# Patient Record
Sex: Female | Born: 1950 | Race: White | Hispanic: No | Marital: Single | Smoking: Former smoker
Health system: Southern US, Community
[De-identification: ages and names within clinical notes are randomized; demographics above are authoritative.]

## PROBLEM LIST (undated history)

## (undated) DIAGNOSIS — N200 Calculus of kidney: Secondary | ICD-10-CM

## (undated) DIAGNOSIS — E785 Hyperlipidemia, unspecified: Secondary | ICD-10-CM

## (undated) DIAGNOSIS — D649 Anemia, unspecified: Secondary | ICD-10-CM

## (undated) DIAGNOSIS — I341 Nonrheumatic mitral (valve) prolapse: Secondary | ICD-10-CM

## (undated) DIAGNOSIS — Z87442 Personal history of urinary calculi: Secondary | ICD-10-CM

## (undated) DIAGNOSIS — I10 Essential (primary) hypertension: Secondary | ICD-10-CM

## (undated) DIAGNOSIS — N189 Chronic kidney disease, unspecified: Secondary | ICD-10-CM

## (undated) DIAGNOSIS — R011 Cardiac murmur, unspecified: Secondary | ICD-10-CM

## (undated) DIAGNOSIS — Z923 Personal history of irradiation: Secondary | ICD-10-CM

## (undated) DIAGNOSIS — T7840XA Allergy, unspecified, initial encounter: Secondary | ICD-10-CM

## (undated) DIAGNOSIS — H269 Unspecified cataract: Secondary | ICD-10-CM

## (undated) DIAGNOSIS — C801 Malignant (primary) neoplasm, unspecified: Secondary | ICD-10-CM

## (undated) DIAGNOSIS — M810 Age-related osteoporosis without current pathological fracture: Secondary | ICD-10-CM

## (undated) HISTORY — DX: Calculus of kidney: N20.0

## (undated) HISTORY — DX: Chronic kidney disease, unspecified: N18.9

## (undated) HISTORY — PX: SHOULDER SURGERY: SHX246

## (undated) HISTORY — DX: Unspecified cataract: H26.9

## (undated) HISTORY — DX: Allergy, unspecified, initial encounter: T78.40XA

## (undated) HISTORY — DX: Anemia, unspecified: D64.9

## (undated) HISTORY — PX: COLONOSCOPY W/ POLYPECTOMY: SHX1380

## (undated) HISTORY — DX: Hyperlipidemia, unspecified: E78.5

## (undated) HISTORY — PX: CYSTOSCOPY: SUR368

## (undated) HISTORY — DX: Age-related osteoporosis without current pathological fracture: M81.0

## (undated) HISTORY — DX: Essential (primary) hypertension: I10

---

## 1998-12-06 ENCOUNTER — Other Ambulatory Visit: Admission: RE | Admit: 1998-12-06 | Discharge: 1998-12-06 | Payer: Self-pay | Admitting: Internal Medicine

## 1999-12-18 ENCOUNTER — Encounter: Payer: Self-pay | Admitting: Internal Medicine

## 1999-12-18 ENCOUNTER — Encounter: Admission: RE | Admit: 1999-12-18 | Discharge: 1999-12-18 | Payer: Self-pay | Admitting: Internal Medicine

## 2000-01-18 ENCOUNTER — Other Ambulatory Visit: Admission: RE | Admit: 2000-01-18 | Discharge: 2000-01-18 | Payer: Self-pay | Admitting: Internal Medicine

## 2000-01-22 ENCOUNTER — Encounter: Payer: Self-pay | Admitting: Internal Medicine

## 2000-01-22 ENCOUNTER — Ambulatory Visit (HOSPITAL_COMMUNITY): Admission: RE | Admit: 2000-01-22 | Discharge: 2000-01-22 | Payer: Self-pay | Admitting: Internal Medicine

## 2000-11-19 ENCOUNTER — Encounter: Payer: Self-pay | Admitting: Orthopedic Surgery

## 2000-11-25 ENCOUNTER — Ambulatory Visit (HOSPITAL_COMMUNITY): Admission: AD | Admit: 2000-11-25 | Discharge: 2000-11-26 | Payer: Self-pay | Admitting: Orthopedic Surgery

## 2000-12-18 ENCOUNTER — Encounter: Admission: RE | Admit: 2000-12-18 | Discharge: 2000-12-18 | Payer: Self-pay | Admitting: Internal Medicine

## 2000-12-18 ENCOUNTER — Encounter: Payer: Self-pay | Admitting: Internal Medicine

## 2000-12-24 ENCOUNTER — Other Ambulatory Visit: Admission: RE | Admit: 2000-12-24 | Discharge: 2000-12-24 | Payer: Self-pay | Admitting: Internal Medicine

## 2001-12-21 ENCOUNTER — Encounter: Admission: RE | Admit: 2001-12-21 | Discharge: 2001-12-21 | Payer: Self-pay | Admitting: Internal Medicine

## 2001-12-21 ENCOUNTER — Encounter: Payer: Self-pay | Admitting: Internal Medicine

## 2001-12-25 ENCOUNTER — Other Ambulatory Visit: Admission: RE | Admit: 2001-12-25 | Discharge: 2001-12-25 | Payer: Self-pay | Admitting: Internal Medicine

## 2002-12-29 ENCOUNTER — Encounter: Admission: RE | Admit: 2002-12-29 | Discharge: 2002-12-29 | Payer: Self-pay | Admitting: Internal Medicine

## 2002-12-29 ENCOUNTER — Encounter: Payer: Self-pay | Admitting: Internal Medicine

## 2003-11-18 ENCOUNTER — Encounter (INDEPENDENT_AMBULATORY_CARE_PROVIDER_SITE_OTHER): Payer: Self-pay | Admitting: *Deleted

## 2003-11-18 ENCOUNTER — Ambulatory Visit (HOSPITAL_COMMUNITY): Admission: RE | Admit: 2003-11-18 | Discharge: 2003-11-18 | Payer: Self-pay | Admitting: *Deleted

## 2003-11-18 ENCOUNTER — Encounter (INDEPENDENT_AMBULATORY_CARE_PROVIDER_SITE_OTHER): Payer: Self-pay | Admitting: Specialist

## 2003-12-15 ENCOUNTER — Other Ambulatory Visit: Admission: RE | Admit: 2003-12-15 | Discharge: 2003-12-29 | Payer: Self-pay | Admitting: Internal Medicine

## 2004-01-10 ENCOUNTER — Ambulatory Visit (HOSPITAL_COMMUNITY): Admission: RE | Admit: 2004-01-10 | Discharge: 2004-01-10 | Payer: Self-pay | Admitting: Internal Medicine

## 2005-01-15 ENCOUNTER — Ambulatory Visit (HOSPITAL_COMMUNITY): Admission: RE | Admit: 2005-01-15 | Discharge: 2005-01-15 | Payer: Self-pay | Admitting: Internal Medicine

## 2005-06-14 ENCOUNTER — Ambulatory Visit (HOSPITAL_COMMUNITY): Admission: RE | Admit: 2005-06-14 | Discharge: 2005-06-14 | Payer: Self-pay | Admitting: *Deleted

## 2005-06-14 ENCOUNTER — Encounter (INDEPENDENT_AMBULATORY_CARE_PROVIDER_SITE_OTHER): Payer: Self-pay | Admitting: *Deleted

## 2005-06-14 ENCOUNTER — Encounter (INDEPENDENT_AMBULATORY_CARE_PROVIDER_SITE_OTHER): Payer: Self-pay | Admitting: Specialist

## 2006-01-16 ENCOUNTER — Ambulatory Visit (HOSPITAL_COMMUNITY): Admission: RE | Admit: 2006-01-16 | Discharge: 2006-01-16 | Payer: Self-pay | Admitting: Internal Medicine

## 2006-01-27 ENCOUNTER — Encounter: Admission: RE | Admit: 2006-01-27 | Discharge: 2006-01-27 | Payer: Self-pay | Admitting: Internal Medicine

## 2007-01-16 ENCOUNTER — Other Ambulatory Visit: Admission: RE | Admit: 2007-01-16 | Discharge: 2007-01-16 | Payer: Self-pay | Admitting: Internal Medicine

## 2007-01-23 ENCOUNTER — Encounter: Admission: RE | Admit: 2007-01-23 | Discharge: 2007-01-23 | Payer: Self-pay | Admitting: Internal Medicine

## 2007-01-30 ENCOUNTER — Ambulatory Visit (HOSPITAL_COMMUNITY): Admission: RE | Admit: 2007-01-30 | Discharge: 2007-01-30 | Payer: Self-pay | Admitting: Internal Medicine

## 2008-02-16 ENCOUNTER — Ambulatory Visit (HOSPITAL_COMMUNITY): Admission: RE | Admit: 2008-02-16 | Discharge: 2008-02-16 | Payer: Self-pay | Admitting: Internal Medicine

## 2009-02-16 ENCOUNTER — Ambulatory Visit (HOSPITAL_COMMUNITY): Admission: RE | Admit: 2009-02-16 | Discharge: 2009-02-16 | Payer: Self-pay | Admitting: Internal Medicine

## 2009-10-31 ENCOUNTER — Ambulatory Visit: Payer: Self-pay | Admitting: Internal Medicine

## 2009-10-31 DIAGNOSIS — K219 Gastro-esophageal reflux disease without esophagitis: Secondary | ICD-10-CM | POA: Insufficient documentation

## 2009-11-03 ENCOUNTER — Ambulatory Visit: Payer: Self-pay | Admitting: Internal Medicine

## 2009-11-07 ENCOUNTER — Encounter: Payer: Self-pay | Admitting: Internal Medicine

## 2009-11-08 ENCOUNTER — Encounter: Payer: Self-pay | Admitting: Internal Medicine

## 2010-02-20 ENCOUNTER — Ambulatory Visit (HOSPITAL_COMMUNITY): Admission: RE | Admit: 2010-02-20 | Discharge: 2010-02-20 | Payer: Self-pay | Admitting: Internal Medicine

## 2010-07-22 ENCOUNTER — Encounter: Payer: Self-pay | Admitting: Internal Medicine

## 2010-08-02 NOTE — Letter (Signed)
Summary: EGD Instructions  [facility]  [address] [place], [place] [address]   Phone: [phone]  Fax: [phone]       Christina Sexton    [birth year]    MRN: [id number]       Procedure Day /Date: 11/03/09 Friday     Arrival Time: 2:30 pm     Procedure Time: 3:30 pm     Location of Procedure:                    _ x _  [facility] Center (4th Floor)  PREPARATION FOR ENDOSCOPY   On 11/03/09 THE DAY OF THE PROCEDURE:  1.   No solid foods, milk or milk products are allowed after midnight the night before your procedure.  2.   Do not drink anything colored red or purple.  Avoid juices with pulp.  No orange juice.  3.  You may drink clear liquids until 1:30 pm, which is 2 hours before your procedure.                                                                                                CLEAR LIQUIDS INCLUDE: Water Jello Ice Popsicles Tea (sugar ok, no milk/cream) Powdered fruit flavored drinks Coffee (sugar ok, no milk/cream) Gatorade Juice: apple, white grape, white cranberry  Lemonade Clear bullion, consomm, broth Carbonated beverages (any kind) Strained chicken noodle soup Hard Candy   MEDICATION INSTRUCTIONS  Unless otherwise instructed, you should take regular prescription medications with a small sip of water as early as possible the morning of your procedure.                  OTHER INSTRUCTIONS  You will need a responsible adult at least 59 years of age to accompany you and drive you home.   This person must remain in the waiting room during your procedure.  Wear loose fitting clothing that is easily removed.  Leave jewelry and other valuables at home.  However, you may wish to bring a book to read or an iPod/MP3 player to listen to music as you wait for your procedure to start.  Remove all body piercing jewelry and leave at home.  Total time from sign-in until discharge is approximately 2-3 hours.  You should go  home directly after your procedure and rest.  You can resume normal activities the day after your procedure.  The day of your procedure you should not:   Drive   Make legal decisions   Operate machinery   Drink alcohol   Return to work  You will receive specific instructions about eating, activities and medications before you leave.    The above instructions have been reviewed and explained to me by  Lamona Curl CMA Duncan Dull)  Oct 31, 2009 1:52 PM     I fully understand and can verbalize these instructions _____________________________ Date 10/31/09

## 2010-08-02 NOTE — Procedures (Signed)
Summary: Upper Endoscopy  Patient: Christina Sexton Note: All result statuses are Final unless otherwise noted.  Tests: (1) Upper Endoscopy (EGD)   EGD Upper Endoscopy       DONE     [facility]     [address]     [place], [place]  [address]           ENDOSCOPY PROCEDURE REPORT           PATIENT:  Christina Sexton, Christina Sexton  MR#:  [id number]     BIRTHDATE:  [birth year], 58 yrs. old  GENDER:  female           ENDOSCOPIST:  Hedwig Morton. Juanda Chance, MD     Referred by:  Soyla Murphy. Renne Crigler, M.D.           PROCEDURE DATE:  11/03/2009     PROCEDURE:  EGD with biopsy     ASA CLASS:  Class I     INDICATIONS:  h/o Barrett's Esophagus 10/2003 Barrett's, EGD 2006     no Barrett's,     now burning in her throat, off PPI's           MEDICATIONS:   Versed 6 mg, Fentanyl 75 mcg     TOPICAL ANESTHETIC:  Exactacain Spray           DESCRIPTION OF PROCEDURE:   After the risks benefits and     alternatives of the procedure were thoroughly explained, informed     consent was obtained.  The [employer] GIF-H180 [id number] endoscope was     introduced through the mouth and advanced to the second portion of     the duodenum, without limitations.  The instrument was slowly     withdrawn as the mucosa was fully examined.     <<PROCEDUREIMAGES>>           irregular Z-line. With standard forceps, a biopsy was obtained and     sent to pathology (see image1, image8, image9, and image7).  Mild     gastritis was found. With standard forceps, a biopsy was obtained     and sent to pathology (see image6, image5, image4, and image3).     soft nodular folds  Otherwise the examination was normal. small     bowl biopsies With standard forceps, a biopsy was obtained and     sent to pathology (see image2).    Retroflexed views revealed no     abnormalities.    The scope was then withdrawn from the patient     and the procedure completed.           COMPLICATIONS:  None           ENDOSCOPIC IMPRESSION:     1) Irregular Z-line     2)  Mild gastritis     3) Otherwise normal examination     RECOMMENDATIONS:     1) Await biopsy results     Nexiem 40 mg 1 po qd, #30, 3 refills           REPEAT EXAM:  In 2 year(s) for.  2 years for recall if Barrett's     found           ______________________________     Hedwig Morton. Juanda Chance, MD           CC:           n.     eSIGNED:   Hedwig Morton. Tito Ausmus at 11/03/2009 03:55 PM  Christina Sexton, Christina Sexton, [id number]  Note: An exclamation mark (!) indicates a result that was not dispersed into the flowsheet. Document Creation Date: 11/03/2009 3:56 PM _______________________________________________________________________  (1) Order result status: Final Collection or observation date-time: 11/03/2009 15:43 Requested date-time:  Receipt date-time:  Reported date-time:  Referring Physician:   Ordering Physician: Lina Sar [phone]) Specimen Source:  Source: Launa Grill Order Number: [id number] Lab site:

## 2010-08-02 NOTE — Op Note (Signed)
Summary: EGD (Dr Virginia Rochester)  NAME:  Christina Sexton, Christina Sexton                        ACCOUNT NO.:  [id number]   MEDICAL RECORD NO.:  [id number]                   PATIENT TYPE:  AMB   LOCATION:  ENDO                                 FACILITY:  [facility]   PHYSICIAN:  Georgiana Spinner, M.D.                 DATE OF BIRTH:  [birth year]   DATE OF PROCEDURE:  11/18/2003  DATE OF DISCHARGE:                                 OPERATIVE REPORT   PROCEDURE:  Upper endoscopy with biopsy.   INDICATIONS:  GERD.   ANESTHESIA:  Demerol 60 mg, Versed \\78  mg.   DESCRIPTION OF PROCEDURE:  With the patient mildly sedated in the left  lateral decubitus position, the Olympus videoscopic endoscope was inserted  in the mouth, passed under direct vision through the esophagus, which  appeared normal.  As we approached the distal esophagus, Z-line became  apparent, and we photographed and biopsied this to rule out Barrett's  esophagus.  The endoscope was advanced into the stomach.  Fundus, body,  antrum, duodenal bulb, second portion of duodenum were visualized.  From  this point, the endoscope was slowly withdrawn, taking circumferential views  of the duodenal mucosa until the endoscope then pulled back into the  stomach, placed in retroflexion to view the stomach from below, and a laxity  of the GE junction was noted and photographed.  The endoscope was  straightened and withdrawn, taking circumferential views of the remaining  gastric and esophageal mucosa, stopping in the fundus of the stomach which  appeared somewhat velvety in appearance and was photographed and biopsied.  The endoscope was withdrawn, taking circumferential views of the remaining  gastric and esophageal mucosa.  The patient's vital signs and pulse oximeter  remained stable.  The patient tolerated the procedure well without apparent  complications.   FINDINGS:  1. Questionable changes of Barrett's esophagus in the distal esophagus.  2. Changes of fundus.  3. Await biopsy report.  The patient will call me for results and follow up     with me as an outpatient.  4. Proceed to colonoscopy as planned.                                               Georgiana Spinner, M.D.    GMO/MEDQ  D:  11/18/2003  T:  11/18/2003  Job:  [id number]

## 2010-08-02 NOTE — Op Note (Signed)
Summary: COLON (Dr Virginia Rochester)  NAME:  Christina Sexton, Christina Sexton                        ACCOUNT NO.:  [id number]   MEDICAL RECORD NO.:  [id number]                   PATIENT TYPE:  AMB   LOCATION:  ENDO                                 FACILITY:  [facility]   PHYSICIAN:  Georgiana Spinner, M.D.                 DATE OF BIRTH:  01-26-1951   DATE OF PROCEDURE:  11/18/2003  DATE OF DISCHARGE:                                 OPERATIVE REPORT   PROCEDURE:  Colonoscopy.   INDICATIONS FOR PROCEDURE:  Colon cancer screening.   ANESTHESIA:  Demerol 40, Versed 1 mg.   DESCRIPTION OF PROCEDURE:  With the patient mildly sedated in the left  lateral decubitus position, the Olympus videoscopic pediatric colonoscope  PCF-100 was inserted in the rectum and passed under direct vision through a  tortuous distal colon. With pressure applied, we reached the cecum  identified by the ileocecal valve and appendiceal orifice both of which were  photographed. From this point, the colonoscope was slowly withdrawn taking  circumferential views of the colonic mucosa stopping only in the rectum  which appeared normal on direct showed hemorrhoids on retroflexed view. The  endoscope was straightened and withdrawn. The patient's vital signs and  pulse oximeter remained stable. The patient tolerated the procedure well  without apparent complications.   FINDINGS:  Internal hemorrhoids otherwise unremarkable but tortuous colon.   PLAN:  Consider repeat examination in 5-10 years.                                               Georgiana Spinner, M.D.    GMO/MEDQ  D:  11/18/2003  T:  11/18/2003  Job:  [id number]

## 2010-08-02 NOTE — Op Note (Signed)
Summary: EGD   NAME:  Christina Sexton, Christina Sexton              ACCOUNT NO.:  [id number]   MEDICAL RECORD NO.:  [id number]          PATIENT TYPE:  AMB   LOCATION:  ENDO                         FACILITY:  [facility]   PHYSICIAN:  Georgiana Spinner, M.D.    DATE OF BIRTH:  December 26, 1950   DATE OF PROCEDURE:  06/14/2005  DATE OF DISCHARGE:                                 OPERATIVE REPORT   PROCEDURE:  Upper endoscopy.   INDICATIONS:  GERD with Barrett's esophagus.   ANESTHESIA:  Demerol 50, Versed 5 mg.   PROCEDURE:  With the patient mildly sedated in the left lateral decubitus  position, the Olympus videoscopic endoscope was inserted in the mouth and  passed under direct vision through the esophagus which appeared normal until  we reached distal esophagus, and there were changes of Barrett's,  photographed and biopsied.  We entered into the stomach.  Fundus, body,  antrum, duodenal bulb, second portion of duodenum were visualized.  From  this point, the endoscope was slowly withdrawn taking circumferential views  of duodenal mucosa until the endoscope had been pulled back in the stomach,  placed in retroflexion to view the stomach from below.  The endoscope was  straightened and withdrawn, taking circumferential views of the remaining  gastric and esophageal mucosa.  The patient's vital signs and pulse oximeter  remained stable.  The patient tolerated the procedure well without apparent  complications.   FINDINGS:  Barrett's esophagus, biopsied, await biopsy report.  The patient  will call me for results and follow up with me as an outpatient.           ______________________________  Georgiana Spinner, M.D.     GMO/MEDQ  D:  06/14/2005  T:  06/15/2005  Job:  [id number]     FINAL DIAGNOSIS    ***MICROSCOPIC EXAMINATION AND DIAGNOSIS***    ESOPHAGUS, BIOPSIES: GASTROESOPHAGEAL JUNCTION MUCOSA WITH MILD   INFLAMMATION CONSISTENT WITH GASTROESOPHAGEAL REFLUX. NO   INTESTINAL METAPLASIA, DYSPLASIA OR  MALIGNANCY IDENTIFIED.    COMMENT   An Alcian Blue stain is performed to determine the presence of   intestinal metaplasia (goblet cell metaplasia). No intestinal   metaplasia (goblet cell metaplasia) is identified with the Alcian   Blue stain. The control stained appropriately. (JDP:glk   06-17-05)    gk   Date Reported: 06/17/2005 Beulah Gandy. Luisa Hart, MD   *** Electronically Signed Out By JDP ***    Clinical information   History of Barrett' s. (hd)    specimen(s) obtained   Esophagus, biopsy    Gross Description   Received in formalin are tan, soft tissue fragments that are   submitted in toto. Number: 5   Size: 0.1 to 0.2 cm (JBM:kcv 06-14-05)    kv/

## 2010-08-02 NOTE — Assessment & Plan Note (Signed)
Summary: burning in her throat..em   History of Present Illness Visit Type: Initial Visit Primary GI MD: Lina Sar MD Primary Provider: Merri Brunette, MD Chief Complaint: Burning in throat x 2 months off/on History of Present Illness:   This is a 60 year old white female with chronic gastroesophageal reflux and a history of Barrett's esophagus found on an upper endoscopy in May 2005 by Dr Virginia Rochester. A repeat upper endoscopy on 08/15/04 did not show any evidence of Barrett's esophagus but rather showed reflux esophagitis. Patient has discontinued all acid reducers and has been experiencing burning in the back of her throat. Her symptoms are somewhat different than the chest pain she was having in the past. She denies dysphagia but has occasional hoarseness. She denies coughing. Most of the symptoms occurr during the day. Her weight has been stable. She does not smoke and does not drink alcohol. She drinks only one cup of coffee per day.   GI Review of Systems    Reports acid reflux and  heartburn.      Denies abdominal pain, belching, bloating, chest pain, dysphagia with liquids, dysphagia with solids, loss of appetite, nausea, vomiting, vomiting blood, weight loss, and  weight gain.        Denies anal fissure, black tarry stools, change in bowel habit, constipation, diarrhea, diverticulosis, fecal incontinence, heme positive stool, hemorrhoids, irritable bowel syndrome, jaundice, light color stool, liver problems, rectal bleeding, and  rectal pain. Preventive Screening-Counseling & Management  Alcohol-Tobacco     Smoking Status: never  Caffeine-Diet-Exercise     Does Patient Exercise: yes      Drug Use:  no.      Current Medications (verified): 1)  None  Allergies (verified): No Known Drug Allergies  Past History:  Past Medical History: Barretts Esophagus GERD Arrhythmia Kidney Stones  Past Surgical History: Shoulder Surgery  Family History: No FH of Colon Cancer: Family  History of Diabetes: Father Family History of Heart Disease: Father Family History of Kidney Disease:Brother  Social History: Occupation: [employer] Patient has never smoked.  Alcohol Use - no Daily Caffeine Use Illicit Drug Use - no Patient gets regular exercise. Smoking Status:  never Drug Use:  no Does Patient Exercise:  yes  Review of Systems       The patient complains of allergy/sinus, anxiety-new, heart rhythm changes, night sweats, and sore throat.  The patient denies anemia, arthritis/joint pain, back pain, blood in urine, breast changes/lumps, change in vision, confusion, cough, coughing up blood, depression-new, fainting, fatigue, fever, headaches-new, hearing problems, heart murmur, itching, menstrual pain, muscle pains/cramps, nosebleeds, pregnancy symptoms, shortness of breath, skin rash, sleeping problems, swelling of feet/legs, swollen lymph glands, thirst - excessive, urination - excessive, urination changes/pain, urine leakage, vision changes, and voice change.         Pertinent positive and negative review of systems were noted in the above HPI. All other ROS was otherwise negative.   Vital Signs:  Patient profile:   60 year old female Height:      63 inches Weight:      103 pounds BMI:     18.31 Pulse rate:   80 / minute Pulse rhythm:   regular BP sitting:   146 / 82  (left arm) Cuff size:   regular  Vitals Entered By: June McMurray CMA Duncan Dull) (Oct 31, 2009 1:19 PM)  Physical Exam  General:  Well developed, well nourished, no acute distress. Eyes:  PERRLA, no icterus. Mouth:  No deformity or lesions, dentition normal.  Neck:  Supple; no masses or thyromegaly. Chest Wall:  no tenderness of costochondral junctions. Lungs:  Clear throughout to auscultation. Heart:  Regular rate and rhythm; no murmurs, rubs,  or bruits. Abdomen:  Soft, nontender and nondistended. No masses, hepatosplenomegaly or hernias noted. Normal bowel sounds. Extremities:  No clubbing,  cyanosis, edema or deformities noted. Skin:  Intact without significant lesions or rashes. Psych:  Alert and cooperative. Normal mood and affect.   Impression & Recommendations:  Problem # 1:  GERD (ICD-530.81)  Patient has intermittent burning in the back of her throat. This could be a result of gastroesophageal reflux, allergies or possibly globus sensation. We will proceed with an upper endoscopy to clarify the question of Barrett's esophagus. We will start Nexium 40 mg daily for 6 weeks. We will wait until the results of the upper endoscopy before starting her PPI.  Orders: EGD (EGD)  Problem # 2:  SPECIAL SCREENING FOR MALIGNANT NEOPLASMS COLON (ICD-V76.51) Patient's last colonoscopy by Dr.Orr inMay 2005 was normal except for internal hemorrhoids. A recall colonoscopy will be due in May 2015.  Patient Instructions: 1)  upper endoscopy. 2)  Hold PPIs until after upper endoscopy. 3)  Recall colonoscopy May 2015. 4)  The medication list was reviewed and reconciled.  All changed / newly prescribed medications were explained.  A complete medication list was provided to the patient / caregiver.

## 2010-08-02 NOTE — Letter (Signed)
Summary: Patient [name] Biopsy Results  [facility]  [address] [place], [place] [address]   Phone: [phone]  Fax: [phone]        Nov 07, 2009 MRN: [id number]    Christina Sexton [address] [place], [place]  [address]    Dear Ms. Massoud,  I am pleased to inform you that the biopsies taken during your recent endoscopic examination did not show any evidence of cancer upon pathologic examination.There is again , no evidence for Barrett's esophagus.  Additional information/recommendations:  __No further action is needed at this time.  Please follow-up with      your primary care physician for your other healthcare needs.  __ Please call [phone] to schedule a return visit to review      your condition.  __x Continue with the treatment plan as outlined on the day of your      exam.  _   Please call us if you are having persistent problems or have questions about your condition that have not been fully answered at this time.  Sincerely,  Hart Carwin MD  This letter has been electronically signed by your physician.  Appended Document: Patient Notice-Endo Biopsy Results letter mailed 5.11.11

## 2010-08-02 NOTE — Miscellaneous (Signed)
Summary: Change PPI due to [employer] company will not approve Nexium since patient has never tried any other PPI's. We will try her on omeprazole 40 mg daily and she will call us back if she does not have any relief of her symptoms. Christina Sexton CMA Duncan Dull)  Nov 08, 2009 3:54 PM    Clinical Lists Changes  Medications: Changed medication from NEXIUM 40 MG  CPDR (ESOMEPRAZOLE MAGNESIUM) 1 capsule each day 30 minutes before meal to OMEPRAZOLE 40 MG CPDR (OMEPRAZOLE) Take 1 tablet by mouth 30 minutes before breakfast (pharmacy-please d/c prescription for nexium...insurance will not cover) - Signed Rx of OMEPRAZOLE 40 MG CPDR (OMEPRAZOLE) Take 1 tablet by mouth 30 minutes before breakfast (pharmacy-please d/c prescription for nexium...insurance will not cover);  #30 x 4;  Signed;  Entered by: Lamona Curl CMA (AAMA);  Authorized by: Hart Carwin MD;  Method used: Electronically to [facility]  [address] [phone]*, [address], [place], [place]  [address], Ph: [phone] or [phone], Fax: [phone]    Prescriptions: OMEPRAZOLE 40 MG CPDR (OMEPRAZOLE) Take 1 tablet by mouth 30 minutes before breakfast (pharmacy-please d/c prescription for nexium...insurance will not cover)  #30 x 4   Entered by:   Lamona Curl CMA (AAMA)   Authorized by:   Hart Carwin MD   Signed by:   Lamona Curl CMA (AAMA) on 11/08/2009   Method used:   Electronically to        [facility]  [address] [phone]* (retail)       [address]       [place], [place]  [address]       Ph: [phone] or [phone]       Fax: [phone]   RxID:   [id number]

## 2010-08-02 NOTE — Miscellaneous (Signed)
Summary: RX nexium  Clinical Lists Changes  Medications: Added new medication of NEXIUM 40 MG  CPDR (ESOMEPRAZOLE MAGNESIUM) 1 capsule each day 30 minutes before meal - Signed Rx of NEXIUM 40 MG  CPDR (ESOMEPRAZOLE MAGNESIUM) 1 capsule each day 30 minutes before meal;  #30 x 3;  Signed;  Entered by: Sherren Kerns RN;  Authorized by: Hart Carwin MD;  Method used: Electronically to [facility]  [address] [phone]*, [address], [place], [place]  [address], Ph: [phone] or [phone], Fax: [phone] Observations: Added new observation of ALLERGY REV: Done (11/03/2009 16:20)    Prescriptions: NEXIUM 40 MG  CPDR (ESOMEPRAZOLE MAGNESIUM) 1 capsule each day 30 minutes before meal  #30 x 3   Entered by:   Sherren Kerns RN   Authorized by:   Hart Carwin MD   Signed by:   Sherren Kerns RN on 11/03/2009   Method used:   [facility]ronically to        [facility]  [address] [phone]* (retail)       [address]       [place], [place]  [address]       Ph: [phone] or [phone]       Fax: [phone]   RxID:   [id number]

## 2010-11-16 NOTE — Op Note (Signed)
Christina Sexton, Christina Sexton              ACCOUNT NO.:  [id number]   MEDICAL RECORD NO.:  [id number]          PATIENT TYPE:  AMB   LOCATION:  ENDO                         [facility]ACILITY[facility]  [facility]   PHYSICIAN:  Georgiana Spinner, M.D.    DATE OF BIRTH:  Sep 26, 1950   DATE OF PROCEDURE:  06/14/2005  DATE OF DISCHARGE:                                 OPERATIVE REPORT   PROCEDURE:  Upper endoscopy.   INDICATIONS:  GERD with Barrett's esophagus.   ANESTHESIA:  Demerol 50, Versed 5 mg.   PROCEDURE:  With the patient mildly sedated in the left lateral decubitus  position, the Olympus videoscopic endoscope was inserted in the mouth and  passed under direct vision through the esophagus which appeared normal until  we reached distal esophagus, and there were changes of Barrett's,  photographed and biopsied.  We entered into the stomach.  Fundus, body,  antrum, duodenal bulb, second portion of duodenum were visualized.  From  this point, the endoscope was slowly withdrawn taking circumferential views  of duodenal mucosa until the endoscope had been pulled back in the stomach,  placed in retroflexion to view the stomach from below.  The endoscope was  straightened and withdrawn, taking circumferential views of the remaining  gastric and esophageal mucosa.  The patient's vital signs and pulse oximeter  remained stable.  The patient tolerated the procedure well without apparent  complications.   FINDINGS:  Barrett's esophagus, biopsied, await biopsy report.  The patient  will call me for results and follow up with me as an outpatient.           ______________________________  Georgiana Spinner, M.D.     GMO/MEDQ  D:  06/14/2005  T:  06/15/2005  Job:  [id number]

## 2010-11-16 NOTE — Op Note (Signed)
NAME:  Christina Sexton, Christina Sexton NO.:  [id number]   MEDICAL RECORD NO.:  [id number]                   PATIENT TYPE:  AMB   LOCATION:  ENDO                                 FACILITY:  [facility]   PHYSICIAN:  Georgiana Spinner, M.D.                 DATE OF BIRTH:  Nov 18, 1950   DATE OF PROCEDURE:  11/18/2003  DATE OF DISCHARGE:                                 OPERATIVE REPORT   PROCEDURE:  Upper endoscopy with biopsy.   INDICATIONS:  GERD.   ANESTHESIA:  Demerol 60 mg, Versed \\78  mg.   DESCRIPTION OF PROCEDURE:  With the patient mildly sedated in the left  lateral decubitus position, the Olympus videoscopic endoscope was inserted  in the mouth, passed under direct vision through the esophagus, which  appeared normal.  As we approached the distal esophagus, Z-line became  apparent, and we photographed and biopsied this to rule out Barrett's  esophagus.  The endoscope was advanced into the stomach.  Fundus, body,  antrum, duodenal bulb, second portion of duodenum were visualized.  From  this point, the endoscope was slowly withdrawn, taking circumferential views  of the duodenal mucosa until the endoscope then pulled back into the  stomach, placed in retroflexion to view the stomach from below, and a laxity  of the GE junction was noted and photographed.  The endoscope was  straightened and withdrawn, taking circumferential views of the remaining  gastric and esophageal mucosa, stopping in the fundus of the stomach which  appeared somewhat velvety in appearance and was photographed and biopsied.  The endoscope was withdrawn, taking circumferential views of the remaining  gastric and esophageal mucosa.  The patient's vital signs and pulse oximeter  remained stable.  The patient tolerated the procedure well without apparent  complications.   FINDINGS:  1. Questionable changes of Barrett's esophagus in the distal esophagus.  2. Changes of fundus.  3. Await biopsy report.   The patient will call me for results and follow up     with me as an outpatient.  4. Proceed to colonoscopy as planned.                                               Georgiana Spinner, M.D.    GMO/MEDQ  D:  11/18/2003  T:  11/18/2003  Job:  [id number]

## 2010-11-16 NOTE — Op Note (Signed)
. [facility]  Patient:    Christina Sexton, Christina Sexton                     MRN: [id number] Proc. Date: 11/25/00 Adm. Date:  [id number] Attending:  Marlowe Kays Page                           Operative Report  PREOPERATIVE DIAGNOSES: 1. Adhesive capsulitis. 2. Degenerative labral tear. 3. 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  POSTOPERATIVE DIAGNOSES: 1. Adhesive capsulitis. 2. Degenerative labral tear. 3. 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  PROCEDURES: 1. Closed manipulation, right shoulder. 2. Right shoulder arthroscopy with debridement of labrum, right shoulder. 3. Arthroscopic subacromial decompression, right shoulder.  SURGEON:  Illene Labrador. [place], M.D.  ASSISTANT:  Dorie Rank, P.A., for procedures #2 and 3.  ANESTHESIA:  General.  PATHOLOGY AND INDICATION FOR PROCEDURE:  Chronic pain and loss of motion of the right shoulder with plain x-ray showing a large subacromial spur and minimal subacromial outlet, and an MRI demonstrating a small labral degenerative-type tear at the superior portion with no rotator cuff tear. These findings were confirmed during surgery.  DESCRIPTION OF PROCEDURE:  Satisfactory general anesthesia.  I first checked her motion.  I could abduct her to about 75 degrees, flex her to about the same, internally and externally rotate her to about 90 degrees.  We then began gentle manipulation of the right shoulder with audible and palpable lysis of adhesions, particularly going from flexion to extension.  When I finished the manipulation, she had full range of shoulder motion passively.  Then she was on the Schlein frame at this time and then brought her up into beach chair position and prepped the shoulder girdle with Duraprep, draped in a sterile field, marked out the shoulder anatomy, including the posterior soft spot and lateral  portals as well as the coracoid and the [facility] joint.  Through a posterior soft spot portal, I atraumatically entered the glenohumeral joint.  There was a hemorrhage there from the manipulation.  The long head of the biceps tendon was intact, as was the underneath surface of the rotator cuff, and pictures were taken.  The humeral head looked normal.  There was some mild degenerative-type damage to the superior labrum as depicted in the MRI, but really nothing that looked significantly altered.  Because of the hemorrhage and the small degenerative-type process in the labrum, I elected to go ahead and debride the joint.  I advanced the scope between the biceps and subscapularis and used the switching stick and over the switching stick placed cannula for the 4.2 shaver, which I introduced, and then shaved down the labrum and removed the clotted material, with final pictures being taken.  I then redirected the scope into the subacromial area and made a lateral portal and used the 4.2 shaver to remove a fairly exuberant subdeltoid bursa.  My initial picture was taken showing very little clearance between the surface of the acromion and the rotator cuff.  I then used the Arthrocare appliance to vaporize and also cauterize soft tissue from around the acromion.  I then introduced a 4.0 bur and began burring down the underneath surface of the acromion and went back and forth between the bur and the Arthrocare, vaporizing until we had nice subacromial clearance as documented by pictures, both with the arm to the  side and arm abducted.  All bleeders were dry at the time of conclusion of the case.  Then removed fluid from subacromial bursa and space and closed the three portals with 4-0 nylon.  Then we infiltrated them with 0.5% Marcaine with adrenalin and the same with the subacromial space, which I had also done prior to the procedure.  Betadine and Adaptic dry sterile dressing and a sling were applied.   She tolerated the procedure well and was taken to the recovery room in satisfactory condition with no known complications. DD:  11/25/00 TD:  11/25/00 Job: [id number]

## 2010-11-16 NOTE — Op Note (Signed)
NAME:  Christina Sexton, Christina Sexton NO.:  [id number]   MEDICAL RECORD NO.:  [id number]                   PATIENT TYPE:  AMB   LOCATION:  ENDO                                 [facility]ACILITY:  [facility]   PHYSICIAN:  Georgiana Spinner, M.D.                 DATE OF BIRTH:  1951-02-06   DATE OF PROCEDURE:  11/18/2003  DATE OF DISCHARGE:                                 OPERATIVE REPORT   PROCEDURE:  Colonoscopy.   INDICATIONS FOR PROCEDURE:  Colon cancer screening.   ANESTHESIA:  Demerol 40, Versed 1 mg.   DESCRIPTION OF PROCEDURE:  With the patient mildly sedated in the left  lateral decubitus position, the Olympus videoscopic pediatric colonoscope  PCF-100 was inserted in the rectum and passed under direct vision through a  tortuous distal colon. With pressure applied, we reached the cecum  identified by the ileocecal valve and appendiceal orifice both of which were  photographed. From this point, the colonoscope was slowly withdrawn taking  circumferential views of the colonic mucosa stopping only in the rectum  which appeared normal on direct showed hemorrhoids on retroflexed view. The  endoscope was straightened and withdrawn. The patient's vital signs and  pulse oximeter remained stable. The patient tolerated the procedure well  without apparent complications.   FINDINGS:  Internal hemorrhoids otherwise unremarkable but tortuous colon.   PLAN:  Consider repeat examination in 5-10 years.                                               Georgiana Spinner, M.D.    GMO/MEDQ  D:  11/18/2003  T:  11/18/2003  Job:  [id number]

## 2011-01-17 ENCOUNTER — Other Ambulatory Visit (HOSPITAL_COMMUNITY): Payer: Self-pay | Admitting: Internal Medicine

## 2011-01-17 DIAGNOSIS — Z1231 Encounter for screening mammogram for malignant neoplasm of breast: Secondary | ICD-10-CM

## 2011-02-25 ENCOUNTER — Ambulatory Visit (HOSPITAL_COMMUNITY)
Admission: RE | Admit: 2011-02-25 | Discharge: 2011-02-25 | Disposition: A | Discharge status: Home / Self Care | Payer: BC Managed Care – PPO | Source: Ambulatory Visit | Attending: Internal Medicine | Admitting: Internal Medicine

## 2011-02-25 DIAGNOSIS — Z1231 Encounter for screening mammogram for malignant neoplasm of breast: Secondary | ICD-10-CM | POA: Insufficient documentation

## 2011-06-24 ENCOUNTER — Encounter: Payer: Self-pay | Admitting: Family Medicine

## 2011-06-24 ENCOUNTER — Emergency Department (HOSPITAL_COMMUNITY)
Admission: EM | Admit: 2011-06-24 | Discharge: 2011-06-24 | Disposition: A | Discharge status: Home / Self Care | Payer: BC Managed Care – PPO | Attending: Emergency Medicine | Admitting: Emergency Medicine

## 2011-06-24 ENCOUNTER — Emergency Department (HOSPITAL_COMMUNITY): Payer: BC Managed Care – PPO

## 2011-06-24 DIAGNOSIS — R6889 Other general symptoms and signs: Secondary | ICD-10-CM

## 2011-06-24 DIAGNOSIS — R05 Cough: Secondary | ICD-10-CM | POA: Insufficient documentation

## 2011-06-24 DIAGNOSIS — IMO0001 Reserved for inherently not codable concepts without codable children: Secondary | ICD-10-CM | POA: Insufficient documentation

## 2011-06-24 DIAGNOSIS — J449 Chronic obstructive pulmonary disease, unspecified: Secondary | ICD-10-CM | POA: Insufficient documentation

## 2011-06-24 DIAGNOSIS — R51 Headache: Secondary | ICD-10-CM | POA: Insufficient documentation

## 2011-06-24 DIAGNOSIS — R509 Fever, unspecified: Secondary | ICD-10-CM | POA: Insufficient documentation

## 2011-06-24 DIAGNOSIS — R059 Cough, unspecified: Secondary | ICD-10-CM | POA: Insufficient documentation

## 2011-06-24 DIAGNOSIS — R42 Dizziness and giddiness: Secondary | ICD-10-CM | POA: Insufficient documentation

## 2011-06-24 DIAGNOSIS — I059 Rheumatic mitral valve disease, unspecified: Secondary | ICD-10-CM | POA: Insufficient documentation

## 2011-06-24 DIAGNOSIS — R110 Nausea: Secondary | ICD-10-CM | POA: Insufficient documentation

## 2011-06-24 DIAGNOSIS — J4489 Other specified chronic obstructive pulmonary disease: Secondary | ICD-10-CM | POA: Insufficient documentation

## 2011-06-24 HISTORY — DX: Nonrheumatic mitral (valve) prolapse: I34.1

## 2011-06-24 LAB — CBC
Hemoglobin: 12.7 g/dL (ref 12.0–15.0)
MCHC: 34.2 g/dL (ref 30.0–36.0)
Platelets: 90 10*3/uL — ABNORMAL LOW (ref 150–400)
RDW: 12.5 % (ref 11.5–15.5)
WBC: 2.3 10*3/uL — ABNORMAL LOW (ref 4.0–10.5)

## 2011-06-24 LAB — POCT I-STAT, CHEM 8
Calcium, Ion: 1 mmol/L — ABNORMAL LOW (ref 1.12–1.32)
Creatinine, Ser: 0.7 mg/dL (ref 0.50–1.10)
Glucose, Bld: 98 mg/dL (ref 70–99)
Potassium: 4.9 mEq/L (ref 3.5–5.1)
Sodium: 141 mEq/L (ref 135–145)

## 2011-06-24 MED ORDER — ONDANSETRON 8 MG PO TBDP
8.0000 mg | ORAL_TABLET | Freq: Once | ORAL | Status: AC
  Administered 2011-06-24: 8 mg via ORAL
  Filled 2011-06-24: qty 1

## 2011-06-24 MED ORDER — ONDANSETRON HCL 4 MG/2ML IJ SOLN
4.0000 mg | Freq: Once | INTRAMUSCULAR | Status: AC
  Administered 2011-06-24: 4 mg via INTRAVENOUS
  Filled 2011-06-24: qty 2

## 2011-06-24 MED ORDER — ONDANSETRON HCL 4 MG PO TABS: 4.0000 mg | ORAL_TABLET | Freq: Four times a day (QID) | ORAL | Status: AC

## 2011-06-24 MED ORDER — SODIUM CHLORIDE 0.9 % IV BOLUS (SEPSIS)
1000.0000 mL | Freq: Once | INTRAVENOUS | Status: AC
  Administered 2011-06-24: 1000 mL via INTRAVENOUS

## 2011-06-24 NOTE — ED Notes (Signed)
Still with ivf infusing .  Patient wants to go home. Friend at bs

## 2011-06-24 NOTE — ED Notes (Signed)
Work excuse given for 2 days

## 2011-06-24 NOTE — ED Notes (Signed)
Pt's friend at bedside states that pt has been having sore throat, coughing, weakness, fever, nausea x1 week. NAD

## 2011-06-24 NOTE — Discharge Instructions (Signed)
Your lab work today shows a low platelet count and a low white blood cell count. Call Dr. Melina Modena the day after Christmas and make an appointment for followup as soon as possible. We'll giving Zofran for nausea today use it as directed. Take lots of fluids and stay hydrated. Your chest x-ray showed COPD today.

## 2011-06-24 NOTE — ED Provider Notes (Signed)
History     CSN: [id number]  Arrival date & time 06/24/11  1713   First MD Initiated Contact with Patient 06/24/11 1741      Chief Complaint  Patient presents with  . URI    cough, fever, sinus problems    (Consider location/radiation/quality/duration/timing/severity/associated sxs/prior treatment) Patient is a 60 y.o. female presenting with URI. The history is provided by the patient. No language interpreter was used.  URI The primary symptoms include fever, fatigue, headaches, sore throat, cough, nausea and myalgias. Primary symptoms do not include ear pain, wheezing, abdominal pain or vomiting. The current episode started 3 to 5 days ago. This is a new problem. The problem has been gradually worsening.  Symptoms associated with the illness include chills, plugged ear sensation, congestion and rhinorrhea. The illness is not associated with facial pain or sinus pressure.   Patient here today complaining of upper respiratory symptoms since Thursday or 4 days ago. Started out with sore throat and nausea. Sore throat is better but she also had an episode of dizziness and almost passed out on Thursday. She still has the head and chest congestion today with nausea and some dizziness. Sore throat is better. PCP is Dr. Judithann Graves is. States that she did have a fever earlier today and took ibuprofen at 12 noon. She does not smoke. Past Medical History  Diagnosis Date  . Mitral valve prolapse   . Asthma     Past Surgical History  Procedure Date  . Shoulder surgery     History reviewed. No pertinent family history.  History  Substance Use Topics  . Smoking status: Former Games developer  . Smokeless tobacco: Not on file  . Alcohol Use: Yes     rarely    OB History    Grav Para Term Preterm Abortions TAB SAB Ect Mult Living                  Review of Systems  Constitutional: Positive for fever, chills and fatigue.  HENT: Positive for congestion, sore throat and rhinorrhea. Negative for  ear pain and sinus pressure.   Respiratory: Positive for cough. Negative for wheezing.   Gastrointestinal: Positive for nausea. Negative for vomiting and abdominal pain.  Musculoskeletal: Positive for myalgias.  Neurological: Positive for headaches.  All other systems reviewed and are negative.    Allergies  Review of patient's allergies indicates no known allergies.  Home Medications   Current Outpatient Rx  Name Route Sig Dispense Refill  . IBUPROFEN 100 MG PO TABS Oral Take 200 mg by mouth every 6 (six) hours as needed. PAIN       BP 152/81  Pulse 102  Temp(Src) 100.8 F (38.2 C) (Oral)  Resp 18  SpO2 97%  Physical Exam  Nursing note and vitals reviewed. Constitutional: She is oriented to person, place, and time. She appears well-developed and well-nourished. No distress.  HENT:  Head: Normocephalic.  Eyes: Pupils are equal, round, and reactive to light.  Neck: Normal range of motion.  Cardiovascular: Normal rate and normal heart sounds.   Pulmonary/Chest: Effort normal and breath sounds normal. No respiratory distress. She has no wheezes. She has no rales.  Abdominal: Soft. She exhibits no distension. There is no tenderness. There is no rebound.  Musculoskeletal: Normal range of motion. She exhibits no edema and no tenderness.  Neurological: She is alert and oriented to person, place, and time. No cranial nerve deficit.  Skin: Skin is warm and dry. No erythema.  Psychiatric: She  has a normal mood and affect.    ED Course  Procedures (including critical care time)   Labs Reviewed  I-STAT, CHEM 8  CBC   No results found.   No diagnosis found.    MDM  Upper respiratory symptoms with nausea x 4 days.  Dizziness resolved in ER after 1 liter NS.  WBC low and platelets low.  Weight loss over 1 month.  Will follow up with Dr. Renne Crigler this week. Zofran for nausea.  Chest x-ray shows COPD.   Labs Reviewed  CBC - Abnormal; Notable for the following:    WBC 2.3 (*)     Platelets 90 (*)    All other components within normal limits  POCT I-STAT, CHEM 8 - Abnormal; Notable for the following:    BUN 4 (*)    Calcium, Ion 1.00 (*)    All other components within normal limits  I-STAT, CHEM 8          Jethro Bastos, NP 06/24/11 2207

## 2011-06-24 NOTE — ED Provider Notes (Signed)
Medical screening examination/treatment/procedure(s) were performed by non-physician practitioner and as supervising physician I was immediately available for consultation/collaboration.   Gerhard Munch, MD 06/24/11 2312

## 2011-06-24 NOTE — ED Notes (Signed)
Pt reports getting dizzy and hitting floor today, denies loc. Reports hitting right hip when falling.

## 2012-02-05 ENCOUNTER — Other Ambulatory Visit (HOSPITAL_COMMUNITY): Payer: Self-pay | Admitting: Internal Medicine

## 2012-02-05 DIAGNOSIS — Z1231 Encounter for screening mammogram for malignant neoplasm of breast: Secondary | ICD-10-CM

## 2012-02-26 ENCOUNTER — Ambulatory Visit (HOSPITAL_COMMUNITY)
Admission: RE | Admit: 2012-02-26 | Discharge: 2012-02-26 | Disposition: A | Discharge status: Home / Self Care | Payer: BC Managed Care – PPO | Source: Ambulatory Visit | Attending: Internal Medicine | Admitting: Internal Medicine

## 2012-02-26 DIAGNOSIS — Z1231 Encounter for screening mammogram for malignant neoplasm of breast: Secondary | ICD-10-CM | POA: Insufficient documentation

## 2012-03-03 ENCOUNTER — Other Ambulatory Visit: Payer: Self-pay | Admitting: Internal Medicine

## 2012-03-03 DIAGNOSIS — R928 Other abnormal and inconclusive findings on diagnostic imaging of breast: Secondary | ICD-10-CM

## 2012-03-05 ENCOUNTER — Ambulatory Visit
Admission: RE | Admit: 2012-03-05 | Discharge: 2012-03-05 | Disposition: A | Discharge status: Home / Self Care | Payer: BC Managed Care – PPO | Source: Ambulatory Visit | Attending: Internal Medicine | Admitting: Internal Medicine

## 2012-03-05 DIAGNOSIS — R928 Other abnormal and inconclusive findings on diagnostic imaging of breast: Secondary | ICD-10-CM

## 2012-06-23 ENCOUNTER — Other Ambulatory Visit: Payer: Self-pay | Admitting: Orthopedic Surgery

## 2012-06-23 DIAGNOSIS — M25512 Pain in left shoulder: Secondary | ICD-10-CM

## 2012-06-28 ENCOUNTER — Ambulatory Visit
Admission: RE | Admit: 2012-06-28 | Discharge: 2012-06-28 | Disposition: A | Discharge status: Home / Self Care | Payer: BC Managed Care – PPO | Source: Ambulatory Visit | Attending: Orthopedic Surgery | Admitting: Orthopedic Surgery

## 2012-06-28 DIAGNOSIS — M25512 Pain in left shoulder: Secondary | ICD-10-CM

## 2012-09-16 ENCOUNTER — Other Ambulatory Visit: Payer: Self-pay | Admitting: Registered Nurse

## 2012-09-16 ENCOUNTER — Other Ambulatory Visit (HOSPITAL_COMMUNITY)
Admission: RE | Admit: 2012-09-16 | Discharge: 2012-09-16 | Disposition: A | Discharge status: Home / Self Care | Payer: BC Managed Care – PPO | Source: Ambulatory Visit | Attending: Internal Medicine | Admitting: Internal Medicine

## 2012-09-16 DIAGNOSIS — Z01419 Encounter for gynecological examination (general) (routine) without abnormal findings: Secondary | ICD-10-CM | POA: Insufficient documentation

## 2013-07-26 HISTORY — PX: BREAST BIOPSY: SHX20

## 2013-09-08 HISTORY — PX: BREAST EXCISIONAL BIOPSY: SUR124

## 2014-11-24 ENCOUNTER — Encounter: Payer: Self-pay | Admitting: Internal Medicine

## 2015-12-27 HISTORY — PX: BREAST BIOPSY: SHX20

## 2017-10-30 ENCOUNTER — Encounter: Payer: Self-pay | Admitting: Gastroenterology

## 2017-10-30 DIAGNOSIS — Z8249 Family history of ischemic heart disease and other diseases of the circulatory system: Secondary | ICD-10-CM | POA: Diagnosis not present

## 2017-10-30 DIAGNOSIS — I10 Essential (primary) hypertension: Secondary | ICD-10-CM | POA: Diagnosis not present

## 2017-10-30 DIAGNOSIS — Z0000 Encounter for general adult medical examination without abnormal findings: Secondary | ICD-10-CM | POA: Diagnosis not present

## 2017-10-30 DIAGNOSIS — R69 Illness, unspecified: Secondary | ICD-10-CM | POA: Diagnosis not present

## 2017-10-30 DIAGNOSIS — M810 Age-related osteoporosis without current pathological fracture: Secondary | ICD-10-CM | POA: Diagnosis not present

## 2017-10-30 DIAGNOSIS — R636 Underweight: Secondary | ICD-10-CM | POA: Diagnosis not present

## 2017-10-30 DIAGNOSIS — E7800 Pure hypercholesterolemia, unspecified: Secondary | ICD-10-CM | POA: Diagnosis not present

## 2017-10-30 DIAGNOSIS — Z01419 Encounter for gynecological examination (general) (routine) without abnormal findings: Secondary | ICD-10-CM | POA: Diagnosis not present

## 2017-12-29 ENCOUNTER — Other Ambulatory Visit: Payer: Self-pay

## 2017-12-29 ENCOUNTER — Ambulatory Visit (AMBULATORY_SURGERY_CENTER): Payer: Self-pay

## 2017-12-29 VITALS — Ht 62.5 in | Wt 103.0 lb

## 2017-12-29 DIAGNOSIS — Z1211 Encounter for screening for malignant neoplasm of colon: Secondary | ICD-10-CM

## 2017-12-29 NOTE — Progress Notes (Signed)
Denies allergies to eggs or soy products. Denies complication of anesthesia or sedation. Denies use of weight loss medication. Denies use of O2.   Emmi instructions declined.  

## 2017-12-31 ENCOUNTER — Encounter: Payer: Self-pay | Admitting: Gastroenterology

## 2018-01-14 ENCOUNTER — Encounter: Payer: Self-pay | Admitting: Gastroenterology

## 2018-01-14 ENCOUNTER — Ambulatory Visit (AMBULATORY_SURGERY_CENTER): Payer: Medicare HMO | Admitting: Gastroenterology

## 2018-01-14 VITALS — BP 144/73 | HR 71 | Temp 99.3°F | Resp 18 | Ht 62.0 in | Wt 103.0 lb

## 2018-01-14 DIAGNOSIS — Z800 Family history of malignant neoplasm of digestive organs: Secondary | ICD-10-CM

## 2018-01-14 DIAGNOSIS — Z1211 Encounter for screening for malignant neoplasm of colon: Secondary | ICD-10-CM | POA: Diagnosis not present

## 2018-01-14 MED ORDER — SODIUM CHLORIDE 0.9 % IV SOLN: 500.0000 mL | Freq: Once | INTRAVENOUS | Status: DC

## 2018-01-14 NOTE — Progress Notes (Signed)
Report to PACU, RN, vss, BBS= Clear.  

## 2018-01-14 NOTE — Progress Notes (Signed)
I have reviewed the patient's medical history in detail and updated the computerized patient record.

## 2018-01-14 NOTE — Patient Instructions (Signed)
YOU HAD AN ENDOSCOPIC PROCEDURE TODAY AT [facility]:   Refer to the procedure report that was given to you for any specific questions about what was found during the examination.  If the procedure report does not answer your questions, please call your gastroenterologist to clarify.  If you requested that your care partner not be given the details of your procedure findings, then the procedure report has been included in a sealed envelope for you to review at your convenience later.  YOU SHOULD EXPECT: Some feelings of bloating in the abdomen. Passage of more gas than usual.  Walking can help get rid of the air that was put into your GI tract during the procedure and reduce the bloating. If you had a lower endoscopy (such as a colonoscopy or flexible sigmoidoscopy) you may notice spotting of blood in your stool or on the toilet paper. If you underwent a bowel prep for your procedure, you may not have a normal bowel movement for a few days.  Please Note:  You might notice some irritation and congestion in your nose or some drainage.  This is from the oxygen used during your procedure.  There is no need for concern and it should clear up in a day or so.  SYMPTOMS TO REPORT IMMEDIATELY:   Following lower endoscopy (colonoscopy or flexible sigmoidoscopy):  Excessive amounts of blood in the stool  Significant tenderness or worsening of abdominal pains  Swelling of the abdomen that is new, acute  Fever of 100F or higher   For urgent or emergent issues, a gastroenterologist can be reached at any hour by calling [phone].   DIET:  We do recommend a small meal at first, but then you may proceed to your regular diet.  Drink plenty of fluids but you should avoid alcoholic beverages for 24 hours.  ACTIVITY:  You should plan to take it easy for the rest of today and you should NOT DRIVE or use heavy machinery until tomorrow (because of the sedation medicines used during the test).     FOLLOW UP: Our staff will call the number listed on your records the next business day following your procedure to check on you and address any questions or concerns that you may have regarding the information given to you following your procedure. If we do not reach you, we will leave a message.  However, if you are feeling well and you are not experiencing any problems, there is no need to return our call.  We will assume that you have returned to your regular daily activities without incident.  If any biopsies were taken you will be contacted by phone or by letter within the next 1-3 weeks.  Please call us at [phone] if you have not heard about the biopsies in 3 weeks.    SIGNATURES/CONFIDENTIALITY: You and/or your care partner have signed paperwork which will be entered into your electronic medical record.  These signatures attest to the fact that that the information above on your After Visit Summary has been reviewed and is understood.  Full responsibility of the confidentiality of this discharge information lies with you and/or your care-partner.   Hemorrhoid  Information given.

## 2018-01-14 NOTE — Op Note (Signed)
[facility] Patient Name: Christina Sexton Procedure Date: 01/14/2018 10:57 AM MRN: [id number] Endoscopist: Mauri Pole , MD Age: 67 Referring MD:  Date of Birth: 03-Jul-1950 Gender: Female Account #: [id number] Procedure:                Colonoscopy Indications:              Screening patient at increased risk: Family history                            of 1st-degree relative with colorectal cancer at                            age 42 years (or older) Medicines:                Monitored Anesthesia Care Procedure:                Pre-Anesthesia Assessment:                           - Prior to the procedure, a History and Physical                            was performed, and patient medications and                            allergies were reviewed. The patient's tolerance of                            previous anesthesia was also reviewed. The risks                            and benefits of the procedure and the sedation                            options and risks were discussed with the patient.                            All questions were answered, and informed consent                            was obtained. Prior Anticoagulants: The patient has                            taken no previous anticoagulant or antiplatelet                            agents. ASA Grade Assessment: II - A patient with                            mild systemic disease. After reviewing the risks                            and benefits, the patient was deemed in  satisfactory condition to undergo the procedure.                           After obtaining informed consent, the colonoscope                            was passed under direct vision. Throughout the                            procedure, the patient's blood pressure, pulse, and                            oxygen saturations were monitored continuously. The                            Colonoscope was introduced  through the anus and                            advanced to the the cecum, identified by                            appendiceal orifice and ileocecal valve. The                            colonoscopy was performed without difficulty. The                            patient tolerated the procedure well. The quality                            of the bowel preparation was excellent. The                            ileocecal valve, appendiceal orifice, and rectum                            were photographed. Scope In: 11:05:27 AM Scope Out: 11:29:35 AM Scope Withdrawal Time: 0 hours 9 minutes 48 seconds  Total Procedure Duration: 0 hours 24 minutes 8 seconds  Findings:                 The perianal and digital rectal examinations were                            normal.                           Non-bleeding internal hemorrhoids were found during                            retroflexion. The hemorrhoids were medium-sized.                           The exam was otherwise without abnormality. Complications:            No immediate complications. Estimated Blood Loss:  Estimated blood loss: none. Impression:               - Non-bleeding internal hemorrhoids.                           - The examination was otherwise normal.                           - No specimens collected. Recommendation:           - Patient has a contact number available for                            emergencies. The signs and symptoms of potential                            delayed complications were discussed with the                            patient. Return to normal activities tomorrow.                            Written discharge instructions were provided to the                            patient.                           - Resume previous diet.                           - Continue present medications.                           - Repeat colonoscopy in 5 years for screening                             purposes. Mauri Pole, MD 01/14/2018 11:33:24 AM This report has been signed electronically.

## 2018-01-15 ENCOUNTER — Telehealth: Payer: Self-pay

## 2018-01-15 NOTE — Telephone Encounter (Signed)
Left message

## 2018-01-15 NOTE — Telephone Encounter (Signed)
  Follow up Call-  Call Christina Sexton number 01/14/2018  Post procedure Call Christina Sexton phone  # [phone]  Permission to leave phone message Yes  Some recent data might be hidden     Patient questions:  Do you have a fever, pain , or abdominal swelling? No. Pain Score  0 *  Have you tolerated food without any problems? Yes.    Have you been able to return to your normal activities? Yes.    Do you have any questions about your discharge instructions: Diet   No. Medications  No. Follow up visit  No.  Do you have questions or concerns about your Care? No.  Actions: * If pain score is 4 or above: No action needed, pain <4.

## 2018-01-28 DIAGNOSIS — E7800 Pure hypercholesterolemia, unspecified: Secondary | ICD-10-CM | POA: Diagnosis not present

## 2018-03-31 DIAGNOSIS — E7800 Pure hypercholesterolemia, unspecified: Secondary | ICD-10-CM | POA: Diagnosis not present

## 2018-04-21 DIAGNOSIS — H2513 Age-related nuclear cataract, bilateral: Secondary | ICD-10-CM | POA: Diagnosis not present

## 2018-04-21 DIAGNOSIS — H25013 Cortical age-related cataract, bilateral: Secondary | ICD-10-CM | POA: Diagnosis not present

## 2018-04-21 DIAGNOSIS — H1013 Acute atopic conjunctivitis, bilateral: Secondary | ICD-10-CM | POA: Diagnosis not present

## 2018-04-21 DIAGNOSIS — H47399 Other disorders of optic disc, unspecified eye: Secondary | ICD-10-CM | POA: Diagnosis not present

## 2018-11-10 DIAGNOSIS — M810 Age-related osteoporosis without current pathological fracture: Secondary | ICD-10-CM | POA: Diagnosis not present

## 2018-11-10 DIAGNOSIS — Z0000 Encounter for general adult medical examination without abnormal findings: Secondary | ICD-10-CM | POA: Diagnosis not present

## 2018-11-10 DIAGNOSIS — E7800 Pure hypercholesterolemia, unspecified: Secondary | ICD-10-CM | POA: Diagnosis not present

## 2018-11-17 ENCOUNTER — Other Ambulatory Visit: Payer: Self-pay | Admitting: Internal Medicine

## 2018-11-17 DIAGNOSIS — N6310 Unspecified lump in the right breast, unspecified quadrant: Secondary | ICD-10-CM

## 2018-11-17 DIAGNOSIS — E7800 Pure hypercholesterolemia, unspecified: Secondary | ICD-10-CM | POA: Diagnosis not present

## 2018-11-17 DIAGNOSIS — Z01419 Encounter for gynecological examination (general) (routine) without abnormal findings: Secondary | ICD-10-CM | POA: Diagnosis not present

## 2018-11-17 DIAGNOSIS — Z0000 Encounter for general adult medical examination without abnormal findings: Secondary | ICD-10-CM | POA: Diagnosis not present

## 2018-11-17 DIAGNOSIS — R0989 Other specified symptoms and signs involving the circulatory and respiratory systems: Secondary | ICD-10-CM | POA: Diagnosis not present

## 2018-12-07 ENCOUNTER — Ambulatory Visit
Admission: RE | Admit: 2018-12-07 | Discharge: 2018-12-07 | Disposition: A | Discharge status: Home / Self Care | Payer: Medicare HMO | Source: Ambulatory Visit | Attending: Internal Medicine | Admitting: Internal Medicine

## 2018-12-07 ENCOUNTER — Other Ambulatory Visit: Payer: Self-pay

## 2018-12-07 ENCOUNTER — Other Ambulatory Visit: Payer: Self-pay | Admitting: Internal Medicine

## 2018-12-07 DIAGNOSIS — R922 Inconclusive mammogram: Secondary | ICD-10-CM | POA: Diagnosis not present

## 2018-12-07 DIAGNOSIS — N6311 Unspecified lump in the right breast, upper outer quadrant: Secondary | ICD-10-CM | POA: Diagnosis not present

## 2018-12-07 DIAGNOSIS — N6310 Unspecified lump in the right breast, unspecified quadrant: Secondary | ICD-10-CM

## 2018-12-09 ENCOUNTER — Ambulatory Visit
Admission: RE | Admit: 2018-12-09 | Discharge: 2018-12-09 | Disposition: A | Discharge status: Home / Self Care | Payer: Medicare HMO | Source: Ambulatory Visit | Attending: Internal Medicine | Admitting: Internal Medicine

## 2018-12-09 ENCOUNTER — Other Ambulatory Visit: Payer: Self-pay

## 2018-12-09 DIAGNOSIS — N6311 Unspecified lump in the right breast, upper outer quadrant: Secondary | ICD-10-CM | POA: Diagnosis not present

## 2018-12-09 DIAGNOSIS — C50411 Malignant neoplasm of upper-outer quadrant of right female breast: Secondary | ICD-10-CM | POA: Diagnosis not present

## 2018-12-09 DIAGNOSIS — N6310 Unspecified lump in the right breast, unspecified quadrant: Secondary | ICD-10-CM

## 2018-12-10 ENCOUNTER — Encounter: Payer: Self-pay | Admitting: *Deleted

## 2018-12-10 ENCOUNTER — Other Ambulatory Visit: Payer: Medicare HMO

## 2018-12-11 ENCOUNTER — Telehealth: Payer: Self-pay | Admitting: Hematology

## 2018-12-11 NOTE — Telephone Encounter (Signed)
Spoke to patient to confirm afternoon [facility] appointment for 6/17, packet mailed and emailed to patient

## 2018-12-14 ENCOUNTER — Other Ambulatory Visit: Payer: Self-pay | Admitting: *Deleted

## 2018-12-14 DIAGNOSIS — D819 Combined immunodeficiency, unspecified: Secondary | ICD-10-CM | POA: Insufficient documentation

## 2018-12-14 DIAGNOSIS — Z171 Estrogen receptor negative status [ER-]: Secondary | ICD-10-CM

## 2018-12-14 DIAGNOSIS — C50411 Malignant neoplasm of upper-outer quadrant of right female breast: Secondary | ICD-10-CM

## 2018-12-14 NOTE — Progress Notes (Signed)
[place]   [phone] Fax:([phone]) [place] Note   Patient Care Team: Deland Pretty, MD as PCP - General (Internal Medicine) [name], RN as Oncology Nurse Navigator [name], RN as Oncology Nurse Navigator [name], MD as Consulting Physician (General Surgery) [name], MD as Consulting Physician (Hematology) [name], MD as Attending Physician (Radiation Oncology)  Date of Service:  12/16/2018   CHIEF COMPLAINTS/PURPOSE OF CONSULTATION:  Newly diagnosed Malignant neoplasm of upper-outer quadrant of right breast    Oncology History  Malignant neoplasm of upper-outer quadrant of right female breast ([place])  12/07/2018 Mammogram   Mammogram 12/07/18  IMPRESSION: 1. 3 cm mass in the 11 o'clock position of the right breast, 4 cm the nipple, measuring 3 x 2 x 2 cm, highly suspicious for breast carcinoma. No enlarged or abnormal right axillary lymph nodes.   12/09/2018 Cancer Staging   Staging form: Breast, AJCC 8th Edition - Clinical stage from 12/09/2018: Stage IIA (cT2, cN0, cM0, G3, ER-, PR-, HER2+) - Signed by [name], MD on 12/15/2018   12/09/2018 Initial Biopsy   Diagnosis 12/09/18 Breast, right, needle core biopsy, 11 o'clock - INVASIVE DUCTAL CARCINOMA. - DUCTAL CARCINOMA IN SITU. - SEE COMMENT.   12/09/2018 Receptors her2   The tumor cells are POSITIVE for Her2 (3+). Estrogen Receptor: 0%, NEGATIVE Progesterone Receptor: 0%, NEGATIVE Proliferation Marker Ki67: 30%   12/15/2018 Initial Diagnosis   Malignant neoplasm of upper-outer quadrant of right female breast ([place])      HISTORY OF PRESENTING ILLNESS:  Christina Sexton 68 y.o. female is a here because of newly diagnosed right breast cancer. The patient presents to the breast clinic today alone.  She found the lump herself when she felt it end of January. Her last mammogram was 1.5 years ago. She denies any pain with lump, or skin, breast,  weight or appetite change. She overall feels well and at baseline. When she chose to be seen for this facilities were closed down for COVID-19. With recent reopening she had mammogram, biopsy and was found to have cancer.  She had a benign mass in 2015 and had lumpectomy. She notes having breast biopsies in the past.   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 She is single with no children. She note she smoked socially when she was younger for 2-3 years. She drinks socially. She notes she has [employer] for insurance. She notes she has friends but not family support. She notes she is active and before the gym closed she was there 4-5 times a week.  They have a PMHx of allergy and sinus issues. She grew out of her asthma. She has kidney stones and osteoporosis. I reviewed her medication list. She is on Lipitor, no HTN meds. She notes her BP fluctuates. She notes her brother had colon cancer last year, s/p surgery.     GYN HISTORY  Menarchal: 12-13 LMP: In 2010 about 68 yo Contraceptive: NA HRT: No G0    REVIEW OF SYSTEMS:    Constitutional: Denies fevers, chills or abnormal night sweats Eyes: Denies blurriness of vision, double vision or watery eyes Ears, nose, mouth, throat, and face: Denies mucositis or sore throat Respiratory: Denies cough, dyspnea or wheezes Cardiovascular: Denies palpitation, chest discomfort or lower extremity swelling Gastrointestinal:  Denies nausea, heartburn or change in bowel habits Skin: Denies abnormal skin rashes Lymphatics: Denies new lymphadenopathy or easy bruising Neurological:Denies numbness, tingling or new weaknesses Behavioral/Psych: Mood is  stable, no new changes  Breast: (+) Palpable right breast mass  All other systems were reviewed with the patient and are negative.  MEDICAL HISTORY:  Past Medical History:  Diagnosis Date   Allergy    Anemia    Asthma    Chronic kidney disease    Hyperlipidemia    Hypertension    Kidney stone     Mitral valve prolapse    Osteoporosis     SURGICAL HISTORY: Past Surgical History:  Procedure Laterality Date   BREAST BIOPSY Left 12/27/2015   benign   BREAST BIOPSY Right 07/26/2013   benign   BREAST EXCISIONAL BIOPSY Right 09/08/2013   high risk    SHOULDER SURGERY      SOCIAL HISTORY: Social History   Socioeconomic History   Marital status: Single    Spouse name: Not on file   Number of children: Not on file   Years of education: Not on file   Highest education level: Not on file  Occupational History   Not on file  Social Needs   Financial resource strain: Not on file   Food insecurity    Worry: Not on file    Inability: Not on file   Transportation needs    Medical: Not on file    Non-medical: Not on file  Tobacco Use   Smoking status: Never Smoker   Smokeless tobacco: Never Used  Substance and Sexual Activity   Alcohol use: Yes    Comment: rarely   Drug use: No   Sexual activity: Not on file  Lifestyle   Physical activity    Days per week: Not on file    Minutes per session: Not on file   Stress: Not on file  Relationships   Social connections    Talks on phone: Not on file    Gets together: Not on file    Attends religious service: Not on file    Active member of club or organization: Not on file    Attends meetings of clubs or organizations: Not on file    Relationship status: Not on file   Intimate partner violence    Fear of current or ex partner: Not on file    Emotionally abused: Not on file    Physically abused: Not on file    Forced sexual activity: Not on file  Other Topics Concern   Not on file  Social History Narrative   Not on file    FAMILY HISTORY: Family History  Problem Relation Age of Onset   Colon cancer Brother    Esophageal cancer Neg Hx    Liver cancer Neg Hx    Pancreatic cancer Neg Hx    Rectal cancer Neg Hx    Stomach cancer Neg Hx     ALLERGIES:  has No Known  Allergies.  MEDICATIONS:  Current Outpatient Medications  Medication Sig Dispense Refill   atorvastatin (LIPITOR) 40 MG tablet Take 40 mg by mouth daily.     Current Facility-Administered Medications  Medication Dose Route Frequency Provider Last Rate Last Dose   0.9 %  sodium chloride infusion  500 mL Intravenous Once Nandigam, Kavitha V, MD        PHYSICAL EXAMINATION: ECOG PERFORMANCE STATUS: 0 - Asymptomatic  Vitals:   12/16/18 1245  BP: 138/73  Pulse: 98  Resp: 18  Temp: 98.3 F (36.8 C)  SpO2: 98%   Filed Weights   12/16/18 1245  Weight: 101 lb (45.8 kg)  GENERAL:alert, no distress and comfortable SKIN: skin color, texture, turgor are normal, no rashes or significant lesions EYES: normal, Conjunctiva are pink and non-injected, sclera clear  NECK: supple, thyroid normal size, non-tender, without nodularity LYMPH:  no palpable lymphadenopathy in the cervical, axillary  LUNGS: clear to auscultation and percussion with normal breathing effort HEART: regular rate & rhythm and no murmurs and no lower extremity edema ABDOMEN:abdomen soft, non-tender and normal bowel sounds Musculoskeletal:no cyanosis of digits and no clubbing  NEURO: alert & oriented x 3 with fluent speech, no focal motor/sensory deficits BREAST: (+) 3x4cm mass at 11:00 position of right breast (+) No palpable mass of left breast or b/l adenopathy (+) she has skin lesion of left breast   LABORATORY DATA:  I have reviewed the data as listed CBC Latest Ref Rng & Units 12/16/2018 06/24/2011 06/24/2011  WBC 4.0 - 10.5 K/uL 5.8 - 2.3(L)  Hemoglobin 12.0 - 15.0 g/dL 14.2 12.6 12.7  Hematocrit 36.0 - 46.0 % 44.2 37.0 37.1  Platelets 150 - 400 K/uL 167 - 90(L)    CMP Latest Ref Rng & Units 12/16/2018 06/24/2011  Glucose 70 - 99 mg/dL 127(H) 98  BUN 8 - 23 mg/dL 17 4(L)  Creatinine 0.44 - 1.00 mg/dL 0.94 0.70  Sodium 135 - 145 mmol/L 140 141  Potassium 3.5 - 5.1 mmol/L 3.8 4.9  Chloride 98 - 111 mmol/L  103 107  CO2 22 - 32 mmol/L 28 -  Calcium 8.9 - 10.3 mg/dL 9.4 -  Total Protein 6.5 - 8.1 g/dL 7.4 -  Total Bilirubin 0.3 - 1.2 mg/dL 0.8 -  Alkaline Phos 38 - 126 U/L 57 -  AST 15 - 41 U/L 20 -  ALT 0 - 44 U/L 20 -    RADIOGRAPHIC STUDIES: I have personally reviewed the radiological images as listed and agreed with the findings in the report. US Breast Ltd Uni Right Inc Axilla  Result Date: 12/07/2018 CLINICAL DATA:  Patient presents with a palpable lump in the upper right breast. She has a history a surgical excision of the right breast which may have been for a high risk lesion. This was performed in 2015. She has a history of a biopsy of left breast in 2017, reportedly benign. EXAM: DIGITAL DIAGNOSTIC BILATERAL MAMMOGRAM WITH CAD AND TOMO ULTRASOUND RIGHT BREAST COMPARISON:  Previous exam(s). ACR Breast Density Category c: The breast tissue is heterogeneously dense, which may obscure small masses. FINDINGS: There is an irregular mass with associated pleomorphic calcifications and architectural distortion in the upper, slightly outer aspect right breast, with adjacent postsurgical scarring. In the left breast, there is a small stable mass in the lower inner aspect of the breast with an adjacent ribbon shaped biopsy clip. There are no other discrete masses and no other areas of architectural distortion. Mammographic images were processed with CAD. On physical exam, there is a firm mobile mass in the upper right breast. Targeted ultrasound is performed, showing a hypoechoic irregular mass with partly ill-defined margins in the right breast at 11 o'clock, 4 cm the nipple, measuring 3 x 2 x 2 cm, corresponding to the palpable abnormality. Mass shows internal blood flow on color Doppler analysis. Sonographic evaluation of the right axilla shows no enlarged or abnormal lymph nodes. IMPRESSION: 1. 3 cm mass in the 11 o'clock position of the right breast, highly suspicious for breast carcinoma. No enlarged  or abnormal right axillary lymph nodes. RECOMMENDATION: Ultrasound-guided core needle biopsy of the right breast mass. This procedure has  been scheduled for later this week. I have discussed the findings and recommendations with the patient. Results were also provided in writing at the conclusion of the visit. If applicable, a reminder letter will be sent to the patient regarding the next appointment. BI-RADS CATEGORY  5: Highly suggestive of malignancy. Electronically Signed   By: Lajean Manes M.D.   On: 12/07/2018 09:59   Mm Diag Breast Tomo Bilateral  Result Date: 12/07/2018 CLINICAL DATA:  Patient presents with a palpable lump in the upper right breast. She has a history a surgical excision of the right breast which may have been for a high risk lesion. This was performed in 2015. She has a history of a biopsy of left breast in 2017, reportedly benign. EXAM: DIGITAL DIAGNOSTIC BILATERAL MAMMOGRAM WITH CAD AND TOMO ULTRASOUND RIGHT BREAST COMPARISON:  Previous exam(s). ACR Breast Density Category c: The breast tissue is heterogeneously dense, which may obscure small masses. FINDINGS: There is an irregular mass with associated pleomorphic calcifications and architectural distortion in the upper, slightly outer aspect right breast, with adjacent postsurgical scarring. In the left breast, there is a small stable mass in the lower inner aspect of the breast with an adjacent ribbon shaped biopsy clip. There are no other discrete masses and no other areas of architectural distortion. Mammographic images were processed with CAD. On physical exam, there is a firm mobile mass in the upper right breast. Targeted ultrasound is performed, showing a hypoechoic irregular mass with partly ill-defined margins in the right breast at 11 o'clock, 4 cm the nipple, measuring 3 x 2 x 2 cm, corresponding to the palpable abnormality. Mass shows internal blood flow on color Doppler analysis. Sonographic evaluation of the right axilla  shows no enlarged or abnormal lymph nodes. IMPRESSION: 1. 3 cm mass in the 11 o'clock position of the right breast, highly suspicious for breast carcinoma. No enlarged or abnormal right axillary lymph nodes. RECOMMENDATION: Ultrasound-guided core needle biopsy of the right breast mass. This procedure has been scheduled for later this week. I have discussed the findings and recommendations with the patient. Results were also provided in writing at the conclusion of the visit. If applicable, a reminder letter will be sent to the patient regarding the next appointment. BI-RADS CATEGORY  5: Highly suggestive of malignancy. Electronically Signed   By: Lajean Manes M.D.   On: 12/07/2018 09:59   Mm Clip Placement Right  Result Date: 12/09/2018 CLINICAL DATA:  Status post ultrasound-guided core needle biopsy of a 3 cm mass in the 11 o'clock position of the right breast. EXAM: DIAGNOSTIC RIGHT MAMMOGRAM POST STEREOTACTIC BIOPSY COMPARISON:  Previous exam(s). FINDINGS: Mammographic images were obtained following ultrasound guided biopsy of the recently demonstrated 3 cm mass in the 11 o'clock position of the right breast. These demonstrate a ribbon shaped biopsy marker clip within the biopsied mass. IMPRESSION: Appropriate clip deployment following right breast ultrasound-guided core needle biopsy. Final Assessment: Post Procedure Mammograms for Marker Placement Electronically Signed   By: Claudie Revering M.D.   On: 12/09/2018 09:55   [place] Rt Breast Bx W Loc Dev 1st Lesion Img Bx Spec US Guide  Addendum Date: 12/10/2018   ADDENDUM REPORT: 12/10/2018 11:25 ADDENDUM: Pathology revealed GRADE III INVASIVE DUCTAL CARCINOMA, DUCTAL CARCINOMA IN SITU of the RIGHT breast, 11 o'clock. This was found to be concordant by Dr. Claudie Revering. Pathology results were discussed with the patient by telephone. The patient reported doing well after the biopsy with tenderness at the site. Post biopsy  instructions and care were reviewed and  questions were answered. The patient was encouraged to call [facility] for any additional concerns. The patient was referred to [facility] on December 16, 2018. Pathology results reported by Stacie Acres, RN on 12/10/2018. Electronically Signed   By: Claudie Revering M.D.   On: 12/10/2018 11:25   Result Date: 12/10/2018 CLINICAL DATA:  3 cm mass in the 11 o'clock position of the right breast at recent mammography and ultrasound. EXAM: ULTRASOUND GUIDED RIGHT BREAST CORE NEEDLE BIOPSY COMPARISON:  Previous exam(s). FINDINGS: I met with the patient and we discussed the procedure of ultrasound-guided biopsy, including benefits and alternatives. We discussed the high likelihood of a successful procedure. We discussed the risks of the procedure, including infection, bleeding, tissue injury, clip migration, and inadequate sampling. Informed written consent was given. The usual time-out protocol was performed immediately prior to the procedure. Lesion quadrant: Upper outer quadrant Using sterile technique and 1% Lidocaine as local anesthetic, under direct ultrasound visualization, a 12 gauge spring-loaded device was used to perform biopsy of the recently demonstrated 3 cm mass in the 11 o'clock position of the right breast using a caudal approach. At the conclusion of the procedure a ribbon shaped tissue marker clip was deployed into the biopsy cavity. Follow up 2 view mammogram was performed and dictated separately. IMPRESSION: Ultrasound guided biopsy of the recently demonstrated 3 cm mass in the 11 o'clock position of the right breast. No apparent complications. Electronically Signed: By: Claudie Revering M.D. On: 12/09/2018 09:23    ASSESSMENT & PLAN:  RIHANA KIDDY is a 68 y.o. caucasian female with a history of asthma as a child, fluctuating BP, osteoporosis   1. Malignant neoplasm of upper-outer quadrant of right,  Stage IIA, c(T2,N0,M0), ER/PR-, HER2+, Grade III -We discussed her image findings and the biopsy results in great details. She has invasive ductal carcinoma and DCIS.  -we discussed that surgery is the only curative option. She is agreeable with that. She was seen by Dr. Barry Dienes today to further discuss her options. Given the size of her tumor, she may need mastectomy if she choose to have surgery first.  -I recommend we obtain breast MRI, CT CAP and bone scan to complete staging. She is agreeable.  -We discussed her risk of cancer recurrence after surgery, which depends on the stage and biology of the tumor. She is stage II, with ER/PR negative and HER2 positive disease which is agreesive. I discussed that she has high risk for recurrence with surgeyr alone -Given this is high risk cancer, systemic chemotherapy is recommended to reduce her risk of distant cancer recurrence. I discussed combination of chemo and anti-her2 treatment with TCHP q3weeks for 6 cycles followed by maintenance H/P to complete 1 year of treatment. Although this is an aggressive regimen she is fit and overall healthy at her age, I believe she will be able to tolerate. If not tolerable we can reduce to Taxol and Herceptin or Kadcyla which is not as effective on her cancer. We discussed that chemo can be given before or after surgery. I discussed the benefits of neoadjuvant chemo, including downstaging her disease and making her a candidate for lumpectomy, being able to tell us response from chemo, and additional therapy if she does not achieve complete response.  -Chemotherapy consent: Side effects including but does not limited to, fatigue, nausea, vomiting, diarrhea, hair loss, neuropathy, fluid retention,  renal and kidney dysfunction, neutropenic fever, needed for blood transfusion, bleeding, congestive heart failure, were discussed with patient in great detail. She will think about it. I provided her with reading material on these  medications.   -I discussed the option of Dignicap to help reduce hair loss.  -she did not anticipate she will need chemo, and is very concerned about the potential side effect from chemo.  She lives at home.  She will think about let us know in the next about her chemo decision. -I recommend port placement by Dr Barry Dienes. She will have chemo education class before starting treatment.  -Will monitor her heart function on Herceptin. Will obtain baseline ECHO before start of treatment.  -She was also seen by radiation oncologist Dr. Isidore Moos today. If her surgical sentinel lymph nodes were negative, she would not need post mastectomy radiation. Otherwise radiation is recommended to reduce the risk for local recurrence.  -Given the negative ER and PR expression I do not recommend adjuvant endocrine therapy.  -We also discussed the breast cancer surveillance after her surgery. She will continue annual screening mammogram, self exam, and a routine office visit with lab and exam with [place]. -Labs reviewed today, CBC and CMP WNL except BG 127. Physical exam showed 3x4cm right breast mass of known cancer and left breast skin lesion.    2. Social support  -She is single, lives al one and has no children. She lives 1-2 miles from cancer center.  -She notes she has friends but overall not much family support.     PLAN:  -pt agrees to have Breast MRI, CT CAP and bone scan in 1-2 weeks  -Port placed by Dr Barry Dienes in 1-2 weeks  -she will call us back in a few days about her decision on chemo, if she agrees, will schedule baseline ECHO and Chemo education class in 1-2 weeks, and start her first cycle chemo in about 2 weeks. I will see her before chemo     Orders Placed This Encounter  Procedures   MR BREAST BILATERAL W [place] CAD    Epic order Ins-[employer]/mcr PF:12/07/18 bcg Menses:no Malignant neoplasm of upper-outer quadrant of right breast Wt 101/ ht 5'2/ yes claus/ no need/ no stents or implants/  no metal removed from eyes by physician and no metal in body/ no surgeries/ no hx of kid or liv dz/ no hx of lupus or ra/ no htn or diab/ nkda to iv dye/ fwp and pt    Standing Status:   Future    Standing Expiration Date:   02/15/2020    Order Specific Question:   ** REASON FOR EXAM (FREE TEXT)    Answer:   prior to neoadjuvant chemo    Order Specific Question:   If indicated for the ordered procedure, I authorize the administration of contrast media per Radiology protocol    Answer:   Yes    Order Specific Question:   What is the patient's sedation requirement?    Answer:   No Sedation    Order Specific Question:   Does the patient have a pacemaker or implanted devices?    Answer:   No    Order Specific Question:   Radiology Contrast Protocol - do NOT remove file path    Answer:   \[email or web address]    Order Specific Question:   Preferred imaging location?    Answer:   [facility] (table limit-550lbs)   CT Abdomen Pelvis W Contrast  Standing Status:   Future    Standing Expiration Date:   12/16/2019    Order Specific Question:   ** REASON FOR EXAM (FREE TEXT)    Answer:   new breast cancer staging prior to starting neoadjuvant chemo    Order Specific Question:   If indicated for the ordered procedure, I authorize the administration of contrast media per Radiology protocol    Answer:   Yes    Order Specific Question:   Preferred imaging location?    Answer:   [facility]    Order Specific Question:   Is Oral Contrast requested for this exam?    Answer:   Yes, Per Radiology protocol    Order Specific Question:   Radiology Contrast Protocol - do NOT remove file path    Answer:   \[email or web address]   CT Chest W Contrast    Standing Status:   Future    Standing Expiration Date:   12/16/2019    Order Specific Question:   ** REASON FOR EXAM (FREE TEXT)    Answer:   new breast cancer staging prior to i    Order Specific  Question:   If indicated for the ordered procedure, I authorize the administration of contrast media per Radiology protocol    Answer:   Yes    Order Specific Question:   Preferred imaging location?    Answer:   [facility]    Order Specific Question:   Radiology Contrast Protocol - do NOT remove file path    Answer:   \[email or web address]   NM Bone Scan Whole Body    Standing Status:   Future    Standing Expiration Date:   12/16/2019    Order Specific Question:   ** REASON FOR EXAM (FREE TEXT)    Answer:   new breast cancer staging/prior to initiation of neoadjuvant chemo    Order Specific Question:   If indicated for the ordered procedure, I authorize the administration of a radiopharmaceutical per Radiology protocol    Answer:   Yes    Order Specific Question:   Preferred imaging location?    Answer:   [facility]    Order Specific Question:   Radiology Contrast Protocol - do NOT remove file path    Answer:   [email or web address]   ECHOCARDIOGRAM COMPLETE    Standing Status:   Future    Standing Expiration Date:   03/17/2020    Order Specific Question:   Where should this test be performed    Answer:   [facility]    Order Specific Question:   Perflutren DEFINITY (image enhancing agent) should be administered unless hypersensitivity or allergy exist    Answer:   Administer Perflutren    Order Specific Question:   Reason for exam-Echo    Answer:   Chemo  V67.2 / Z09    All questions were answered. The patient knows to call the clinic with any problems, questions or concerns. I spent 55 minutes counseling the patient face to face. The total time spent in the appointment was 60 minutes and more than 50% was on counseling.     [name], MD 12/16/2018 5:18 PM  I, Joslyn Devon, am acting as scribe for [name], MD.   I have reviewed the above documentation for accuracy and completeness, and I agree with the above.

## 2018-12-15 DIAGNOSIS — C50411 Malignant neoplasm of upper-outer quadrant of right female breast: Secondary | ICD-10-CM | POA: Insufficient documentation

## 2018-12-16 ENCOUNTER — Ambulatory Visit
Admission: RE | Admit: 2018-12-16 | Discharge: 2018-12-16 | Disposition: A | Discharge status: Home / Self Care | Payer: Medicare HMO | Source: Ambulatory Visit | Attending: Radiation Oncology | Admitting: Radiation Oncology

## 2018-12-16 ENCOUNTER — Encounter: Payer: Self-pay | Admitting: Hematology

## 2018-12-16 ENCOUNTER — Inpatient Hospital Stay: Payer: Medicare HMO

## 2018-12-16 ENCOUNTER — Inpatient Hospital Stay: Payer: Medicare HMO | Attending: Hematology | Admitting: Hematology

## 2018-12-16 ENCOUNTER — Encounter: Payer: Self-pay | Admitting: Radiation Oncology

## 2018-12-16 ENCOUNTER — Other Ambulatory Visit: Payer: Self-pay

## 2018-12-16 ENCOUNTER — Other Ambulatory Visit: Payer: Self-pay | Admitting: General Surgery

## 2018-12-16 VITALS — BP 138/73 | HR 98 | Temp 98.3°F | Resp 18 | Ht 62.0 in | Wt 101.0 lb

## 2018-12-16 DIAGNOSIS — C50411 Malignant neoplasm of upper-outer quadrant of right female breast: Secondary | ICD-10-CM | POA: Diagnosis not present

## 2018-12-16 DIAGNOSIS — Z171 Estrogen receptor negative status [ER-]: Secondary | ICD-10-CM

## 2018-12-16 DIAGNOSIS — Z800 Family history of malignant neoplasm of digestive organs: Secondary | ICD-10-CM | POA: Insufficient documentation

## 2018-12-16 LAB — CBC WITH DIFFERENTIAL (CANCER CENTER ONLY)
Abs Immature Granulocytes: 0.02 10*3/uL (ref 0.00–0.07)
Basophils Absolute: 0 10*3/uL (ref 0.0–0.1)
Basophils Relative: 1 %
Eosinophils Absolute: 0 10*3/uL (ref 0.0–0.5)
Eosinophils Relative: 1 %
HCT: 44.2 % (ref 36.0–46.0)
Hemoglobin: 14.2 g/dL (ref 12.0–15.0)
Immature Granulocytes: 0 %
Lymphocytes Relative: 18 %
Lymphs Abs: 1 10*3/uL (ref 0.7–4.0)
MCH: 29.7 pg (ref 26.0–34.0)
MCHC: 32.1 g/dL (ref 30.0–36.0)
MCV: 92.5 fL (ref 80.0–100.0)
Monocytes Absolute: 0.4 10*3/uL (ref 0.1–1.0)
Monocytes Relative: 7 %
Neutro Abs: 4.3 10*3/uL (ref 1.7–7.7)
Neutrophils Relative %: 73 %
Platelet Count: 167 10*3/uL (ref 150–400)
RBC: 4.78 MIL/uL (ref 3.87–5.11)
RDW: 12.7 % (ref 11.5–15.5)
WBC Count: 5.8 10*3/uL (ref 4.0–10.5)
nRBC: 0 % (ref 0.0–0.2)

## 2018-12-16 LAB — CMP (CANCER CENTER ONLY)
ALT: 20 U/L (ref 0–44)
AST: 20 U/L (ref 15–41)
Albumin: 4.5 g/dL (ref 3.5–5.0)
Alkaline Phosphatase: 57 U/L (ref 38–126)
Anion gap: 9 (ref 5–15)
BUN: 17 mg/dL (ref 8–23)
CO2: 28 mmol/L (ref 22–32)
Calcium: 9.4 mg/dL (ref 8.9–10.3)
Chloride: 103 mmol/L (ref 98–111)
Creatinine: 0.94 mg/dL (ref 0.44–1.00)
GFR, Est AFR Am: >60 mL/min (ref >60)
GFR, Estimated: >60 mL/min (ref >60)
Glucose, Bld: 127 mg/dL — ABNORMAL HIGH (ref 70–99)
Potassium: 3.8 mmol/L (ref 3.5–5.1)
Sodium: 140 mmol/L (ref 135–145)
Total Bilirubin: 0.8 mg/dL (ref 0.3–1.2)
Total Protein: 7.4 g/dL (ref 6.5–8.1)

## 2018-12-16 NOTE — Progress Notes (Signed)
Radiation Oncology         [phone] ________________________________  Initial outpatient Consultation  Name: Christina Sexton MRN: [id number]  Date: 12/16/2018  DOB: 1950/11/25  [id number], Thayer Jew, MD  [name], MD   REFERRING PHYSICIAN: [name], MD  DIAGNOSIS:    ICD-10-CM   1. Malignant neoplasm of upper-outer quadrant of right breast in female, estrogen receptor negative ([place])  C50.411    Z17.1    Cancer Staging Malignant neoplasm of upper-outer quadrant of right female breast [facility]) Staging form: Breast, AJCC 8th Edition - Clinical stage from 12/09/2018: Stage IIA (cT2, cN0, cM0, G3, ER-, PR-, HER2+) - Signed by [name], MD on 12/15/2018   CHIEF COMPLAINT: Here to discuss management of right breast cancer  HISTORY OF PRESENT ILLNESS::Christina Sexton is a 68 y.o. female who presented with right breast mass for 56mo  breast abnormality on the following imaging: upper right breast mass.  Ultrasound of breast  revealed a 3.0cm mass at 11:00 right breast; axilla was neg.   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  ER status: neg; PR status neg, Her2 status positive.  Neoadjuvant chemotherapy recommended at tumor board this AM.  The patient expresses that she is in somewhat a state of shock about learning that she needs chemotherapy for a good prognosis.  PREVIOUS RADIATION THERAPY: No  PAST MEDICAL HISTORY:  has a past medical history of Allergy, Anemia, Asthma, Chronic kidney disease, Hyperlipidemia, Hypertension, Kidney stone, Mitral valve prolapse, and Osteoporosis.    PAST SURGICAL HISTORY: Past Surgical History:  Procedure Laterality Date   BREAST BIOPSY Left 12/27/2015   benign   BREAST BIOPSY Right 07/26/2013   benign   BREAST EXCISIONAL BIOPSY Right 09/08/2013   high risk    SHOULDER SURGERY      FAMILY HISTORY: family history includes Colon cancer in her brother.  SOCIAL HISTORY:  reports that she has never smoked. She has never used smokeless  tobacco. She reports current alcohol use. She reports that she does not use drugs.  ALLERGIES: Patient has no known allergies.  MEDICATIONS:  Current Outpatient Medications  Medication Sig Dispense Refill   atorvastatin (LIPITOR) 40 MG tablet Take 40 mg by mouth daily.     Current Facility-Administered Medications  Medication Dose Route Frequency Provider Last Rate Last Dose   0.9 %  sodium chloride infusion  500 mL Intravenous Once Nandigam, KVenia Minks MD        REVIEW OF SYSTEMS: As above   PHYSICAL EXAM:  General: Alert and oriented, in no acute distress Skin: No concerning lesions.  Postbiopsy bruising over right breast Musculoskeletal: Good range of motion in shoulders Psychiatric: Judgment and insight are intact. Affect is appropriate. Breasts: At the 11:00 region of the right breast there is a mass that to palpation measures approximately 4 to 5 cm, taking up a large portion of the upper outer quadrant. No other palpable masses appreciated in the breasts or axillae bilaterally.   ECOG = 0  0 - Asymptomatic (Fully active, able to carry on all predisease activities without restriction)  1 - Symptomatic but completely ambulatory (Restricted in physically strenuous activity but ambulatory and able to carry out work of a light or sedentary nature. For example, light housework, office work)  2 - Symptomatic, <50% in bed during the day (Ambulatory and capable of all self care but unable to carry out any work activities. Up and about more than 50% of waking hours)  3 - Symptomatic, >  50% in bed, but not bedbound (Capable of only limited self-care, confined to bed or chair 50% or more of waking hours)  4 - Bedbound (Completely disabled. Cannot carry on any self-care. Totally confined to bed or chair)  5 - Death   Eustace Pen MM, Creech RH, Tormey DC, et al. [phone]). "Toxicity and response criteria of the [facility]". [place] Oncol. 5 (6):  649-55   LABORATORY DATA:  Lab Results  Component Value Date   WBC 5.8 12/16/2018   HGB 14.2 12/16/2018   HCT 44.2 12/16/2018   MCV 92.5 12/16/2018   PLT 167 12/16/2018   CMP     Component Value Date/Time   NA 140 12/16/2018 1227   K 3.8 12/16/2018 1227   CL 103 12/16/2018 1227   CO2 28 12/16/2018 1227   GLUCOSE 127 (H) 12/16/2018 1227   BUN 17 12/16/2018 1227   CREATININE 0.94 12/16/2018 1227   CALCIUM 9.4 12/16/2018 1227   PROT 7.4 12/16/2018 1227   ALBUMIN 4.5 12/16/2018 1227   AST 20 12/16/2018 1227   ALT 20 12/16/2018 1227   ALKPHOS 57 12/16/2018 1227   BILITOT 0.8 12/16/2018 1227   GFRNONAA >60 12/16/2018 1227   GFRAA >60 12/16/2018 1227         RADIOGRAPHY: US Breast Ltd Uni Right Inc Axilla  Result Date: 12/07/2018 CLINICAL DATA:  Patient presents with a palpable lump in the upper right breast. She has a history a surgical excision of the right breast which may have been for a high risk lesion. This was performed in 2015. She has a history of a biopsy of left breast in 2017, reportedly benign. EXAM: DIGITAL DIAGNOSTIC BILATERAL MAMMOGRAM WITH CAD AND TOMO ULTRASOUND RIGHT BREAST COMPARISON:  Previous exam(s). ACR Breast Density Category c: The breast tissue is heterogeneously dense, which may obscure small masses. FINDINGS: There is an irregular mass with associated pleomorphic calcifications and architectural distortion in the upper, slightly outer aspect right breast, with adjacent postsurgical scarring. In the left breast, there is a small stable mass in the lower inner aspect of the breast with an adjacent ribbon shaped biopsy clip. There are no other discrete masses and no other areas of architectural distortion. Mammographic images were processed with CAD. On physical exam, there is a firm mobile mass in the upper right breast. Targeted ultrasound is performed, showing a hypoechoic irregular mass with partly ill-defined margins in the right breast at 11 o'clock, 4 cm  the nipple, measuring 3 x 2 x 2 cm, corresponding to the palpable abnormality. Mass shows internal blood flow on color Doppler analysis. Sonographic evaluation of the right axilla shows no enlarged or abnormal lymph nodes. IMPRESSION: 1. 3 cm mass in the 11 o'clock position of the right breast, highly suspicious for breast carcinoma. No enlarged or abnormal right axillary lymph nodes. RECOMMENDATION: Ultrasound-guided core needle biopsy of the right breast mass. This procedure has been scheduled for later this week. I have discussed the findings and recommendations with the patient. Results were also provided in writing at the conclusion of the visit. If applicable, a reminder letter will be sent to the patient regarding the next appointment. BI-RADS CATEGORY  5: Highly suggestive of malignancy. Electronically Signed   By: Lajean Manes M.D.   On: 12/07/2018 09:59   Mm Diag Breast Tomo Bilateral  Result Date: 12/07/2018 CLINICAL DATA:  Patient presents with a palpable lump in the upper right breast. She has a history a surgical excision of  the right breast which may have been for a high risk lesion. This was performed in 2015. She has a history of a biopsy of left breast in 2017, reportedly benign. EXAM: DIGITAL DIAGNOSTIC BILATERAL MAMMOGRAM WITH CAD AND TOMO ULTRASOUND RIGHT BREAST COMPARISON:  Previous exam(s). ACR Breast Density Category c: The breast tissue is heterogeneously dense, which may obscure small masses. FINDINGS: There is an irregular mass with associated pleomorphic calcifications and architectural distortion in the upper, slightly outer aspect right breast, with adjacent postsurgical scarring. In the left breast, there is a small stable mass in the lower inner aspect of the breast with an adjacent ribbon shaped biopsy clip. There are no other discrete masses and no other areas of architectural distortion. Mammographic images were processed with CAD. On physical exam, there is a firm mobile mass  in the upper right breast. Targeted ultrasound is performed, showing a hypoechoic irregular mass with partly ill-defined margins in the right breast at 11 o'clock, 4 cm the nipple, measuring 3 x 2 x 2 cm, corresponding to the palpable abnormality. Mass shows internal blood flow on color Doppler analysis. Sonographic evaluation of the right axilla shows no enlarged or abnormal lymph nodes. IMPRESSION: 1. 3 cm mass in the 11 o'clock position of the right breast, highly suspicious for breast carcinoma. No enlarged or abnormal right axillary lymph nodes. RECOMMENDATION: Ultrasound-guided core needle biopsy of the right breast mass. This procedure has been scheduled for later this week. I have discussed the findings and recommendations with the patient. Results were also provided in writing at the conclusion of the visit. If applicable, a reminder letter will be sent to the patient regarding the next appointment. BI-RADS CATEGORY  5: Highly suggestive of malignancy. Electronically Signed   By: Lajean Manes M.D.   On: 12/07/2018 09:59   Mm Clip Placement Right  Result Date: 12/09/2018 CLINICAL DATA:  Status post ultrasound-guided core needle biopsy of a 3 cm mass in the 11 o'clock position of the right breast. EXAM: DIAGNOSTIC RIGHT MAMMOGRAM POST STEREOTACTIC BIOPSY COMPARISON:  Previous exam(s). FINDINGS: Mammographic images were obtained following ultrasound guided biopsy of the recently demonstrated 3 cm mass in the 11 o'clock position of the right breast. These demonstrate a ribbon shaped biopsy marker clip within the biopsied mass. IMPRESSION: Appropriate clip deployment following right breast ultrasound-guided core needle biopsy. Final Assessment: Post Procedure Mammograms for Marker Placement Electronically Signed   By: Claudie Revering M.D.   On: 12/09/2018 09:55   [place] Rt Breast Bx W Loc Dev 1st Lesion Img Bx Spec US Guide  Addendum Date: 12/10/2018   ADDENDUM REPORT: 12/10/2018 11:25 ADDENDUM: Pathology  revealed GRADE III INVASIVE DUCTAL CARCINOMA, DUCTAL CARCINOMA IN SITU of the RIGHT breast, 11 o'clock. This was found to be concordant by Dr. Claudie Revering. Pathology results were discussed with the patient by telephone. The patient reported doing well after the biopsy with tenderness at the site. Post biopsy instructions and care were reviewed and questions were answered. The patient was encouraged to call [facility] for any additional concerns. The patient was referred to [facility] on December 16, 2018. Pathology results reported by Stacie Acres, RN on 12/10/2018. Electronically Signed   By: Claudie Revering M.D.   On: 12/10/2018 11:25   Result Date: 12/10/2018 CLINICAL DATA:  3 cm mass in the 11 o'clock position of the right breast at recent mammography and ultrasound. EXAM: ULTRASOUND GUIDED  RIGHT BREAST CORE NEEDLE BIOPSY COMPARISON:  Previous exam(s). FINDINGS: I met with the patient and we discussed the procedure of ultrasound-guided biopsy, including benefits and alternatives. We discussed the high likelihood of a successful procedure. We discussed the risks of the procedure, including infection, bleeding, tissue injury, clip migration, and inadequate sampling. Informed written consent was given. The usual time-out protocol was performed immediately prior to the procedure. Lesion quadrant: Upper outer quadrant Using sterile technique and 1% Lidocaine as local anesthetic, under direct ultrasound visualization, a 12 gauge spring-loaded device was used to perform biopsy of the recently demonstrated 3 cm mass in the 11 o'clock position of the right breast using a caudal approach. At the conclusion of the procedure a ribbon shaped tissue marker clip was deployed into the biopsy cavity. Follow up 2 view mammogram was performed and dictated separately. IMPRESSION: Ultrasound guided biopsy of the recently demonstrated 3  cm mass in the 11 o'clock position of the right breast. No apparent complications. Electronically Signed: By: Claudie Revering M.D. On: 12/09/2018 09:23      IMPRESSION/PLAN: Right breast cancer  Patient is deciding whether she will be willing to undergo neoadjuvant chemotherapy  She is interested in breast conservation  It was a pleasure meeting the patient today. We discussed the risks, benefits, and side effects of radiotherapy. I recommend radiotherapy to the right breast to reduce her risk of locoregional recurrence by 2/3.  We discussed that radiation would take approximately 4-6 weeks to complete and that I would give the patient a few weeks to heal following surgery before starting treatment planning. We spoke about acute effects including skin irritation and fatigue as well as much less common late effects including internal organ injury or irritation. We spoke about the latest technology that is used to minimize the risk of late effects for patients undergoing radiotherapy to the breast or chest wall. No guarantees of treatment were given. The patient is enthusiastic about proceeding with treatment. I look forward to participating in the patient's care.  I will await her referral back to me for postoperative follow-up and eventual CT simulation/treatment planning.    __________________________________________   [name], MD

## 2018-12-17 ENCOUNTER — Telehealth: Payer: Self-pay | Admitting: Hematology

## 2018-12-17 NOTE — Telephone Encounter (Signed)
No los per 6/17. °

## 2018-12-21 ENCOUNTER — Other Ambulatory Visit: Payer: Self-pay | Admitting: Hematology

## 2018-12-21 ENCOUNTER — Ambulatory Visit
Admission: RE | Admit: 2018-12-21 | Discharge: 2018-12-21 | Disposition: A | Discharge status: Home / Self Care | Payer: Medicare HMO | Source: Ambulatory Visit | Attending: Hematology | Admitting: Hematology

## 2018-12-21 DIAGNOSIS — C50411 Malignant neoplasm of upper-outer quadrant of right female breast: Secondary | ICD-10-CM

## 2018-12-21 DIAGNOSIS — Z171 Estrogen receptor negative status [ER-]: Secondary | ICD-10-CM

## 2018-12-21 DIAGNOSIS — N6311 Unspecified lump in the right breast, upper outer quadrant: Secondary | ICD-10-CM | POA: Diagnosis not present

## 2018-12-21 MED ORDER — GADOBUTROL 1 MMOL/ML IV SOLN
5.0000 mL | Freq: Once | INTRAVENOUS | Status: AC | PRN
  Administered 2018-12-21: 5 mL via INTRAVENOUS

## 2018-12-21 MED ORDER — ONDANSETRON HCL 8 MG PO TABS: 8.0000 mg | ORAL_TABLET | Freq: Two times a day (BID) | ORAL | 1 refills | Status: DC | PRN

## 2018-12-21 MED ORDER — LIDOCAINE-PRILOCAINE 2.5-2.5 % EX CREA: TOPICAL_CREAM | CUTANEOUS | 3 refills | Status: DC

## 2018-12-21 MED ORDER — PROCHLORPERAZINE MALEATE 10 MG PO TABS: 10.0000 mg | ORAL_TABLET | Freq: Four times a day (QID) | ORAL | 1 refills | Status: DC | PRN

## 2018-12-21 MED ORDER — DEXAMETHASONE 4 MG PO TABS: 8.0000 mg | ORAL_TABLET | Freq: Two times a day (BID) | ORAL | 1 refills | Status: DC

## 2018-12-21 NOTE — Addendum Note (Signed)
Addended by: [name] on: 12/21/2018 03:50 PM   Modules accepted: Orders

## 2018-12-21 NOTE — Progress Notes (Signed)
START ON PATHWAY REGIMEN - Breast     A cycle is every 21 days:     Pertuzumab      Pertuzumab      Trastuzumab-xxxx      Trastuzumab-xxxx      Carboplatin      Docetaxel   **Always confirm dose/schedule in your pharmacy ordering system**  Patient Characteristics: Preoperative or Nonsurgical Candidate (Clinical Staging), Neoadjuvant Therapy followed by Surgery, Invasive Disease, Chemotherapy, HER2 Positive, ER Negative/Unknown Therapeutic Status: Preoperative or Nonsurgical Candidate (Clinical Staging) AJCC M Category: cM0 AJCC Grade: G3 Breast Surgical Plan: Neoadjuvant Therapy followed by Surgery ER Status: Negative (-) AJCC 8 Stage Grouping: IIA HER2 Status: Positive (+) AJCC T Category: cT2 AJCC N Category: cN0 PR Status: Negative (-) Intent of Therapy: Curative Intent, Discussed with Patient 

## 2018-12-22 ENCOUNTER — Inpatient Hospital Stay: Payer: Medicare HMO

## 2018-12-22 ENCOUNTER — Other Ambulatory Visit: Payer: Self-pay | Admitting: Hematology

## 2018-12-22 ENCOUNTER — Telehealth: Payer: Self-pay | Admitting: *Deleted

## 2018-12-22 ENCOUNTER — Other Ambulatory Visit: Payer: Self-pay

## 2018-12-22 ENCOUNTER — Ambulatory Visit (HOSPITAL_COMMUNITY)
Admission: RE | Admit: 2018-12-22 | Discharge: 2018-12-22 | Disposition: A | Discharge status: Home / Self Care | Payer: Medicare HMO | Source: Ambulatory Visit | Attending: Hematology | Admitting: Hematology

## 2018-12-22 DIAGNOSIS — I348 Other nonrheumatic mitral valve disorders: Secondary | ICD-10-CM | POA: Diagnosis not present

## 2018-12-22 DIAGNOSIS — E785 Hyperlipidemia, unspecified: Secondary | ICD-10-CM | POA: Insufficient documentation

## 2018-12-22 DIAGNOSIS — Z171 Estrogen receptor negative status [ER-]: Secondary | ICD-10-CM

## 2018-12-22 DIAGNOSIS — I341 Nonrheumatic mitral (valve) prolapse: Secondary | ICD-10-CM | POA: Insufficient documentation

## 2018-12-22 DIAGNOSIS — C50411 Malignant neoplasm of upper-outer quadrant of right female breast: Secondary | ICD-10-CM | POA: Diagnosis not present

## 2018-12-22 DIAGNOSIS — I129 Hypertensive chronic kidney disease with stage 1 through stage 4 chronic kidney disease, or unspecified chronic kidney disease: Secondary | ICD-10-CM | POA: Diagnosis not present

## 2018-12-22 DIAGNOSIS — N189 Chronic kidney disease, unspecified: Secondary | ICD-10-CM | POA: Insufficient documentation

## 2018-12-22 NOTE — Progress Notes (Signed)
  Echocardiogram 2D Echocardiogram has been performed.  Randa Lynn Clydie Dillen 12/22/2018, 11:24 AM

## 2018-12-23 ENCOUNTER — Telehealth: Payer: Self-pay

## 2018-12-23 NOTE — Telephone Encounter (Signed)
Nutrition Assessment  Reason for Assessment:  Pt attended Breast Clinic on 6/17 and received nutrition packet.    ASSESSMENT:  68 year old female with new diagnosis of breast cancer.  Planning chemotherapy.    Spoke with patient via phone this pm to introduce self and service at the cancer center.  Patient with questions and concerns regarding chemotherapy.    Medications:  reviewed  Labs: reviewed  Anthropometrics:   Height: 62 inches Weight: 101 lb BMI: 18   NUTRITION DIAGNOSIS: Food and nutrition related knowledge deficit related to new diagnosis of breast cancer as evidenced by no prior need for nutrition related information.  INTERVENTION:  Reviewed importance of healthy plant based diet.   Answered questions regarding oral nutrition supplements.   Reviewed importance of weight maintenance during treatment.   Patient asking for RD follow-up once starts chemotherapy treatment (either come by during treatment or phone contact)    MONITORING, EVALUATION, and GOAL: Pt will consume a healthy plant based diet to maintain lean body mass throughout treatment.   Next visit:  Phone f/u Wednesday, July 15th  Merdith Boyd B. Zenia Resides, [place], [place] Registered Dietitian [phone] (pager)

## 2018-12-23 NOTE — Progress Notes (Signed)
12/22/2018- noted in Epic-ECHO  12/16/2018- noted in Epic-labs-CBC, CMP

## 2018-12-23 NOTE — Patient Instructions (Addendum)
Christina Sexton  12/23/2018   Your procedure is scheduled on: Monday 12/28/2018    Report to [facility] Main  Entrance             Report to  Short Stay at  [place] 19 TEST ON 12-24-18 @ 1:00 PM, THIS TEST MUST BE DONE BEFORE SURGERY, COME TO [place].            ONCE YOUR COVID TEST IS COMPLETED, PLEASE BEGIN THE QUARANTINE INSTRUCTIONS AS OUTLINED IN YOUR HANDOUT.   Call this number if you have problems the morning of surgery [phone]    Remember: Do not eat food or drink liquids :After Midnight.               BRUSH YOUR TEETH MORNING OF SURGERY AND RINSE YOUR MOUTH OUT, NO CHEWING GUM CANDY OR MINTS.     Take these medicines the morning of surgery with A SIP OF WATER: Atorvastatin (Lipitor)                                You may not have any metal on your body including hair pins and              piercings    Do not wear jewelry, make-up, lotions, powders or perfumes, deodorant             Do not wear nail polish.  Do not shave  48 hours prior to surgery.             Do not bring valuables to the hospital. [place].  Contacts, dentures or bridgework may not be worn into surgery.       Patients discharged the day of surgery will not be allowed to drive home. IF YOU ARE HAVING SURGERY AND GOING HOME THE SAME DAY, YOU MUST HAVE AN ADULT TO DRIVE YOU HOME AND BE WITH YOU FOR 24 HOURS. YOU MAY GO HOME BY TAXI OR UBER OR ORTHERWISE, BUT AN ADULT MUST ACCOMPANY YOU HOME AND STAY WITH YOU FOR 24 HOURS.  Name and phone number of your driver: Tobie Poet [phone]               Please read over the following fact sheets you were given: _____________________________________________________________________             [facility] - Preparing for Surgery Before surgery, you can play an important role.  Because skin is not  sterile, your skin needs to be as free of germs as possible.  You can reduce the number of germs on your skin by washing with CHG (chlorahexidine gluconate) soap before surgery.  CHG is an antiseptic cleaner which kills germs and bonds with the skin to continue killing germs even after washing. Please DO NOT use if you have an allergy to CHG or antibacterial soaps.  If your skin becomes reddened/irritated stop using the CHG and inform your nurse when you arrive at Short Stay. Do not shave (including legs and underarms) for at least 48 hours prior to the first CHG shower.  You may  shave your face/neck. Please follow these instructions carefully:  1.  Shower with CHG Soap the night before surgery and the  morning of Surgery.  2.  If you choose to wash your hair, wash your hair first as usual with your  normal  shampoo.  3.  After you shampoo, rinse your hair and body thoroughly to remove the  shampoo.                           4.  Use CHG as you would any other liquid soap.  You can apply chg directly  to the skin and wash                       Gently with a scrungie or clean washcloth.  5.  Apply the CHG Soap to your body ONLY FROM THE NECK DOWN.   Do not use on face/ open                           Wound or open sores. Avoid contact with eyes, ears mouth and genitals (private parts).                       Wash face,  Genitals (private parts) with your normal soap.             6.  Wash thoroughly, paying special attention to the area where your surgery  will be performed.  7.  Thoroughly rinse your body with warm water from the neck down.  8.  DO NOT shower/wash with your normal soap after using and rinsing off  the CHG Soap.                9.  Pat yourself dry with a clean towel.            10.  Wear clean pajamas.            11.  Place clean sheets on your bed the night of your first shower and do not  sleep with pets. Day of Surgery : Do not apply any lotions/deodorants the morning of surgery.   Please wear clean clothes to the hospital/surgery center.  FAILURE TO FOLLOW THESE INSTRUCTIONS MAY RESULT IN THE CANCELLATION OF YOUR SURGERY PATIENT SIGNATURE_________________________________  NURSE SIGNATURE__________________________________  ________________________________________________________________________

## 2018-12-24 ENCOUNTER — Ambulatory Visit
Admission: RE | Admit: 2018-12-24 | Discharge: 2018-12-24 | Disposition: A | Discharge status: Home / Self Care | Payer: Medicare HMO | Source: Ambulatory Visit | Attending: Hematology | Admitting: Hematology

## 2018-12-24 ENCOUNTER — Encounter (HOSPITAL_COMMUNITY): Payer: Self-pay

## 2018-12-24 ENCOUNTER — Telehealth: Payer: Self-pay | Admitting: *Deleted

## 2018-12-24 ENCOUNTER — Other Ambulatory Visit (HOSPITAL_COMMUNITY): Payer: Medicare HMO

## 2018-12-24 ENCOUNTER — Other Ambulatory Visit (HOSPITAL_COMMUNITY)
Admission: RE | Admit: 2018-12-24 | Discharge: 2018-12-24 | Disposition: A | Discharge status: Home / Self Care | Payer: Medicare HMO | Source: Ambulatory Visit | Attending: General Surgery | Admitting: General Surgery

## 2018-12-24 ENCOUNTER — Other Ambulatory Visit: Payer: Self-pay

## 2018-12-24 ENCOUNTER — Other Ambulatory Visit: Payer: Self-pay | Admitting: Hematology

## 2018-12-24 ENCOUNTER — Encounter (HOSPITAL_COMMUNITY)
Admission: RE | Admit: 2018-12-24 | Discharge: 2018-12-24 | Disposition: A | Discharge status: Home / Self Care | Payer: Medicare HMO | Source: Ambulatory Visit | Attending: General Surgery | Admitting: General Surgery

## 2018-12-24 DIAGNOSIS — C50411 Malignant neoplasm of upper-outer quadrant of right female breast: Secondary | ICD-10-CM

## 2018-12-24 DIAGNOSIS — Z171 Estrogen receptor negative status [ER-]: Secondary | ICD-10-CM

## 2018-12-24 DIAGNOSIS — Z1159 Encounter for screening for other viral diseases: Secondary | ICD-10-CM | POA: Insufficient documentation

## 2018-12-24 DIAGNOSIS — N6311 Unspecified lump in the right breast, upper outer quadrant: Secondary | ICD-10-CM | POA: Diagnosis not present

## 2018-12-24 DIAGNOSIS — I10 Essential (primary) hypertension: Secondary | ICD-10-CM | POA: Insufficient documentation

## 2018-12-24 DIAGNOSIS — R590 Localized enlarged lymph nodes: Secondary | ICD-10-CM | POA: Diagnosis not present

## 2018-12-24 HISTORY — DX: Malignant (primary) neoplasm, unspecified: C80.1

## 2018-12-24 LAB — SARS CORONAVIRUS 2 (TAT 6-24 HRS): SARS Coronavirus 2: NEGATIVE

## 2018-12-24 NOTE — Telephone Encounter (Signed)
  Oncology Nurse Navigator Documentation  Navigator Location: [facility]- (12/24/18 1600)   )Navigator Encounter Type: [place] Follow-up;Telephone (12/24/18 1600) Telephone: Clinic/[facility] Follow-up;Outgoing Call (12/24/18 1600)                 Treatment Initiated Date: 01/06/19 (12/24/18 1600)                                Time Spent with Patient: 30 (12/24/18 1600)

## 2018-12-24 NOTE — Progress Notes (Signed)
Pt reported that she had an EKG done 5-20 with Dr. Pennie Banter office, and message left for Dr. Pennie Banter office to faxed copy of EKG tracing. Dr. Pennie Banter office returned nurse call after patient had left PAT appt. Per Dr. Pennie Banter office, pt last EKG was 5-19. Order placed to have EKG done on day of surgery.

## 2018-12-25 ENCOUNTER — Ambulatory Visit (HOSPITAL_COMMUNITY): Payer: Medicare HMO

## 2018-12-25 ENCOUNTER — Encounter (HOSPITAL_COMMUNITY): Payer: Self-pay

## 2018-12-25 ENCOUNTER — Telehealth: Payer: Self-pay | Admitting: Hematology

## 2018-12-25 ENCOUNTER — Encounter (HOSPITAL_COMMUNITY): Payer: Medicare HMO

## 2018-12-25 ENCOUNTER — Telehealth: Payer: Self-pay | Admitting: *Deleted

## 2018-12-25 NOTE — Telephone Encounter (Signed)
Pt called requesting to talk to Dr. Burr Medico.  Stated she received more reports showing more cancer.  Pt would like to talk to Dr. Burr Medico before her [facility] insertion appt on Monday 12/28/18. Pt's    Phone     [phone].

## 2018-12-25 NOTE — Telephone Encounter (Signed)
I called pt and discussed her second right breast biopsy results, ER/PR/HER2 still pending. I also reviewed her echo results, and will refer her to cardiology for cardiac monitoring during herceptin therapy, she agrees. Her staging CT and bone scan were denied by insurance. She is scheduled for port placement next Monday, and first chemo TCHP on 7/8, I reviewed her medications, she knows to take dexa the day before chemo. All questions were answered.   [name]  12/25/2018

## 2018-12-25 NOTE — Telephone Encounter (Signed)
I have called her back.   [name] MD

## 2018-12-28 ENCOUNTER — Ambulatory Visit (HOSPITAL_COMMUNITY): Payer: Medicare HMO | Admitting: Physician Assistant

## 2018-12-28 ENCOUNTER — Encounter: Payer: Self-pay | Admitting: General Practice

## 2018-12-28 ENCOUNTER — Ambulatory Visit (HOSPITAL_COMMUNITY)
Admission: RE | Admit: 2018-12-28 | Discharge: 2018-12-28 | Disposition: A | Discharge status: Home / Self Care | Payer: Medicare HMO | Attending: General Surgery | Admitting: General Surgery

## 2018-12-28 ENCOUNTER — Other Ambulatory Visit: Payer: Self-pay

## 2018-12-28 ENCOUNTER — Ambulatory Visit (HOSPITAL_COMMUNITY): Payer: Medicare HMO | Admitting: Certified Registered"

## 2018-12-28 ENCOUNTER — Ambulatory Visit (HOSPITAL_COMMUNITY): Payer: Medicare HMO

## 2018-12-28 ENCOUNTER — Encounter (HOSPITAL_COMMUNITY): Payer: Self-pay | Admitting: *Deleted

## 2018-12-28 ENCOUNTER — Encounter (HOSPITAL_COMMUNITY)
Admission: RE | Disposition: A | Discharge status: Home / Self Care | Payer: Self-pay | Source: Home / Self Care | Attending: General Surgery

## 2018-12-28 DIAGNOSIS — C50411 Malignant neoplasm of upper-outer quadrant of right female breast: Secondary | ICD-10-CM | POA: Insufficient documentation

## 2018-12-28 DIAGNOSIS — Z171 Estrogen receptor negative status [ER-]: Secondary | ICD-10-CM | POA: Insufficient documentation

## 2018-12-28 DIAGNOSIS — Z452 Encounter for adjustment and management of vascular access device: Secondary | ICD-10-CM | POA: Diagnosis not present

## 2018-12-28 DIAGNOSIS — Z95828 Presence of other vascular implants and grafts: Secondary | ICD-10-CM

## 2018-12-28 DIAGNOSIS — Z79899 Other long term (current) drug therapy: Secondary | ICD-10-CM | POA: Diagnosis not present

## 2018-12-28 DIAGNOSIS — I10 Essential (primary) hypertension: Secondary | ICD-10-CM | POA: Diagnosis not present

## 2018-12-28 DIAGNOSIS — J45909 Unspecified asthma, uncomplicated: Secondary | ICD-10-CM | POA: Diagnosis not present

## 2018-12-28 DIAGNOSIS — C50911 Malignant neoplasm of unspecified site of right female breast: Secondary | ICD-10-CM | POA: Diagnosis not present

## 2018-12-28 DIAGNOSIS — E7800 Pure hypercholesterolemia, unspecified: Secondary | ICD-10-CM | POA: Insufficient documentation

## 2018-12-28 DIAGNOSIS — K219 Gastro-esophageal reflux disease without esophagitis: Secondary | ICD-10-CM | POA: Diagnosis not present

## 2018-12-28 HISTORY — PX: PORTACATH PLACEMENT: SHX2246

## 2018-12-28 SURGERY — INSERTION, TUNNELED CENTRAL VENOUS DEVICE, WITH PORT
Anesthesia: General | Site: Chest

## 2018-12-28 MED ORDER — BUPIVACAINE HCL (PF) 0.25 % IJ SOLN
INTRAMUSCULAR | Status: AC
  Filled 2018-12-28: qty 30

## 2018-12-28 MED ORDER — MIDAZOLAM HCL 2 MG/2ML IJ SOLN
INTRAMUSCULAR | Status: AC
  Filled 2018-12-28: qty 2

## 2018-12-28 MED ORDER — FENTANYL CITRATE (PF) 100 MCG/2ML IJ SOLN: 25.0000 ug | INTRAMUSCULAR | Status: DC | PRN

## 2018-12-28 MED ORDER — PROPOFOL 10 MG/ML IV BOLUS
INTRAVENOUS | Status: AC
  Filled 2018-12-28: qty 20

## 2018-12-28 MED ORDER — 0.9 % SODIUM CHLORIDE (POUR BTL) OPTIME
TOPICAL | Status: DC | PRN
  Administered 2018-12-28: 1000 mL

## 2018-12-28 MED ORDER — PHENYLEPHRINE 40 MCG/ML (10ML) SYRINGE FOR IV PUSH (FOR BLOOD PRESSURE SUPPORT)
PREFILLED_SYRINGE | INTRAVENOUS | Status: AC
  Filled 2018-12-28: qty 10

## 2018-12-28 MED ORDER — OXYCODONE HCL 5 MG PO TABS: 5.0000 mg | ORAL_TABLET | Freq: Four times a day (QID) | ORAL | 0 refills | Status: DC | PRN

## 2018-12-28 MED ORDER — DEXAMETHASONE SODIUM PHOSPHATE 10 MG/ML IJ SOLN
INTRAMUSCULAR | Status: AC
  Filled 2018-12-28: qty 1

## 2018-12-28 MED ORDER — FENTANYL CITRATE (PF) 100 MCG/2ML IJ SOLN
INTRAMUSCULAR | Status: AC
  Filled 2018-12-28: qty 2

## 2018-12-28 MED ORDER — ONDANSETRON HCL 4 MG/2ML IJ SOLN: 4.0000 mg | Freq: Once | INTRAMUSCULAR | Status: DC | PRN

## 2018-12-28 MED ORDER — CEFAZOLIN SODIUM-DEXTROSE 2-4 GM/100ML-% IV SOLN
2.0000 g | INTRAVENOUS | Status: AC
  Administered 2018-12-28: 2 g via INTRAVENOUS
  Filled 2018-12-28: qty 100

## 2018-12-28 MED ORDER — LIDOCAINE-EPINEPHRINE (PF) 1 %-1:200000 IJ SOLN
INTRAMUSCULAR | Status: AC
  Filled 2018-12-28: qty 30

## 2018-12-28 MED ORDER — OXYCODONE HCL 5 MG PO TABS: 5.0000 mg | ORAL_TABLET | Freq: Once | ORAL | Status: DC | PRN

## 2018-12-28 MED ORDER — LIDOCAINE-EPINEPHRINE (PF) 1 %-1:200000 IJ SOLN
INTRAMUSCULAR | Status: DC | PRN
  Administered 2018-12-28: 10 mL

## 2018-12-28 MED ORDER — ONDANSETRON HCL 4 MG/2ML IJ SOLN
INTRAMUSCULAR | Status: DC | PRN
  Administered 2018-12-28: 4 mg via INTRAVENOUS

## 2018-12-28 MED ORDER — SODIUM CHLORIDE 0.9 % IV SOLN
Freq: Once | INTRAVENOUS | Status: AC
  Administered 2018-12-28: 500 mL
  Filled 2018-12-28: qty 1.2

## 2018-12-28 MED ORDER — FENTANYL CITRATE (PF) 250 MCG/5ML IJ SOLN: INTRAMUSCULAR | Status: DC | PRN

## 2018-12-28 MED ORDER — PHENYLEPHRINE 40 MCG/ML (10ML) SYRINGE FOR IV PUSH (FOR BLOOD PRESSURE SUPPORT)
PREFILLED_SYRINGE | INTRAVENOUS | Status: DC | PRN
  Administered 2018-12-28 (×2): 80 ug via INTRAVENOUS
  Administered 2018-12-28: 120 ug via INTRAVENOUS

## 2018-12-28 MED ORDER — LIDOCAINE 2% (20 MG/ML) 5 ML SYRINGE
INTRAMUSCULAR | Status: AC
  Filled 2018-12-28: qty 5

## 2018-12-28 MED ORDER — PROPOFOL 10 MG/ML IV BOLUS
INTRAVENOUS | Status: DC | PRN
  Administered 2018-12-28: 90 mg via INTRAVENOUS
  Administered 2018-12-28: 40 mg via INTRAVENOUS

## 2018-12-28 MED ORDER — LACTATED RINGERS IV SOLN
INTRAVENOUS | Status: DC
  Administered 2018-12-28: 08:00:00 via INTRAVENOUS

## 2018-12-28 MED ORDER — CHLORHEXIDINE GLUCONATE CLOTH 2 % EX PADS: 6.0000 | MEDICATED_PAD | Freq: Once | CUTANEOUS | Status: DC

## 2018-12-28 MED ORDER — MIDAZOLAM HCL 2 MG/2ML IJ SOLN
INTRAMUSCULAR | Status: DC | PRN
  Administered 2018-12-28: 2 mg via INTRAVENOUS

## 2018-12-28 MED ORDER — ONDANSETRON HCL 4 MG/2ML IJ SOLN
INTRAMUSCULAR | Status: AC
  Filled 2018-12-28: qty 2

## 2018-12-28 MED ORDER — BUPIVACAINE-EPINEPHRINE 0.25% -1:200000 IJ SOLN
INTRAMUSCULAR | Status: DC | PRN
  Administered 2018-12-28: 10 mL

## 2018-12-28 MED ORDER — OXYCODONE HCL 5 MG/5ML PO SOLN: 5.0000 mg | Freq: Once | ORAL | Status: DC | PRN

## 2018-12-28 MED ORDER — ACETAMINOPHEN 500 MG PO TABS
1000.0000 mg | ORAL_TABLET | ORAL | Status: AC
  Administered 2018-12-28: 1000 mg via ORAL
  Filled 2018-12-28: qty 2

## 2018-12-28 MED ORDER — DEXAMETHASONE SODIUM PHOSPHATE 10 MG/ML IJ SOLN
INTRAMUSCULAR | Status: DC | PRN
  Administered 2018-12-28: 8 mg via INTRAVENOUS

## 2018-12-28 MED ORDER — HEPARIN SOD (PORK) LOCK FLUSH 100 UNIT/ML IV SOLN
INTRAVENOUS | Status: DC | PRN
  Administered 2018-12-28: 500 [IU] via INTRAVENOUS

## 2018-12-28 MED ORDER — GABAPENTIN 100 MG PO CAPS
200.0000 mg | ORAL_CAPSULE | ORAL | Status: AC
  Administered 2018-12-28: 200 mg via ORAL
  Filled 2018-12-28: qty 2

## 2018-12-28 MED ORDER — LIDOCAINE 2% (20 MG/ML) 5 ML SYRINGE
INTRAMUSCULAR | Status: DC | PRN
  Administered 2018-12-28: 40 mg via INTRAVENOUS

## 2018-12-28 MED ORDER — FENTANYL CITRATE (PF) 100 MCG/2ML IJ SOLN
INTRAMUSCULAR | Status: DC | PRN
  Administered 2018-12-28: 25 ug via INTRAVENOUS

## 2018-12-28 MED ORDER — HEPARIN SOD (PORK) LOCK FLUSH 100 UNIT/ML IV SOLN
INTRAVENOUS | Status: AC
  Filled 2018-12-28: qty 5

## 2018-12-28 SURGICAL SUPPLY — 41 items
ADH SKN CLS APL DERMABOND .7 (GAUZE/BANDAGES/DRESSINGS) ×1
APL PRP STRL LF DISP 70% ISPRP (MISCELLANEOUS) ×1
BAG DECANTER FOR FLEXI CONT (MISCELLANEOUS) ×2 IMPLANT
BLADE HEX COATED 2.75 (ELECTRODE) ×2 IMPLANT
BLADE SURG 15 STRL LF DISP TIS (BLADE) ×1 IMPLANT
BLADE SURG 15 STRL SS (BLADE) ×2
BLADE SURG SZ11 CARB STEEL (BLADE) ×2 IMPLANT
CHLORAPREP W/TINT 26 (MISCELLANEOUS) ×2 IMPLANT
COVER SURGICAL LIGHT HANDLE (MISCELLANEOUS) ×2 IMPLANT
COVER WAND RF STERILE (DRAPES) IMPLANT
DECANTER SPIKE VIAL GLASS SM (MISCELLANEOUS) ×3 IMPLANT
DERMABOND ADVANCED (GAUZE/BANDAGES/DRESSINGS) ×1
DERMABOND ADVANCED .7 DNX12 (GAUZE/BANDAGES/DRESSINGS) ×1 IMPLANT
DRAPE C-ARM 42X120 X-RAY (DRAPES) ×2 IMPLANT
DRAPE LAPAROTOMY TRNSV 102X78 (DRAPES) ×2 IMPLANT
DRAPE UTILITY XL STRL (DRAPES) ×2 IMPLANT
ELECT PENCIL ROCKER SW 15FT (MISCELLANEOUS) ×2 IMPLANT
ELECT REM PT RETURN 15FT ADLT (MISCELLANEOUS) ×2 IMPLANT
GAUZE 4X4 16PLY RFD (DISPOSABLE) ×2 IMPLANT
GLOVE BIO SURGEON STRL SZ 6 (GLOVE) ×2 IMPLANT
GLOVE BIOGEL PI IND STRL 6.5 (GLOVE) IMPLANT
GLOVE BIOGEL PI INDICATOR 6.5 (GLOVE) ×1
GLOVE ECLIPSE 6.5 STRL STRAW (GLOVE) ×1 IMPLANT
GLOVE INDICATOR 6.5 STRL GRN (GLOVE) ×2 IMPLANT
GLOVE SURG SS PI 7.0 STRL IVOR (GLOVE) ×1 IMPLANT
GOWN STRL REUS W/TWL 2XL LVL3 (GOWN DISPOSABLE) ×2 IMPLANT
GOWN STRL REUS W/TWL XL LVL3 (GOWN DISPOSABLE) ×2 IMPLANT
KIT BASIN OR (CUSTOM PROCEDURE TRAY) ×2 IMPLANT
KIT PORT POWER 8FR ISP CVUE (Port) ×1 IMPLANT
KIT TURNOVER KIT A (KITS) ×1 IMPLANT
NEEDLE HYPO 22GX1.5 SAFETY (NEEDLE) ×2 IMPLANT
PACK BASIC VI WITH GOWN DISP (CUSTOM PROCEDURE TRAY) ×2 IMPLANT
SUT MNCRL AB 4-0 PS2 18 (SUTURE) ×2 IMPLANT
SUT PROLENE 2 0 SH DA (SUTURE) ×4 IMPLANT
SUT VIC AB 3-0 SH 27 (SUTURE) ×4
SUT VIC AB 3-0 SH 27X BRD (SUTURE) ×1 IMPLANT
SYR 10ML LL (SYRINGE) ×2 IMPLANT
SYR CONTROL 10ML LL (SYRINGE) ×2 IMPLANT
TOWEL OR 17X26 10 PK STRL BLUE (TOWEL DISPOSABLE) ×2 IMPLANT
TOWEL OR NON WOVEN STRL DISP B (DISPOSABLE) ×2 IMPLANT
YANKAUER SUCT BULB TIP 10FT TU (MISCELLANEOUS) IMPLANT

## 2018-12-28 NOTE — Anesthesia Postprocedure Evaluation (Signed)
Anesthesia Post Note  Patient: Christina Sexton  Procedure(s) Performed: INSERTION PORT-A-CATH (N/A Chest)     Patient location during evaluation: PACU Anesthesia Type: General Level of consciousness: awake and alert Pain management: pain level controlled Vital Signs Assessment: post-procedure vital signs reviewed and stable Respiratory status: spontaneous breathing, nonlabored ventilation and respiratory function stable Cardiovascular status: blood pressure returned to baseline and stable Postop Assessment: no apparent nausea or vomiting Anesthetic complications: no    Last Vitals:  Vitals:   12/28/18 0945 12/28/18 1014  BP: (!) 157/81 (!) 141/74  Pulse: 72 68  Resp: 16 16  Temp: (!) 36.2 C (!) 36.2 C  SpO2: 98%     Last Pain:  Vitals:   12/28/18 1014  TempSrc:   PainSc: 0-No pain                 Lidia Collum

## 2018-12-28 NOTE — Op Note (Signed)
PREOPERATIVE DIAGNOSIS:  Right breast cancer     POSTOPERATIVE DIAGNOSIS:  Same     PROCEDURE: Left subclavian port placement, Bard ClearVue Power Port, MRI safe, 8-French.      SURGEON:  [name], MD      ANESTHESIA:  General   FINDINGS:  Good venous return, easy flush, and tip of the catheter and   SVC 22 cm.      SPECIMEN:  None.      ESTIMATED BLOOD LOSS:  Minimal.      COMPLICATIONS:  None known.      PROCEDURE:  Pt was identified in the holding area and taken to   the operating room, where patient was placed supine on the operating room   table.  General anesthesia was induced.  Patient's arms were tucked and the upper   chest and neck were prepped and draped in sterile fashion.  Time-out was   performed according to the surgical safety check list.  When all was   correct, we continued.   Local anesthetic was administered over this   area at the angle of the clavicle.  The vein was accessed with 1 pass(es) of the needle. There was good venous return and the wire passed easily with no ectopy.   Fluoroscopy was used to confirm that the wire was in the vena cava.      The patient was placed back level and the area for the pocket was anethetized   with local anesthetic.  A 3-cm transverse incision was made with a #15   blade.  Cautery was used to divide the subcutaneous tissues down to the   pectoralis muscle.  An [employer] retractor was used to elevate the skin   while a pocket was created on top of the pectoralis fascia.  The port   was placed into the pocket to confirm that it was of adequate size.  The   catheter was preattached to the port.  The port was then secured to the   pectoralis fascia with four 2-0 Prolene sutures.  These were clamped and   not tied down yet.    The catheter was tunneled through to the wire exit   site.  The catheter was placed along the wire to determine what length it should be to be in the SVC.  The catheter was cut at 22 cm.  The  tunneler sheath and dilator were passed over the wire and the dilator and wire were removed.  The catheter was advanced through the tunneler sheath and the tunneler sheath was pulled away.  Care was taken to keep the catheter in the tunneler sheath as this occurred. This was advanced and the tunneler sheath was removed.  There was good venous   return and easy flush of the catheter.  The Prolene sutures were tied   down to the pectoral fascia.  The skin was reapproximated using 3-0   Vicryl interrupted deep dermal sutures.    Fluoroscopy was used to re-confirm good position of the catheter.  The skin   was then closed using 4-0 Monocryl in a subcuticular fashion.  The port was flushed with concentrated heparin flush as well.  The wounds were then cleaned, dried, and dressed with Dermabond.  The patient was awakened from anesthesia and taken to the PACU in stable condition.  Needle, sponge, and instrument counts were correct.               [name], MD

## 2018-12-28 NOTE — Interval H&P Note (Signed)
History and Physical Interval Note:  12/28/2018 7:45 AM  Christina Sexton  has presented today for surgery, with the diagnosis of RIGHT BREAST CANCER.  The various methods of treatment have been discussed with the patient and family. After consideration of risks, benefits and other options for treatment, the patient has consented to  Procedure(s): INSERTION PORT-A-CATH WITH POSSIBLE ULTRASOUND (N/A) as a surgical intervention.  The patient's history has been reviewed, patient examined, no change in status, stable for surgery.  I have reviewed the patient's chart and labs.  Questions were answered to the patient's satisfaction.     [name]

## 2018-12-28 NOTE — Progress Notes (Signed)
[facility] Psychosocial Distress Screening Spiritual Care  LVM after two attempts to reach [facility] by phone following [facility] to introduce [facility] team/resources, reviewing distress screen per protocol.  The patient scored a 3 on the Psychosocial Distress Thermometer which indicates mild distress.   ONCBCN DISTRESS SCREENING 12/28/2018  Screening Type Initial Screening  Distress experienced in past week (1-10) 3  Family Problem type Other (comment)  Emotional problem type Adjusting to illness  Information Concerns Type Lack of info about diagnosis;Lack of info about treatment;Lack of info about complementary therapy choices;Lack of info about maintaining fitness  Referral to support programs Yes    Follow up needed: No. LVM encouraging callback. Please also page if needs arise or circumstances change. Thank you.   [place], [place], [facility] Pager [phone] Voicemail [phone]

## 2018-12-28 NOTE — H&P (Signed)
Christina Sexton Documented: 12/16/2018 7:16 AM Location: [facility] Patient #: [id number] DOB: 1950-08-02 Undefined / Language: Undefined / Race: Refused to Report/Unreported Female   History of Present Illness Christina Klein MD; 12/26/2018 7:36 PM) The patient is a 68 year old female who presents with breast cancer. Pt is a 68 yo F who presents with new right breast cancer dx 11/2018. She had a palpable mass for around 3 months. She felt that it was growing. She was eventually able to get dx imaging. This showed a 3 cm mass at 11 o'clock. The mass was firm to her touch. She denied any significant pain in the mass region. She had a core needle biopsy showing a grade 3 invasive ductal carcinoma, ER/PR -, Her 2 positive. Ki 67 was 30%.   She had a benign excisional biopsy in the past, but has not had cancer before. She had a brother with colon cancer. She is a non smoker. She had menarche at age 59. She had her last period 10 years ago. She never used HRT. She is nulliparous. She is up to date with bone scan, colonoscopy, and PAP smear.    dx mammo/us 12/07/2018 COMPARISON: Previous exam(s).  ACR Breast Density Category c: The breast tissue is heterogeneously dense, which may obscure small masses.  FINDINGS: There is an irregular mass with associated pleomorphic calcifications and architectural distortion in the upper, slightly outer aspect right breast, with adjacent postsurgical scarring.  In the left breast, there is a small stable mass in the lower inner aspect of the breast with an adjacent ribbon shaped biopsy clip.  There are no other discrete masses and no other areas of architectural distortion.  Mammographic images were processed with CAD.  On physical exam, there is a firm mobile mass in the upper right breast.  Targeted ultrasound is performed, showing a hypoechoic irregular mass with partly ill-defined margins in the right breast at  11 o'clock, 4 cm the nipple, measuring 3 x 2 x 2 cm, corresponding to the palpable abnormality. Mass shows internal blood flow on color Doppler analysis.  Sonographic evaluation of the right axilla shows no enlarged or abnormal lymph nodes.  IMPRESSION: 1. 3 cm mass in the 11 o'clock position of the right breast, highly suspicious for breast carcinoma. No enlarged or abnormal right axillary lymph nodes.  RECOMMENDATION: Ultrasound-guided core needle biopsy of the right breast mass. This procedure has been scheduled for later this week.  I have discussed the findings and recommendations with the patient. Results were also provided in writing at the conclusion of the visit. If applicable, a reminder letter will be sent to the patient regarding the next appointment.  BI-RADS CATEGORY 5: Highly suggestive of malignancy.   pathology 12/09/18 Diagnosis Breast, right, needle core biopsy, 11 o'clock - INVASIVE DUCTAL CARCINOMA. - DUCTAL CARCINOMA IN SITU. - SEE COMMENT. Microscopic Comment The carcinoma appears grade III. The tumor cells are POSITIVE for Her2 (3+). Estrogen Receptor: 0%, NEGATIVE Progesterone Receptor: 0%, NEGATIVE Proliferation Marker Ki67: 30%  CMET, CBC essentially nl 12/16/18   Past Surgical History Tawni Pummel, RN; 12/16/2018 7:16 AM) Breast Biopsy  Right. multiple Oral Surgery  Shoulder Surgery  Right.  Diagnostic Studies History Tawni Pummel, RN; 12/16/2018 7:16 AM) Colonoscopy  1-5 years ago Mammogram  within last year Pap Smear  1-5 years ago  Medication History Tawni Pummel, RN; 12/16/2018 7:16 AM) Medications Reconciled  Social History Tawni Pummel, RN; 12/16/2018 7:16 AM) Alcohol use  Occasional alcohol use. Caffeine  use  Coffee. No drug use  Tobacco use  Never smoker.  Family History Tawni Pummel, RN; 12/16/2018 7:16 AM) Colon Cancer  Brother. Depression  Brother, Mother. Diabetes Mellitus  Father. Heart  Disease  Brother, Family Members In [place], Father. Heart disease in female family member before age 30  Hypertension  Brother, Family Members In [place], Father. Kidney Disease  Father. Respiratory Condition  Mother.  Pregnancy / Birth History Tawni Pummel, RN; 12/16/2018 7:16 AM) Age at menarche  42 years. Age of menopause  19-55 Gravida  0 Irregular periods  Para  0  Other Problems Tawni Pummel, RN; 12/16/2018 7:16 AM) Asthma  Hemorrhoids  Hypercholesterolemia  Kidney Stone  Migraine Headache     Review of Systems Sunday Spillers Ledford RN; 12/16/2018 7:16 AM) General Not Present- Appetite Loss, Chills, Fatigue, Fever, Night Sweats, Weight Gain and Weight Loss. Skin Not Present- Change in Wart/Mole, Dryness, Hives, Jaundice, New Lesions, Non-Healing Wounds, Rash and Ulcer. HEENT Present- Seasonal Allergies. Not Present- Earache, Hearing Loss, Hoarseness, Nose Bleed, Oral Ulcers, Ringing in the Ears, Sinus Pain, Sore Throat, Visual Disturbances, Wears glasses/contact lenses and Yellow Eyes. Respiratory Not Present- Bloody sputum, Chronic Cough, Difficulty Breathing, Snoring and Wheezing. Breast Not Present- Breast Mass, Breast Pain, Nipple Discharge and Skin Changes. Cardiovascular Not Present- Chest Pain, Difficulty Breathing Lying Down, Leg Cramps, Palpitations, Rapid Heart Rate, Shortness of Breath and Swelling of Extremities. Gastrointestinal Not Present- Abdominal Pain, Bloating, Bloody Stool, Change in Bowel Habits, Chronic diarrhea, Constipation, Difficulty Swallowing, Excessive gas, Gets full quickly at meals, Hemorrhoids, Indigestion, Nausea, Rectal Pain and Vomiting. Female Genitourinary Not Present- Frequency, Nocturia, Painful Urination, Pelvic Pain and Urgency. Musculoskeletal Not Present- Back Pain, Joint Pain, Joint Stiffness, Muscle Pain, Muscle Weakness and Swelling of Extremities. Neurological Not Present- Decreased Memory, Fainting, Headaches, Numbness,  Seizures, Tingling, Tremor, Trouble walking and Weakness. Psychiatric Not Present- Anxiety, Bipolar, Change in Sleep Pattern, Depression, Fearful and Frequent crying. Endocrine Present- Hot flashes. Not Present- Cold Intolerance, Excessive Hunger, Hair Changes, Heat Intolerance and New Diabetes. Hematology Not Present- Blood Thinners, Easy Bruising, Excessive bleeding, Gland problems, HIV and Persistent Infections.  Vitals Christina Klein MD; 12/26/2018 7:30 PM) 12/26/2018 7:30 PM Weight: 101 lb Height: 62in Body Surface Area: 1.43 m Body Mass Index: 18.47 kg/m  Temp.: 98.59F  Pulse: 98 (Regular)  Resp.: 18 (Unlabored)  BP: 138/73(Sitting, Left Arm, Standard)       Physical Exam Christina Klein MD; 12/26/2018 7:37 PM) General Mental Status-Alert. General Appearance-Consistent with stated age. Hydration-Well hydrated. Voice-Normal.  Head and Neck Head-normocephalic, atraumatic with no lesions or palpable masses. Trachea-midline. Thyroid Gland Characteristics - normal size and consistency.  Eye Eyeball - Bilateral-Extraocular movements intact. Sclera/Conjunctiva - Bilateral-No scleral icterus.  Chest and Lung Exam Chest and lung exam reveals -quiet, even and easy respiratory effort with no use of accessory muscles and on auscultation, normal breath sounds, no adventitious sounds and normal vocal resonance. Inspection Chest Wall - Normal. Back - normal.  Breast Note: palpable mass upper outer quadrant 3-4 cm on right breast. mobile. non tender. some bruising. no nipple retraction or nipple discharge. no LAD. breasts relatively symmetric except for fullness in the UOQ in region of tumor.   Cardiovascular Cardiovascular examination reveals -normal heart sounds, regular rate and rhythm with no murmurs and normal pedal pulses bilaterally.  Abdomen Inspection Inspection of the abdomen reveals - No Hernias. Palpation/Percussion Palpation and  Percussion of the abdomen reveal - Soft, Non Tender, No Rebound tenderness, No Rigidity (guarding) and No hepatosplenomegaly. Auscultation  Auscultation of the abdomen reveals - Bowel sounds normal.  Neurologic Neurologic evaluation reveals -alert and oriented x 3 with no impairment of recent or remote memory. Mental Status-Normal.  Musculoskeletal Global Assessment -Note: no gross deformities.  Normal Exam - Left-Upper Extremity Strength Normal and Lower Extremity Strength Normal. Normal Exam - Right-Upper Extremity Strength Normal and Lower Extremity Strength Normal.  Lymphatic Head & Neck  General Head & Neck Lymphatics: Bilateral - Description - Normal. Axillary  General Axillary Region: Bilateral - Description - Normal. Tenderness - Non Tender. Femoral & Inguinal  Generalized Femoral & Inguinal Lymphatics: Bilateral - Description - No Generalized lymphadenopathy.    Assessment & Plan Christina Klein MD; 12/26/2018 7:41 PM)  MALIGNANT NEOPLASM OF UPPER-OUTER QUADRANT OF RIGHT BREAST IN FEMALE, ESTROGEN RECEPTOR NEGATIVE (C50.411) Impression: Patient has a new diagnosis of right clinical T2N0 breast cancer. Because of the prognostic profile in the size, we recommend neoadjuvant chemotherapy. She will be scheduled for a breast MRI as well as staging studies and echocardiogram. I discussed Port-A-Cath placement with the patient including risks.  We discussed pros and cons of neoadjuvant chemotherapy versus adjuvant chemotherapy. We discussed that chemotherapy treats the entire body upfront. I advised the patient that this size and type of tumor has a high incidence of recurrence without chemotherapy and that that recurrence could be in areas of the body that are not the breast. I reviewed that trying to treat distant recurrence is much more difficult than upfront chemotherapy in terms of survival.  I discussed that there is not a survival difference between neoadjuvant and  adjuvant chemotherapy. I reviewed that if she has a complete pathologic response that it gives her a much better prognosis.  After chemotherapy and restaging is performed, she will likely be a reasonable candidate for breast conservation unless her MRI shows different findings. If she does have breast conservation or positive lymph nodes, she will need adjuvant radiation.    Signed by Christina Klein, MD (12/26/2018 7:42 PM)

## 2018-12-28 NOTE — Anesthesia Preprocedure Evaluation (Addendum)
Anesthesia Evaluation  Patient identified by MRN, date of birth, ID band Patient awake    Reviewed: Allergy & Precautions, NPO status , Patient's Chart, lab work & pertinent test results  History of Anesthesia Complications Negative for: history of anesthetic complications  Airway Mallampati: II  TM Distance: >3 FB Neck ROM: Full    Dental  (+) Teeth Intact   Pulmonary asthma ,    Pulmonary exam normal        Cardiovascular hypertension, Normal cardiovascular exam     Neuro/Psych negative neurological ROS  negative psych ROS   GI/Hepatic Neg liver ROS, GERD  ,  Endo/Other  negative endocrine ROS  Renal/GU Renal InsufficiencyRenal disease  negative genitourinary   Musculoskeletal negative musculoskeletal ROS (+)   Abdominal   Peds  Hematology negative hematology ROS (+)   Anesthesia Other Findings Echo 12/22/18: EF 60-65%, Mild thickening of the mitral valve leaflet. Mitral valve regurgitation is trivial.   Reproductive/Obstetrics                            Anesthesia Physical Anesthesia Plan  ASA: II  Anesthesia Plan: General   Post-op Pain Management:    Induction: Intravenous  PONV Risk Score and Plan: 3 and Ondansetron, Dexamethasone, Midazolam and Treatment may vary due to age or medical condition  Airway Management Planned: LMA  Additional Equipment: None  Intra-op Plan:   Post-operative Plan: Extubation in OR  Informed Consent: I have reviewed the patients History and Physical, chart, labs and discussed the procedure including the risks, benefits and alternatives for the proposed anesthesia with the patient or authorized representative who has indicated his/her understanding and acceptance.     Dental advisory given  Plan Discussed with:   Anesthesia Plan Comments:        Anesthesia Quick Evaluation

## 2018-12-28 NOTE — Transfer of Care (Signed)
Immediate Anesthesia Transfer of Care Note  Patient: Christina Sexton  Procedure(s) Performed: INSERTION PORT-A-CATH (N/A Chest)  Patient Location: PACU  Anesthesia Type:General  Level of Consciousness: awake, alert  and oriented  Airway & Oxygen Therapy: Patient Spontanous Breathing and Patient connected to face mask oxygen  Post-op Assessment: Report given to RN and Post -op Vital signs reviewed and stable  Post vital signs: Reviewed and stable  Last Vitals:  Vitals Value Taken Time  BP 150/77 12/28/18 0902  Temp    Pulse 68 12/28/18 0903  Resp 13 12/28/18 0903  SpO2 100 % 12/28/18 0903  Vitals shown include unvalidated device data.  Last Pain:  Vitals:   12/28/18 0549  TempSrc:   PainSc: 0-No pain         Complications: No apparent anesthesia complications

## 2018-12-28 NOTE — Discharge Instructions (Signed)
[facility] °Office Phone Number [phone] ° ° POST OP INSTRUCTIONS ° °Always review your discharge instruction sheet given to you by the facility where your surgery was performed. ° °IF YOU HAVE DISABILITY OR FAMILY LEAVE FORMS, YOU MUST BRING THEM TO THE OFFICE FOR PROCESSING.  DO NOT GIVE THEM TO YOUR DOCTOR. ° °1. A prescription for pain medication may be given to you upon discharge.  Take your pain medication as prescribed, if needed.  If narcotic pain medicine is not needed, then you may take acetaminophen (Tylenol) or ibuprofen (Advil) as needed. °2. Take your usually prescribed medications unless otherwise directed °3. If you need a refill on your pain medication, please contact your pharmacy.  They will contact our office to request authorization.  Prescriptions will not be filled after 5pm or on week-ends. °4. You should eat very light the first 24 hours after surgery, such as soup, crackers, pudding, etc.  Resume your normal diet the day after surgery °5. It is common to experience some constipation if taking pain medication after surgery.  Increasing fluid intake and taking a stool softener will usually help or prevent this problem from occurring.  A mild laxative (Milk of Magnesia or Miralax) should be taken according to package directions if there are no bowel movements after 48 hours. °6. You may shower in 48 hours.  The surgical glue will flake off in 2-3 weeks.   °7. ACTIVITIES:  No strenuous activity or heavy lifting for 1 week.   °a. You may drive when you no longer are taking prescription pain medication, you can comfortably wear a seatbelt, and you can safely maneuver your car and apply brakes. °b. RETURN TO WORK:  __________to be determined._______________ °You should see your doctor in the office for a follow-up appointment approximately three-four weeks after your surgery.   ° °WHEN TO CALL YOUR DOCTOR: °1. Fever over 101.0 °2. Nausea and/or vomiting. °3. Extreme swelling  or bruising. °4. Continued bleeding from incision. °5. Increased pain, redness, or drainage from the incision. ° °The clinic staff is available to answer your questions during regular business hours.  Please don’t hesitate to call and ask to speak to one of the nurses for clinical concerns.  If you have a medical emergency, go to the nearest emergency room or call 911.  A surgeon from [facility] is always on call at the hospital. ° °For further questions, please visit [email or web address]  ° °

## 2018-12-28 NOTE — Anesthesia Procedure Notes (Signed)
Procedure Name: LMA Insertion Date/Time: 12/28/2018 7:58 AM Performed by: Niel Hummer, CRNA Pre-anesthesia Checklist: Patient identified, Emergency Drugs available, Suction available and Patient being monitored Patient Re-evaluated:Patient Re-evaluated prior to induction Oxygen Delivery Method: Circle system utilized Preoxygenation: Pre-oxygenation with 100% oxygen Induction Type: IV induction Ventilation: Mask ventilation without difficulty LMA: LMA inserted LMA Size: 3.0 Dental Injury: Teeth and Oropharynx as per pre-operative assessment

## 2018-12-29 ENCOUNTER — Encounter (HOSPITAL_COMMUNITY): Payer: Self-pay | Admitting: General Surgery

## 2019-01-06 ENCOUNTER — Encounter: Payer: Self-pay | Admitting: *Deleted

## 2019-01-06 ENCOUNTER — Inpatient Hospital Stay: Payer: Medicare HMO | Attending: Hematology

## 2019-01-06 ENCOUNTER — Other Ambulatory Visit: Payer: Medicare HMO

## 2019-01-06 ENCOUNTER — Other Ambulatory Visit: Payer: Self-pay

## 2019-01-06 ENCOUNTER — Inpatient Hospital Stay: Payer: Medicare HMO

## 2019-01-06 ENCOUNTER — Telehealth: Payer: Self-pay | Admitting: Nurse Practitioner

## 2019-01-06 ENCOUNTER — Inpatient Hospital Stay (HOSPITAL_BASED_OUTPATIENT_CLINIC_OR_DEPARTMENT_OTHER): Payer: Medicare HMO | Admitting: Nurse Practitioner

## 2019-01-06 ENCOUNTER — Encounter: Payer: Self-pay | Admitting: Nurse Practitioner

## 2019-01-06 VITALS — BP 128/71 | HR 78 | Temp 99.1°F | Resp 18 | Ht 63.0 in | Wt 102.6 lb

## 2019-01-06 VITALS — BP 133/80 | HR 76 | Temp 98.5°F | Resp 17

## 2019-01-06 DIAGNOSIS — G4700 Insomnia, unspecified: Secondary | ICD-10-CM | POA: Insufficient documentation

## 2019-01-06 DIAGNOSIS — D696 Thrombocytopenia, unspecified: Secondary | ICD-10-CM | POA: Diagnosis not present

## 2019-01-06 DIAGNOSIS — C50411 Malignant neoplasm of upper-outer quadrant of right female breast: Secondary | ICD-10-CM | POA: Diagnosis not present

## 2019-01-06 DIAGNOSIS — Z171 Estrogen receptor negative status [ER-]: Secondary | ICD-10-CM | POA: Diagnosis not present

## 2019-01-06 DIAGNOSIS — K5900 Constipation, unspecified: Secondary | ICD-10-CM | POA: Insufficient documentation

## 2019-01-06 DIAGNOSIS — R5383 Other fatigue: Secondary | ICD-10-CM | POA: Diagnosis not present

## 2019-01-06 DIAGNOSIS — Z5111 Encounter for antineoplastic chemotherapy: Secondary | ICD-10-CM | POA: Insufficient documentation

## 2019-01-06 DIAGNOSIS — D72819 Decreased white blood cell count, unspecified: Secondary | ICD-10-CM | POA: Diagnosis not present

## 2019-01-06 DIAGNOSIS — Z95828 Presence of other vascular implants and grafts: Secondary | ICD-10-CM

## 2019-01-06 DIAGNOSIS — Z79899 Other long term (current) drug therapy: Secondary | ICD-10-CM | POA: Diagnosis not present

## 2019-01-06 LAB — CBC WITH DIFFERENTIAL (CANCER CENTER ONLY)
Abs Immature Granulocytes: 0.15 10*3/uL — ABNORMAL HIGH (ref 0.00–0.07)
Basophils Absolute: 0 10*3/uL (ref 0.0–0.1)
Basophils Relative: 0 %
Eosinophils Absolute: 0 10*3/uL (ref 0.0–0.5)
Eosinophils Relative: 0 %
HCT: 39.6 % (ref 36.0–46.0)
Hemoglobin: 12.8 g/dL (ref 12.0–15.0)
Immature Granulocytes: 1 %
Lymphocytes Relative: 6 %
Lymphs Abs: 0.8 10*3/uL (ref 0.7–4.0)
MCH: 29.8 pg (ref 26.0–34.0)
MCHC: 32.3 g/dL (ref 30.0–36.0)
MCV: 92.3 fL (ref 80.0–100.0)
Monocytes Absolute: 1.1 10*3/uL — ABNORMAL HIGH (ref 0.1–1.0)
Monocytes Relative: 8 %
Neutro Abs: 12.5 10*3/uL — ABNORMAL HIGH (ref 1.7–7.7)
Neutrophils Relative %: 85 %
Platelet Count: 159 10*3/uL (ref 150–400)
RBC: 4.29 MIL/uL (ref 3.87–5.11)
RDW: 12.9 % (ref 11.5–15.5)
WBC Count: 14.6 10*3/uL — ABNORMAL HIGH (ref 4.0–10.5)
nRBC: 0 % (ref 0.0–0.2)

## 2019-01-06 LAB — CMP (CANCER CENTER ONLY)
ALT: 24 U/L (ref 0–44)
AST: 19 U/L (ref 15–41)
Albumin: 4 g/dL (ref 3.5–5.0)
Alkaline Phosphatase: 56 U/L (ref 38–126)
Anion gap: 11 (ref 5–15)
BUN: 21 mg/dL (ref 8–23)
CO2: 23 mmol/L (ref 22–32)
Calcium: 9.5 mg/dL (ref 8.9–10.3)
Chloride: 107 mmol/L (ref 98–111)
Creatinine: 0.81 mg/dL (ref 0.44–1.00)
GFR, Est AFR Am: >60 mL/min (ref >60)
GFR, Estimated: >60 mL/min (ref >60)
Glucose, Bld: 127 mg/dL — ABNORMAL HIGH (ref 70–99)
Potassium: 3.5 mmol/L (ref 3.5–5.1)
Sodium: 141 mmol/L (ref 135–145)
Total Bilirubin: 0.5 mg/dL (ref 0.3–1.2)
Total Protein: 7 g/dL (ref 6.5–8.1)

## 2019-01-06 MED ORDER — SODIUM CHLORIDE 0.9 % IV SOLN
Freq: Once | INTRAVENOUS | Status: AC
  Administered 2019-01-06: 15:00:00 via INTRAVENOUS
  Filled 2019-01-06: qty 5

## 2019-01-06 MED ORDER — SODIUM CHLORIDE 0.9 % IV SOLN
390.0000 mg | Freq: Once | INTRAVENOUS | Status: AC
  Administered 2019-01-06: 17:00:00 390 mg via INTRAVENOUS
  Filled 2019-01-06: qty 39

## 2019-01-06 MED ORDER — ACETAMINOPHEN 325 MG PO TABS
ORAL_TABLET | ORAL | Status: AC
  Filled 2019-01-06: qty 2

## 2019-01-06 MED ORDER — HEPARIN SOD (PORK) LOCK FLUSH 100 UNIT/ML IV SOLN
500.0000 [IU] | Freq: Once | INTRAVENOUS | Status: AC | PRN
  Administered 2019-01-06: 18:00:00 500 [IU]
  Filled 2019-01-06: qty 5

## 2019-01-06 MED ORDER — PALONOSETRON HCL INJECTION 0.25 MG/5ML
INTRAVENOUS | Status: AC
  Filled 2019-01-06: qty 5

## 2019-01-06 MED ORDER — TRASTUZUMAB-ANNS CHEMO 420 MG IV SOLR
8.0000 mg/kg | Freq: Once | INTRAVENOUS | Status: AC
  Administered 2019-01-06: 357 mg via INTRAVENOUS
  Filled 2019-01-06: qty 17

## 2019-01-06 MED ORDER — PALONOSETRON HCL INJECTION 0.25 MG/5ML
0.2500 mg | Freq: Once | INTRAVENOUS | Status: AC
  Administered 2019-01-06: 15:00:00 0.25 mg via INTRAVENOUS

## 2019-01-06 MED ORDER — SODIUM CHLORIDE 0.9% FLUSH
10.0000 mL | Freq: Once | INTRAVENOUS | Status: AC
  Administered 2019-01-06: 10 mL
  Filled 2019-01-06: qty 10

## 2019-01-06 MED ORDER — SODIUM CHLORIDE 0.9 % IV SOLN
Freq: Once | INTRAVENOUS | Status: AC
  Administered 2019-01-06: 10:00:00 via INTRAVENOUS
  Filled 2019-01-06: qty 250

## 2019-01-06 MED ORDER — SODIUM CHLORIDE 0.9% FLUSH
10.0000 mL | INTRAVENOUS | Status: DC | PRN
  Administered 2019-01-06: 10 mL
  Filled 2019-01-06: qty 10

## 2019-01-06 MED ORDER — ACETAMINOPHEN 325 MG PO TABS
650.0000 mg | ORAL_TABLET | Freq: Once | ORAL | Status: AC
  Administered 2019-01-06: 650 mg via ORAL

## 2019-01-06 MED ORDER — SODIUM CHLORIDE 0.9 % IV SOLN
75.0000 mg/m2 | Freq: Once | INTRAVENOUS | Status: AC
  Administered 2019-01-06: 110 mg via INTRAVENOUS
  Filled 2019-01-06: qty 11

## 2019-01-06 MED ORDER — DIPHENHYDRAMINE HCL 25 MG PO CAPS
ORAL_CAPSULE | ORAL | Status: AC
  Filled 2019-01-06: qty 2

## 2019-01-06 MED ORDER — SODIUM CHLORIDE 0.9 % IV SOLN
840.0000 mg | Freq: Once | INTRAVENOUS | Status: AC
  Administered 2019-01-06: 840 mg via INTRAVENOUS
  Filled 2019-01-06: qty 28

## 2019-01-06 MED ORDER — DIPHENHYDRAMINE HCL 25 MG PO CAPS
50.0000 mg | ORAL_CAPSULE | Freq: Once | ORAL | Status: AC
  Administered 2019-01-06: 50 mg via ORAL

## 2019-01-06 NOTE — Patient Instructions (Signed)
[facility] Discharge Instructions for Patients Receiving Chemotherapy  Today you received the following chemotherapy agents Kanjinti, Perjeta, Taxotere, Carboplatin.  To help prevent nausea and vomiting after your treatment, we encourage you to take your nausea medication as directed.  If you develop nausea and vomiting that is not controlled by your nausea medication, call the clinic.   BELOW ARE SYMPTOMS THAT SHOULD BE REPORTED IMMEDIATELY:  *FEVER GREATER THAN 100.5 F  *CHILLS WITH OR WITHOUT FEVER  NAUSEA AND VOMITING THAT IS NOT CONTROLLED WITH YOUR NAUSEA MEDICATION  *UNUSUAL SHORTNESS OF BREATH  *UNUSUAL BRUISING OR BLEEDING  TENDERNESS IN MOUTH AND THROAT WITH OR WITHOUT PRESENCE OF ULCERS  *URINARY PROBLEMS  *BOWEL PROBLEMS  UNUSUAL RASH Items with * indicate a potential emergency and should be followed up as soon as possible.  Feel free to call the clinic should you have any questions or concerns. The clinic phone number is [phone].  Please show the [facility] at check-in to the Emergency Department and triage nurse.  Trastuzumab injection for infusion What is this medicine? TRASTUZUMAB (tras TOO zoo mab) is a monoclonal antibody. It is used to treat breast cancer and stomach cancer. This medicine may be used for other purposes; ask your health care provider or pharmacist if you have questions. COMMON BRAND NAME(S): Herceptin, Galvin Proffer, Trazimera What should I tell my health care provider before I take this medicine? They need to know if you have any of these conditions:  heart disease  heart failure  lung or breathing disease, like asthma  an unusual or allergic reaction to trastuzumab, benzyl alcohol, or other medications, foods, dyes, or preservatives  pregnant or trying to get pregnant  breast-feeding How should I use this medicine? This drug is given as an infusion into a vein. It is  administered in a hospital or clinic by a specially trained health care professional. Talk to your pediatrician regarding the use of this medicine in children. This medicine is not approved for use in children. Overdosage: If you think you have taken too much of this medicine contact a poison control center or emergency room at once. NOTE: This medicine is only for you. Do not share this medicine with others. What if I miss a dose? It is important not to miss a dose. Call your doctor or health care professional if you are unable to keep an appointment. What may interact with this medicine? This medicine may interact with the following medications:  certain types of chemotherapy, such as daunorubicin, doxorubicin, epirubicin, and idarubicin This list may not describe all possible interactions. Give your health care provider a list of all the medicines, herbs, non-prescription drugs, or dietary supplements you use. Also tell them if you smoke, drink alcohol, or use illegal drugs. Some items may interact with your medicine. What should I watch for while using this medicine? Visit your doctor for checks on your progress. Report any side effects. Continue your course of treatment even though you feel ill unless your doctor tells you to stop. Call your doctor or health care professional for advice if you get a fever, chills or sore throat, or other symptoms of a cold or flu. Do not treat yourself. Try to avoid being around people who are sick. You may experience fever, chills and shaking during your first infusion. These effects are usually mild and can be treated with other medicines. Report any side effects during the infusion to your health care professional. Fever  and chills usually do not happen with later infusions. Do not become pregnant while taking this medicine or for 7 months after stopping it. Women should inform their doctor if they wish to become pregnant or think they might be pregnant. Women  of child-bearing potential will need to have a negative pregnancy test before starting this medicine. There is a potential for serious side effects to an unborn child. Talk to your health care professional or pharmacist for more information. Do not breast-feed an infant while taking this medicine or for 7 months after stopping it. Women must use effective birth control with this medicine. What side effects may I notice from receiving this medicine? Side effects that you should report to your doctor or health care professional as soon as possible:  allergic reactions like skin rash, itching or hives, swelling of the face, lips, or tongue  chest pain or palpitations  cough  dizziness  feeling faint or lightheaded, falls  fever  general ill feeling or flu-like symptoms  signs of worsening heart failure like breathing problems; swelling in your legs and feet  unusually weak or tired Side effects that usually do not require medical attention (report to your doctor or health care professional if they continue or are bothersome):  bone pain  changes in taste  diarrhea  joint pain  nausea/vomiting  weight loss This list may not describe all possible side effects. Call your doctor for medical advice about side effects. You may report side effects to FDA at 1-800-FDA-1088. Where should I keep my medicine? This drug is given in a hospital or clinic and will not be stored at home. NOTE: This sheet is a summary. It may not cover all possible information. If you have questions about this medicine, talk to your doctor, pharmacist, or health care provider.  2020 Elsevier/Gold Standard (2016-06-11 14:37:52)  Pertuzumab injection What is this medicine? PERTUZUMAB (per TOOZ ue mab) is a monoclonal antibody. It is used to treat breast cancer. This medicine may be used for other purposes; ask your health care provider or pharmacist if you have questions. COMMON BRAND NAME(S): PERJETA What  should I tell my health care provider before I take this medicine? They need to know if you have any of these conditions:  heart disease  heart failure  high blood pressure  history of irregular heart beat  recent or ongoing radiation therapy  an unusual or allergic reaction to pertuzumab, other medicines, foods, dyes, or preservatives  pregnant or trying to get pregnant  breast-feeding How should I use this medicine? This medicine is for infusion into a vein. It is given by a health care professional in a hospital or clinic setting. Talk to your pediatrician regarding the use of this medicine in children. Special care may be needed. Overdosage: If you think you have taken too much of this medicine contact a poison control center or emergency room at once. NOTE: This medicine is only for you. Do not share this medicine with others. What if I miss a dose? It is important not to miss your dose. Call your doctor or health care professional if you are unable to keep an appointment. What may interact with this medicine? Interactions are not expected. Give your health care provider a list of all the medicines, herbs, non-prescription drugs, or dietary supplements you use. Also tell them if you smoke, drink alcohol, or use illegal drugs. Some items may interact with your medicine. This list may not describe all possible interactions. Give  your health care provider a list of all the medicines, herbs, non-prescription drugs, or dietary supplements you use. Also tell them if you smoke, drink alcohol, or use illegal drugs. Some items may interact with your medicine. What should I watch for while using this medicine? Your condition will be monitored carefully while you are receiving this medicine. Report any side effects. Continue your course of treatment even though you feel ill unless your doctor tells you to stop. Do not become pregnant while taking this medicine or for 7 months after stopping  it. Women should inform their doctor if they wish to become pregnant or think they might be pregnant. Women of child-bearing potential will need to have a negative pregnancy test before starting this medicine. There is a potential for serious side effects to an unborn child. Talk to your health care professional or pharmacist for more information. Do not breast-feed an infant while taking this medicine or for 7 months after stopping it. Women must use effective birth control with this medicine. Call your doctor or health care professional for advice if you get a fever, chills or sore throat, or other symptoms of a cold or flu. Do not treat yourself. Try to avoid being around people who are sick. You may experience fever, chills, and headache during the infusion. Report any side effects during the infusion to your health care professional. What side effects may I notice from receiving this medicine? Side effects that you should report to your doctor or health care professional as soon as possible:  breathing problems  chest pain or palpitations  dizziness  feeling faint or lightheaded  fever or chills  skin rash, itching or hives  sore throat  swelling of the face, lips, or tongue  swelling of the legs or ankles  unusually weak or tired Side effects that usually do not require medical attention (report to your doctor or health care professional if they continue or are bothersome):  diarrhea  hair loss  nausea, vomiting  tiredness This list may not describe all possible side effects. Call your doctor for medical advice about side effects. You may report side effects to FDA at 1-800-FDA-1088. Where should I keep my medicine? This drug is given in a hospital or clinic and will not be stored at home. NOTE: This sheet is a summary. It may not cover all possible information. If you have questions about this medicine, talk to your doctor, pharmacist, or health care provider.  2020  Elsevier/Gold Standard (2015-07-20 12:08:50)    Docetaxel injection What is this medicine? DOCETAXEL (doe se TAX el) is a chemotherapy drug. It targets fast dividing cells, like cancer cells, and causes these cells to die. This medicine is used to treat many types of cancers like breast cancer, certain stomach cancers, head and neck cancer, lung cancer, and prostate cancer. This medicine may be used for other purposes; ask your health care provider or pharmacist if you have questions. COMMON BRAND NAME(S): Docefrez, Taxotere What should I tell my health care provider before I take this medicine? They need to know if you have any of these conditions:  infection (especially a virus infection such as chickenpox, cold sores, or herpes)  liver disease  low blood counts, like low white cell, platelet, or red cell counts  an unusual or allergic reaction to docetaxel, polysorbate 80, other chemotherapy agents, other medicines, foods, dyes, or preservatives  pregnant or trying to get pregnant  breast-feeding How should I use this medicine? This drug  is given as an infusion into a vein. It is administered in a hospital or clinic by a specially trained health care professional. Talk to your pediatrician regarding the use of this medicine in children. Special care may be needed. Overdosage: If you think you have taken too much of this medicine contact a poison control center or emergency room at once. NOTE: This medicine is only for you. Do not share this medicine with others. What if I miss a dose? It is important not to miss your dose. Call your doctor or health care professional if you are unable to keep an appointment. What may interact with this medicine?  aprepitant  certain antibiotics like erythromycin or clarithromycin  certain antivirals for HIV or hepatitis  certain medicines for fungal infections like fluconazole, itraconazole, ketoconazole, posaconazole, or  voriconazole  cimetidine  ciprofloxacin  conivaptan  cyclosporine  dronedarone  fluvoxamine  grapefruit juice  imatinib  verapamil This list may not describe all possible interactions. Give your health care provider a list of all the medicines, herbs, non-prescription drugs, or dietary supplements you use. Also tell them if you smoke, drink alcohol, or use illegal drugs. Some items may interact with your medicine. What should I watch for while using this medicine? Your condition will be monitored carefully while you are receiving this medicine. You will need important blood work done while you are taking this medicine. Call your doctor or health care professional for advice if you get a fever, chills or sore throat, or other symptoms of a cold or flu. Do not treat yourself. This drug decreases your body's ability to fight infections. Try to avoid being around people who are sick. Some products may contain alcohol. Ask your health care professional if this medicine contains alcohol. Be sure to tell all health care professionals you are taking this medicine. Certain medicines, like metronidazole and disulfiram, can cause an unpleasant reaction when taken with alcohol. The reaction includes flushing, headache, nausea, vomiting, sweating, and increased thirst. The reaction can last from 30 minutes to several hours. You may get drowsy or dizzy. Do not drive, use machinery, or do anything that needs mental alertness until you know how this medicine affects you. Do not stand or sit up quickly, especially if you are an older patient. This reduces the risk of dizzy or fainting spells. Alcohol may interfere with the effect of this medicine. Talk to your health care professional about your risk of cancer. You may be more at risk for certain types of cancer if you take this medicine. Do not become pregnant while taking this medicine or for 6 months after stopping it. Women should inform their doctor if  they wish to become pregnant or think they might be pregnant. There is a potential for serious side effects to an unborn child. Talk to your health care professional or pharmacist for more information. Do not breast-feed an infant while taking this medicine or for 1 to 2 weeks after stopping it. Males who get this medicine must use a condom during sex with females who can get pregnant. If you get a woman pregnant, the baby could have birth defects. The baby could die before they are born. You will need to continue wearing a condom for 3 months after stopping the medicine. Tell your health care provider right away if your partner becomes pregnant while you are taking this medicine. This may interfere with the ability to father a child. You should talk to your doctor or health care  professional if you are concerned about your fertility. What side effects may I notice from receiving this medicine? Side effects that you should report to your doctor or health care professional as soon as possible:  allergic reactions like skin rash, itching or hives, swelling of the face, lips, or tongue  blurred vision  breathing problems  changes in vision  low blood counts - This drug may decrease the number of white blood cells, red blood cells and platelets. You may be at increased risk for infections and bleeding.  nausea and vomiting  pain, redness or irritation at site where injected  pain, tingling, numbness in the hands or feet  redness, blistering, peeling, or loosening of the skin, including inside the mouth  signs of decreased platelets or bleeding - bruising, pinpoint red spots on the skin, black, tarry stools, nosebleeds  signs of decreased red blood cells - unusually weak or tired, fainting spells, lightheadedness  signs of infection - fever or chills, cough, sore throat, pain or difficulty passing urine  swelling of the ankle, feet, hands Side effects that usually do not require medical  attention (report to your doctor or health care professional if they continue or are bothersome):  constipation  diarrhea  fingernail or toenail changes  hair loss  loss of appetite  mouth sores  muscle pain This list may not describe all possible side effects. Call your doctor for medical advice about side effects. You may report side effects to FDA at 1-800-FDA-1088. Where should I keep my medicine? This drug is given in a hospital or clinic and will not be stored at home. NOTE: This sheet is a summary. It may not cover all possible information. If you have questions about this medicine, talk to your doctor, pharmacist, or health care provider.  2020 Elsevier/Gold Standard (2018-08-21 12:23:11)  Carboplatin injection What is this medicine? CARBOPLATIN (KAR boe pla tin) is a chemotherapy drug. It targets fast dividing cells, like cancer cells, and causes these cells to die. This medicine is used to treat ovarian cancer and many other cancers. This medicine may be used for other purposes; ask your health care provider or pharmacist if you have questions. COMMON BRAND NAME(S): Paraplatin What should I tell my health care provider before I take this medicine? They need to know if you have any of these conditions:  blood disorders  hearing problems  kidney disease  recent or ongoing radiation therapy  an unusual or allergic reaction to carboplatin, cisplatin, other chemotherapy, other medicines, foods, dyes, or preservatives  pregnant or trying to get pregnant  breast-feeding How should I use this medicine? This drug is usually given as an infusion into a vein. It is administered in a hospital or clinic by a specially trained health care professional. Talk to your pediatrician regarding the use of this medicine in children. Special care may be needed. Overdosage: If you think you have taken too much of this medicine contact a poison control center or emergency room at  once. NOTE: This medicine is only for you. Do not share this medicine with others. What if I miss a dose? It is important not to miss a dose. Call your doctor or health care professional if you are unable to keep an appointment. What may interact with this medicine?  medicines for seizures  medicines to increase blood counts like filgrastim, pegfilgrastim, sargramostim  some antibiotics like amikacin, gentamicin, neomycin, streptomycin, tobramycin  vaccines Talk to your doctor or health care professional before taking any  of these medicines:  acetaminophen  aspirin  ibuprofen  ketoprofen  naproxen This list may not describe all possible interactions. Give your health care provider a list of all the medicines, herbs, non-prescription drugs, or dietary supplements you use. Also tell them if you smoke, drink alcohol, or use illegal drugs. Some items may interact with your medicine. What should I watch for while using this medicine? Your condition will be monitored carefully while you are receiving this medicine. You will need important blood work done while you are taking this medicine. This drug may make you feel generally unwell. This is not uncommon, as chemotherapy can affect healthy cells as well as cancer cells. Report any side effects. Continue your course of treatment even though you feel ill unless your doctor tells you to stop. In some cases, you may be given additional medicines to help with side effects. Follow all directions for their use. Call your doctor or health care professional for advice if you get a fever, chills or sore throat, or other symptoms of a cold or flu. Do not treat yourself. This drug decreases your body's ability to fight infections. Try to avoid being around people who are sick. This medicine may increase your risk to bruise or bleed. Call your doctor or health care professional if you notice any unusual bleeding. Be careful brushing and flossing your  teeth or using a toothpick because you may get an infection or bleed more easily. If you have any dental work done, tell your dentist you are receiving this medicine. Avoid taking products that contain aspirin, acetaminophen, ibuprofen, naproxen, or ketoprofen unless instructed by your doctor. These medicines may hide a fever. Do not become pregnant while taking this medicine. Women should inform their doctor if they wish to become pregnant or think they might be pregnant. There is a potential for serious side effects to an unborn child. Talk to your health care professional or pharmacist for more information. Do not breast-feed an infant while taking this medicine. What side effects may I notice from receiving this medicine? Side effects that you should report to your doctor or health care professional as soon as possible:  allergic reactions like skin rash, itching or hives, swelling of the face, lips, or tongue  signs of infection - fever or chills, cough, sore throat, pain or difficulty passing urine  signs of decreased platelets or bleeding - bruising, pinpoint red spots on the skin, black, tarry stools, nosebleeds  signs of decreased red blood cells - unusually weak or tired, fainting spells, lightheadedness  breathing problems  changes in hearing  changes in vision  chest pain  high blood pressure  low blood counts - This drug may decrease the number of white blood cells, red blood cells and platelets. You may be at increased risk for infections and bleeding.  nausea and vomiting  pain, swelling, redness or irritation at the injection site  pain, tingling, numbness in the hands or feet  problems with balance, talking, walking  trouble passing urine or change in the amount of urine Side effects that usually do not require medical attention (report to your doctor or health care professional if they continue or are bothersome):  hair loss  loss of appetite  metallic taste  in the mouth or changes in taste This list may not describe all possible side effects. Call your doctor for medical advice about side effects. You may report side effects to FDA at 1-800-FDA-1088. Where should I keep my medicine? This  drug is given in a hospital or clinic and will not be stored at home. NOTE: This sheet is a summary. It may not cover all possible information. If you have questions about this medicine, talk to your doctor, pharmacist, or health care provider.  2020 Elsevier/Gold Standard (2007-09-22 14:38:05)

## 2019-01-06 NOTE — Progress Notes (Signed)
Stopped by to see pt in infusion room this am & discussed post anti-nausea meds. Pt expressed understanding & no further questions.

## 2019-01-06 NOTE — Progress Notes (Signed)
[facility]   Telephone:(336) [phone] Fax:(336) [phone]   Clinic Follow up Note   Patient Care Team: Deland Pretty, MD as PCP - General (Internal Medicine) [name], RN as Oncology Nurse Navigator [name], RN as Oncology Nurse Navigator [name], MD as Consulting Physician (General Surgery) [name], MD as Consulting Physician (Hematology) [name], MD as Attending Physician (Radiation Oncology) 01/06/2019  CHIEF COMPLAINT: f/u right breast cancer    SUMMARY OF ONCOLOGIC HISTORY: Oncology History  Malignant neoplasm of upper-outer quadrant of right female breast ([place])  12/07/2018 Mammogram   Mammogram 12/07/18  IMPRESSION: 1. 3 cm mass in the 11 o'clock position of the right breast, 4 cm the nipple, measuring 3 x 2 x 2 cm, highly suspicious for breast carcinoma. No enlarged or abnormal right axillary lymph nodes.   12/09/2018 Cancer Staging   Staging form: Breast, AJCC 8th Edition - Clinical stage from 12/09/2018: Stage IIA (cT2, cN0, cM0, G3, ER-, PR-, HER2+) - Signed by [name], MD on 12/15/2018   12/09/2018 Initial Biopsy   Diagnosis 12/09/18 Breast, right, needle core biopsy, 11 o'clock - INVASIVE DUCTAL CARCINOMA. - DUCTAL CARCINOMA IN SITU. - SEE COMMENT.   12/09/2018 Receptors her2   The tumor cells are POSITIVE for Her2 (3+). Estrogen Receptor: 0%, NEGATIVE Progesterone Receptor: 0%, NEGATIVE Proliferation Marker Ki67: 30%   12/15/2018 Initial Diagnosis   Malignant neoplasm of upper-outer quadrant of right female breast ([place])   12/21/2018 Breast MRI   IMPRESSION: 1. Approximate 4 cm mass involving the UPPER RIGHT breast with extension into the UPPER INNER QUADRANT and UPPER OUTER QUADRANT, predominantly located in the [place], biopsy-proven invasive ductal carcinoma and DCIS. The mass abuts the chest wall though I cannot confirm pectoralis muscle invasion. 2. Superficial satellite mass versus pathologic  intramammary lymph node in the UPPER OUTER QUADRANT of the RIGHT breast measuring approximately 1 cm, located LATERAL to the dominant mass. 3. No MRI evidence of malignancy involving the LEFT breast. 4. No pathologic lymphadenopathy otherwise.   12/24/2018 Pathology Results   Diagnosis Breast, right, needle core biopsy, upper outer quadrant, peripheral to the recently biopsy proven breast carcinoma - INVASIVE DUCTAL CARCINOMA. SEE COMMENT. Results: IMMUNOHISTOCHEMICAL AND MORPHOMETRIC ANALYSIS PERFORMED MANUALLY The tumor cells are POSITIVE for Her2 (3+). Estrogen Receptor: 0%, NEGATIVE Progesterone Receptor: 0%, NEGATIVE Proliferation Marker Ki67: 40%   01/06/2019 -  Chemotherapy   The patient had palonosetron (ALOXI) injection 0.25 mg, 0.25 mg, Intravenous,  Once, 1 of 6 cycles pegfilgrastim-cbqv (UDENYCA) injection 6 mg, 6 mg, Subcutaneous, Once, 1 of 6 cycles CARBOplatin (PARAPLATIN) 390 mg in sodium chloride 0.9 % 100 mL chemo infusion, 390 mg (100 % of original dose 390 mg), Intravenous,  Once, 1 of 6 cycles Dose modification:   (original dose 390 mg, Cycle 1) DOCEtaxel (TAXOTERE) 110 mg in sodium chloride 0.9 % 250 mL chemo infusion, 75 mg/m2 = 110 mg, Intravenous,  Once, 1 of 6 cycles pertuzumab (PERJETA) 840 mg in sodium chloride 0.9 % 250 mL chemo infusion, 840 mg, Intravenous, Once, 1 of 6 cycles fosaprepitant (EMEND) 150 mg, dexamethasone (DECADRON) 12 mg in sodium chloride 0.9 % 145 mL IVPB, , Intravenous,  Once, 1 of 6 cycles trastuzumab-anns (KANJINTI) 357 mg in sodium chloride 0.9 % 250 mL chemo infusion, 8 mg/kg = 357 mg (100 % of original dose 8 mg/kg), Intravenous,  Once, 1 of 6 cycles Dose modification: 8 mg/kg (original dose 8 mg/kg, Cycle 1, Reason: Provider Judgment, Comment: Loading  dose), 6 mg/kg (Cycle 2, Reason: Other (see comments), Comment: Kanjinti preferred per Holland Falling)  for chemotherapy treatment.      CURRENT THERAPY: Neoadjuvant chemotherapy TCHP beginning  01/06/19   INTERVAL HISTORY: Christina Sexton returns for f/u and begin chemotherapy as scheduled. Since last visit she underwent echo, PAC placement, and another right breast biopsy. Insurance denied staging work up. She is healing from procedures with some bruising. She had poison oak exposure and has blisters and rash on her face and arms. She is not itching. She feels well physically. Eating and drinking well. Denies fatigue. She walks 3 miles each day usually. She has dry cough for few years she attributes to allergies. Denies fever, chills, chest pain, dyspnea, edema, neuropathy, or pain.    MEDICAL HISTORY:  Past Medical History:  Diagnosis Date   Allergy    Anemia    Asthma    Cancer ([place])    Breast-R   Chronic kidney disease    Hyperlipidemia    Hypertension    Kidney stone    Mitral valve prolapse    Osteoporosis     SURGICAL HISTORY: Past Surgical History:  Procedure Laterality Date   BREAST BIOPSY Left 12/27/2015   benign   BREAST BIOPSY Right 07/26/2013   benign   BREAST EXCISIONAL BIOPSY Right 09/08/2013   high risk    PORTACATH PLACEMENT N/A 12/28/2018   Procedure: INSERTION PORT-A-CATH;  Surgeon: [name], MD;  Location: [facility];  Service: General;  Laterality: N/A;   SHOULDER SURGERY      I have reviewed the social history and family history with the patient and they are unchanged from previous note.  ALLERGIES:  has No Known Allergies.  MEDICATIONS:  Current Outpatient Medications  Medication Sig Dispense Refill   atorvastatin (LIPITOR) 20 MG tablet Take 20 mg by mouth daily.      dexamethasone (DECADRON) 4 MG tablet Take 2 tablets (8 mg total) by mouth 2 (two) times daily. Start the day before Taxotere. Take once the day after, then 2 times a day x 2d. 30 tablet 1   ondansetron (ZOFRAN) 8 MG tablet Take 1 tablet (8 mg total) by mouth 2 (two) times daily as needed (Nausea or vomiting). Begin 4 days after chemotherapy. 30 tablet 1    oxyCODONE (OXY IR/ROXICODONE) 5 MG immediate release tablet Take 1 tablet (5 mg total) by mouth every 6 (six) hours as needed for severe pain. 8 tablet 0   prochlorperazine (COMPAZINE) 10 MG tablet Take 1 tablet (10 mg total) by mouth every 6 (six) hours as needed (Nausea or vomiting). 30 tablet 1   lidocaine-prilocaine (EMLA) cream Apply to affected area once 30 g 3   Current Facility-Administered Medications  Medication Dose Route Frequency Provider Last Rate Last Dose   0.9 %  sodium chloride infusion  500 mL Intravenous Once Nandigam, Venia Minks, MD       Facility-Administered Medications Ordered in Other Visits  Medication Dose Route Frequency Provider Last Rate Last Dose   CARBOplatin (PARAPLATIN) 390 mg in sodium chloride 0.9 % 250 mL chemo infusion  390 mg Intravenous Once [name], MD       DOCEtaxel (TAXOTERE) 110 mg in sodium chloride 0.9 % 250 mL chemo infusion  75 mg/m2 (Treatment Plan Recorded) Intravenous Once [name], MD       fosaprepitant (EMEND) 150 mg, dexamethasone (DECADRON) 12 mg in sodium chloride 0.9 % 145 mL IVPB   Intravenous Once [name], MD  heparin lock flush 100 unit/mL  500 Units Intracatheter Once PRN [name], MD       palonosetron (ALOXI) injection 0.25 mg  0.25 mg Intravenous Once [name], MD       pertuzumab (PERJETA) 840 mg in sodium chloride 0.9 % 250 mL chemo infusion  840 mg Intravenous Once [name], MD 278 mL/hr at 01/06/19 1218 840 mg at 01/06/19 1218   sodium chloride flush (NS) 0.9 % injection 10 mL  10 mL Intracatheter PRN [name], MD        PHYSICAL EXAMINATION: ECOG PERFORMANCE STATUS: 0 - Asymptomatic  Vitals:   01/06/19 0848  BP: 128/71  Pulse: 78  Resp: 18  Temp: 99.1 F (37.3 C)  SpO2: 98%   Filed Weights   01/06/19 0848  Weight: 102 lb 9.6 oz (46.5 kg)    GENERAL:alert, no distress and comfortable SKIN: no rashes or significant lesions EYES:  sclera clear LUNGS: respirations even and unlabored    HEART:  no lower extremity edema Musculoskeletal:no cyanosis of digits  NEURO: alert & oriented x 3 with fluent speech, normal gait Breast exam s/p right breast biopsy with bruising to upper and outer quadrants. Palpable 3x4 mass in right breast 11 o'clock position with area of thickening peripheral to the mass at site of additional biopsy  PAC site covered by guaze   LABORATORY DATA:  I have reviewed the data as listed CBC Latest Ref Rng & Units 01/06/2019 12/16/2018 06/24/2011  WBC 4.0 - 10.5 K/uL 14.6(H) 5.8 -  Hemoglobin 12.0 - 15.0 g/dL 12.8 14.2 12.6  Hematocrit 36.0 - 46.0 % 39.6 44.2 37.0  Platelets 150 - 400 K/uL 159 167 -     CMP Latest Ref Rng & Units 01/06/2019 12/16/2018 06/24/2011  Glucose 70 - 99 mg/dL 127(H) 127(H) 98  BUN 8 - 23 mg/dL 21 17 4(L)  Creatinine 0.44 - 1.00 mg/dL 0.81 0.94 0.70  Sodium 135 - 145 mmol/L 141 140 141  Potassium 3.5 - 5.1 mmol/L 3.5 3.8 4.9  Chloride 98 - 111 mmol/L 107 103 107  CO2 22 - 32 mmol/L 23 28 -  Calcium 8.9 - 10.3 mg/dL 9.5 9.4 -  Total Protein 6.5 - 8.1 g/dL 7.0 7.4 -  Total Bilirubin 0.3 - 1.2 mg/dL 0.5 0.8 -  Alkaline Phos 38 - 126 U/L 56 57 -  AST 15 - 41 U/L 19 20 -  ALT 0 - 44 U/L 24 20 -      RADIOGRAPHIC STUDIES: I have personally reviewed the radiological images as listed and agreed with the findings in the report. No results found.   ASSESSMENT & PLAN: Christina Sexton is a 68 y.o. caucasian female with a history of asthma as a child, fluctuating BP, osteoporosis   1. 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 Burr Medico previously reviewed her secondary biopsy, which shows satellite lesion in right UOQ that is positive for invasive ductal carcinoma; she has multi-focal disease with similar prognostic features. Treatment plan remains the same. I reviewed with Dr. Burr Medico.  -baseline  echo 60-65%; will refer her for cardiac monitoring. Patient agrees -she attended chemo class and underwent PAC placement; she has pre-med dexa which she took at home and has anti-emetics. We reviewed symptom management and red flags to report  -labs reviewed, WBC 14 likely related to immune response to poison oak and steroid  use. No signs of infection. BG 127, also possibly related to steroids. Will monitor closely.  -physical exam shows palpable 3x4 cm mass in right breast 11 o'clock position and thickening in the area of known satellite lesion, will follow closely with breast exam prior to each cycle. I informed patient if clinically her mass does not get smaller or gets larger, we would assess with interim imaging. She understands.  -proceed with cycle 1 TCHP today, f/u with phone visit next week -she will return for f/u and cycle 2 in 3 weeks   2. Social support -she has friends, no family -she has strong faith in [place]. Our chaplain has reached out to her  PLAN:  -Labs, biopsy reviewed  -Proceed with cycle 1 TCHP -Return on day 3 for Udenyca - we reviewed caritin dosing and possible side effects  -Phone f/u next week -Lab and f/u in 3 weeks with cycle 2 -Refer to cardiology   No problem-specific Assessment & Plan notes found for this encounter.   Orders Placed This Encounter  Procedures   Ambulatory referral to Cardiology    Referral Priority:   Routine    Referral Type:   Consultation    Referral Reason:   Specialty Services Required    Requested Specialty:   Cardiology    Number of Visits Requested:   1   All questions were answered. The patient knows to call the clinic with any problems, questions or concerns. No barriers to learning was detected. I spent 20 minutes counseling the patient face to face. The total time spent in the appointment was 25 minutes and more than 50% was on counseling and review of test results     Alla Feeling, NP 01/06/19

## 2019-01-06 NOTE — Telephone Encounter (Signed)
Scheduled appt per 7/8 los. Left a voice message of appt date and time.

## 2019-01-08 ENCOUNTER — Inpatient Hospital Stay: Payer: Medicare HMO

## 2019-01-08 ENCOUNTER — Other Ambulatory Visit: Payer: Self-pay

## 2019-01-08 VITALS — BP 137/64 | HR 63 | Temp 98.7°F | Resp 18

## 2019-01-08 DIAGNOSIS — G4700 Insomnia, unspecified: Secondary | ICD-10-CM | POA: Diagnosis not present

## 2019-01-08 DIAGNOSIS — Z5111 Encounter for antineoplastic chemotherapy: Secondary | ICD-10-CM | POA: Diagnosis not present

## 2019-01-08 DIAGNOSIS — Z79899 Other long term (current) drug therapy: Secondary | ICD-10-CM | POA: Diagnosis not present

## 2019-01-08 DIAGNOSIS — C50411 Malignant neoplasm of upper-outer quadrant of right female breast: Secondary | ICD-10-CM

## 2019-01-08 DIAGNOSIS — K5900 Constipation, unspecified: Secondary | ICD-10-CM | POA: Diagnosis not present

## 2019-01-08 DIAGNOSIS — Z171 Estrogen receptor negative status [ER-]: Secondary | ICD-10-CM

## 2019-01-08 DIAGNOSIS — R5383 Other fatigue: Secondary | ICD-10-CM | POA: Diagnosis not present

## 2019-01-08 DIAGNOSIS — D72819 Decreased white blood cell count, unspecified: Secondary | ICD-10-CM | POA: Diagnosis not present

## 2019-01-08 DIAGNOSIS — D696 Thrombocytopenia, unspecified: Secondary | ICD-10-CM | POA: Diagnosis not present

## 2019-01-08 MED ORDER — PEGFILGRASTIM-CBQV 6 MG/0.6ML ~~LOC~~ SOSY
6.0000 mg | PREFILLED_SYRINGE | Freq: Once | SUBCUTANEOUS | Status: AC
  Administered 2019-01-08: 6 mg via SUBCUTANEOUS

## 2019-01-08 MED ORDER — PEGFILGRASTIM-CBQV 6 MG/0.6ML ~~LOC~~ SOSY
PREFILLED_SYRINGE | SUBCUTANEOUS | Status: AC
  Filled 2019-01-08: qty 0.6

## 2019-01-08 NOTE — Patient Instructions (Signed)

## 2019-01-11 NOTE — Progress Notes (Signed)
[facility]   Telephone:(336) [phone] Fax:(336) [phone]   Clinic Follow up Note   Patient Care Team: Deland Pretty, MD as PCP - General (Internal Medicine) [name], RN as Oncology Nurse Navigator [name], RN as Oncology Nurse Navigator [name], MD as Consulting Physician (General Surgery) [name], MD as Consulting Physician (Hematology) [name], MD as Attending Physician (Radiation Oncology)  Date of Service:  01/12/2019  CHIEF COMPLAINT: F/u of right breast cancer  SUMMARY OF ONCOLOGIC HISTORY: Oncology History  Malignant neoplasm of upper-outer quadrant of right female breast ([place])  12/07/2018 Mammogram   Mammogram 12/07/18  IMPRESSION: 1. 3 cm mass in the 11 o'clock position of the right breast, 4 cm the nipple, measuring 3 x 2 x 2 cm, highly suspicious for breast carcinoma. No enlarged or abnormal right axillary lymph nodes.   12/09/2018 Cancer Staging   Staging form: Breast, AJCC 8th Edition - Clinical stage from 12/09/2018: Stage IIA (cT2, cN0, cM0, G3, ER-, PR-, HER2+) - Signed by [name], MD on 12/15/2018   12/09/2018 Initial Biopsy   Diagnosis 12/09/18 Breast, right, needle core biopsy, 11 o'clock - INVASIVE DUCTAL CARCINOMA. - DUCTAL CARCINOMA IN SITU. - SEE COMMENT.   12/09/2018 Receptors her2   The tumor cells are POSITIVE for Her2 (3+). Estrogen Receptor: 0%, NEGATIVE Progesterone Receptor: 0%, NEGATIVE Proliferation Marker Ki67: 30%   12/15/2018 Initial Diagnosis   Malignant neoplasm of upper-outer quadrant of right female breast ([place])   12/21/2018 Breast MRI   IMPRESSION: 1. Approximate 4 cm mass involving the UPPER RIGHT breast with extension into the UPPER INNER QUADRANT and UPPER OUTER QUADRANT, predominantly located in the [place], biopsy-proven invasive ductal carcinoma and DCIS. The mass abuts the chest wall though I cannot confirm pectoralis muscle invasion. 2. Superficial satellite mass versus  pathologic intramammary lymph node in the UPPER OUTER QUADRANT of the RIGHT breast measuring approximately 1 cm, located LATERAL to the dominant mass. 3. No MRI evidence of malignancy involving the LEFT breast. 4. No pathologic lymphadenopathy otherwise.   12/24/2018 Pathology Results   Diagnosis Breast, right, needle core biopsy, upper outer quadrant, peripheral to the recently biopsy proven breast carcinoma - INVASIVE DUCTAL CARCINOMA. SEE COMMENT. Results: IMMUNOHISTOCHEMICAL AND MORPHOMETRIC ANALYSIS PERFORMED MANUALLY The tumor cells are POSITIVE for Her2 (3+). Estrogen Receptor: 0%, NEGATIVE Progesterone Receptor: 0%, NEGATIVE Proliferation Marker Ki67: 40%   01/06/2019 -  Chemotherapy   Neoadjuvant chemotherapy TCHP beginning 01/06/19       CURRENT THERAPY:  Neoadjuvant chemotherapy TCHP beginning 01/06/19   INTERVAL HISTORY:  Christina Sexton is here for a follow up of treatment. She presents to the clinic alone. She notes its has been a week from her infusion and she does not feel well. She feels acid reflux which makes it hard for her to eat. She did take dexa around the day of infusion. She is hesitant about more medication She also notes constipation. She has tried OTC stool softener. Her last BM was yesterday with stool softener. She notes her sleeping has gotten worse and she is having trouble sleeping. She notes benadryl does not work for her. She notes given GI issues, she is concerned about what to eat, she is interested in seeing dietician. She drinks water less lately due to taste change She also notes skin rash on her face which is mild.     REVIEW OF SYSTEMS:   Constitutional: Denies fevers, chills (+) trouble sleeping (+) lower appetite, mild weight  loss (+) taste change Eyes: Denies blurriness of vision Ears, nose, mouth, throat, and face: Denies mucositis or sore throat Respiratory: Denies cough, dyspnea or wheezes Cardiovascular: Denies palpitation, chest  discomfort or lower extremity swelling Gastrointestinal:  Denies nausea, heartburn (+) acid reflux (+) constipation Skin: (+) Mild skin rash on face.  Lymphatics: Denies new lymphadenopathy or easy bruising Neurological:Denies numbness, tingling or new weaknesses Behavioral/Psych: Mood is stable, no new changes  All other systems were reviewed with the patient and are negative.  MEDICAL HISTORY:  Past Medical History:  Diagnosis Date  . Allergy   . Anemia   . Asthma   . Cancer ([place])    Breast-R  . Chronic kidney disease   . Hyperlipidemia   . Hypertension   . Kidney stone   . Mitral valve prolapse   . Osteoporosis     SURGICAL HISTORY: Past Surgical History:  Procedure Laterality Date  . BREAST BIOPSY Left 12/27/2015   benign  . BREAST BIOPSY Right 07/26/2013   benign  . BREAST EXCISIONAL BIOPSY Right 09/08/2013   high risk   . PORTACATH PLACEMENT N/A 12/28/2018   Procedure: INSERTION PORT-A-CATH;  Surgeon: [name], MD;  Location: [facility];  Service: General;  Laterality: N/A;  . SHOULDER SURGERY      I have reviewed the social history and family history with the patient and they are unchanged from previous note.  ALLERGIES:  has No Known Allergies.  MEDICATIONS:  Current Outpatient Medications  Medication Sig Dispense Refill  . atorvastatin (LIPITOR) 20 MG tablet Take 20 mg by mouth daily.     Marland Kitchen dexamethasone (DECADRON) 4 MG tablet Take 2 tablets (8 mg total) by mouth 2 (two) times daily. Start the day before Taxotere. Take once the day after, then 2 times a day x 2d. 30 tablet 1  . lidocaine-prilocaine (EMLA) cream Apply to affected area once 30 g 3  . ondansetron (ZOFRAN) 8 MG tablet Take 1 tablet (8 mg total) by mouth 2 (two) times daily as needed (Nausea or vomiting). Begin 4 days after chemotherapy. 30 tablet 1  . oxyCODONE (OXY IR/ROXICODONE) 5 MG immediate release tablet Take 1 tablet (5 mg total) by mouth every 6 (six) hours as needed for severe pain.  (Patient not taking: Reported on 01/12/2019) 8 tablet 0  . pantoprazole (PROTONIX) 20 MG tablet Take 1 tablet (20 mg total) by mouth daily. 30 tablet 1  . prochlorperazine (COMPAZINE) 10 MG tablet Take 1 tablet (10 mg total) by mouth every 6 (six) hours as needed (Nausea or vomiting). (Patient not taking: Reported on 01/12/2019) 30 tablet 1  . traZODone (DESYREL) 50 MG tablet Take 1 tablet (50 mg total) by mouth at bedtime. 20 tablet 0   Current Facility-Administered Medications  Medication Dose Route Frequency Provider Last Rate Last Dose  . 0.9 %  sodium chloride infusion  500 mL Intravenous Once Nandigam, Kavitha V, MD        PHYSICAL EXAMINATION: ECOG PERFORMANCE STATUS: 2 - Symptomatic, <50% confined to bed  Vitals:   01/12/19 0845  BP: 123/69  Pulse: (!) 111  Resp: 18  Temp: 98.7 F (37.1 C)  SpO2: 98%   Filed Weights   01/12/19 0845  Weight: 100 lb 1.6 oz (45.4 kg)    GENERAL:alert, no distress and comfortable SKIN: skin color, texture, turgor are normal, no rashes or significant lesions except mild skin rash on face  EYES: normal, Conjunctiva are pink and non-injected, sclera clear  NECK: supple, thyroid  normal size, non-tender, without nodularity LYMPH:  no palpable lymphadenopathy in the cervical, axillary  LUNGS: clear to auscultation and percussion with normal breathing effort HEART: regular rate & rhythm and no murmurs and no lower extremity edema ABDOMEN:abdomen soft, non-tender and normal bowel sounds Musculoskeletal:no cyanosis of digits and no clubbing  NEURO: alert & oriented x 3 with fluent speech, no focal motor/sensory deficits  LABORATORY DATA:  I have reviewed the data as listed CBC Latest Ref Rng & Units 01/12/2019 01/06/2019 12/16/2018  WBC 4.0 - 10.5 K/uL 1.2(L) 14.6(H) 5.8  Hemoglobin 12.0 - 15.0 g/dL 13.5 12.8 14.2  Hematocrit 36.0 - 46.0 % 41.1 39.6 44.2  Platelets 150 - 400 K/uL 90(L) 159 167     CMP Latest Ref Rng & Units 01/06/2019 12/16/2018  06/24/2011  Glucose 70 - 99 mg/dL 127(H) 127(H) 98  BUN 8 - 23 mg/dL 21 17 4(L)  Creatinine 0.44 - 1.00 mg/dL 0.81 0.94 0.70  Sodium 135 - 145 mmol/L 141 140 141  Potassium 3.5 - 5.1 mmol/L 3.5 3.8 4.9  Chloride 98 - 111 mmol/L 107 103 107  CO2 22 - 32 mmol/L 23 28 -  Calcium 8.9 - 10.3 mg/dL 9.5 9.4 -  Total Protein 6.5 - 8.1 g/dL 7.0 7.4 -  Total Bilirubin 0.3 - 1.2 mg/dL 0.5 0.8 -  Alkaline Phos 38 - 126 U/L 56 57 -  AST 15 - 41 U/L 19 20 -  ALT 0 - 44 U/L 24 20 -      RADIOGRAPHIC STUDIES: I have personally reviewed the radiological images as listed and agreed with the findings in the report. No results found.   ASSESSMENT & PLAN:  Christina Sexton is a 68 y.o. female with   1. 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 She has invasive ductal carcinoma and DCIS.  -She understands surgery is the only curative option.  -Breast MRI shows multifocal disease of right breast. Her 12/24/18 secondary biopsy shows satellite lesion in right UOQ that is positive for invasive ductal carcinoma; with similar prognostic features.  -Given this is high risk cancer, systemic chemotherapy is recommended to reduce her risk of distant cancer recurrence. In order to downstage her cancer and make her a lumpectomy candidate, I started her on neoadjuvant TCHP q3weeks for 6 cycles on 01/06/19 followed by maintenance H/P to complete 1 year of treatment. During which she plans to proceed with surgery followed by RT with Dr. Isidore Moos.  -She has baseline ECHO was 60-65% and PAC was placed.  -She tolerated first cycle TCHP poorly during week 1 with insomnia, constipation, poor appetite and 3lbs weight loss, skin rash and Acid reflux.   -Labs reviewed, CBC WNL except WBC 1.2, PLT 90K, ANC0.3, CMP pending. I will given IV Fluids today given she is likely dehydrated.  -I strongly encouraged her to drink plenty of more water  and liquids.  -I discussed given her leukopenia and thrombocytopenia she is at risk of infection and bleeding. She received GCSF last lweek. I reviewed COVID-19 precautions and advised her to avoid fall and injury. I advised her to watch for fever.  -I discussed starting week 2-3 her symptoms should start to improve. If any of her symptoms significantly worsen she should contact clinic.  -F/u in 2 weeks with cycle 2.    2. Acid Reflux, Constipation, Insomnia, low appetite, supportive care  -Secondary to chemo -For acid reflux I called  in Protonix for her to use. We reviewed diet  -For constipation she has been taking low dose stool softener which allowed her to have BM yesterday. I instructed her to continue or use OTC Miralax daily if not having a BM or hard stool.  -For her Insomnia I recommend she try OTC Melatonin first. If not helping, I called in Trazodone today (01/12/19)  -Given GI issues she has been afraid to eat which lead to mild weight loss. I will set up dietician consult to help her determine what to eat.  -For her mild skin rash I advised her to stay out of direct sunlight and to cover her skin.  -I discussed she can still develop nausea and diarrhea as chemo continues. She has Zofran and Compazine to use before meal if needed.   3. Social support  -She is single, lives alone and has no children. She lives 1-2 miles from cancer center.  -She notes she has friends but overall not much family support.  -she is so far able to do all self-care     PLAN: -I called in Protonix and Trazodone  -Proceed with IV fluids today, no antiemetics  -Lab, flush, f/u and TCHP in 2 weeks.  -send dietician consult        No problem-specific Assessment & Plan notes found for this encounter.   No orders of the defined types were placed in this encounter.  All questions were answered. The patient knows to call the clinic with any problems, questions or concerns. No barriers to  learning was detected. I spent 15 minutes counseling the patient face to face. The total time spent in the appointment was 20 minutes and more than 50% was on counseling and review of test results     [name], MD 01/12/2019   I, Joslyn Devon, am acting as scribe for [name], MD.   I have reviewed the above documentation for accuracy and completeness, and I agree with the above.

## 2019-01-12 ENCOUNTER — Inpatient Hospital Stay (HOSPITAL_BASED_OUTPATIENT_CLINIC_OR_DEPARTMENT_OTHER): Payer: Medicare HMO | Admitting: Hematology

## 2019-01-12 ENCOUNTER — Inpatient Hospital Stay: Payer: Medicare HMO

## 2019-01-12 ENCOUNTER — Ambulatory Visit: Payer: Medicare HMO | Admitting: Nurse Practitioner

## 2019-01-12 ENCOUNTER — Other Ambulatory Visit: Payer: Self-pay | Admitting: Hematology

## 2019-01-12 ENCOUNTER — Encounter: Payer: Self-pay | Admitting: Hematology

## 2019-01-12 ENCOUNTER — Other Ambulatory Visit: Payer: Self-pay

## 2019-01-12 ENCOUNTER — Ambulatory Visit: Payer: Medicare HMO

## 2019-01-12 VITALS — BP 123/69 | HR 111 | Temp 98.7°F | Resp 18 | Ht 63.0 in | Wt 100.1 lb

## 2019-01-12 DIAGNOSIS — D72819 Decreased white blood cell count, unspecified: Secondary | ICD-10-CM | POA: Diagnosis not present

## 2019-01-12 DIAGNOSIS — Z5111 Encounter for antineoplastic chemotherapy: Secondary | ICD-10-CM | POA: Diagnosis not present

## 2019-01-12 DIAGNOSIS — G4700 Insomnia, unspecified: Secondary | ICD-10-CM | POA: Diagnosis not present

## 2019-01-12 DIAGNOSIS — Z95828 Presence of other vascular implants and grafts: Secondary | ICD-10-CM

## 2019-01-12 DIAGNOSIS — D696 Thrombocytopenia, unspecified: Secondary | ICD-10-CM | POA: Diagnosis not present

## 2019-01-12 DIAGNOSIS — C50411 Malignant neoplasm of upper-outer quadrant of right female breast: Secondary | ICD-10-CM

## 2019-01-12 DIAGNOSIS — Z171 Estrogen receptor negative status [ER-]: Secondary | ICD-10-CM

## 2019-01-12 DIAGNOSIS — K5900 Constipation, unspecified: Secondary | ICD-10-CM

## 2019-01-12 DIAGNOSIS — Z79899 Other long term (current) drug therapy: Secondary | ICD-10-CM

## 2019-01-12 DIAGNOSIS — R5383 Other fatigue: Secondary | ICD-10-CM | POA: Diagnosis not present

## 2019-01-12 LAB — CMP (CANCER CENTER ONLY)
ALT: 24 U/L (ref 0–44)
AST: 15 U/L (ref 15–41)
Albumin: 3.7 g/dL (ref 3.5–5.0)
Alkaline Phosphatase: 56 U/L (ref 38–126)
Anion gap: 10 (ref 5–15)
BUN: 19 mg/dL (ref 8–23)
CO2: 26 mmol/L (ref 22–32)
Calcium: 8.5 mg/dL — ABNORMAL LOW (ref 8.9–10.3)
Chloride: 102 mmol/L (ref 98–111)
Creatinine: 0.75 mg/dL (ref 0.44–1.00)
GFR, Est AFR Am: >60 mL/min (ref >60)
GFR, Estimated: >60 mL/min (ref >60)
Glucose, Bld: 103 mg/dL — ABNORMAL HIGH (ref 70–99)
Potassium: 3.1 mmol/L — ABNORMAL LOW (ref 3.5–5.1)
Sodium: 138 mmol/L (ref 135–145)
Total Bilirubin: 1.1 mg/dL (ref 0.3–1.2)
Total Protein: 6.5 g/dL (ref 6.5–8.1)

## 2019-01-12 LAB — CBC WITH DIFFERENTIAL (CANCER CENTER ONLY)
Abs Immature Granulocytes: 0.1 10*3/uL — ABNORMAL HIGH (ref 0.00–0.07)
Basophils Absolute: 0 10*3/uL (ref 0.0–0.1)
Basophils Relative: 1 %
Eosinophils Absolute: 0 10*3/uL (ref 0.0–0.5)
Eosinophils Relative: 3 %
HCT: 41.1 % (ref 36.0–46.0)
Hemoglobin: 13.5 g/dL (ref 12.0–15.0)
Immature Granulocytes: 9 %
Lymphocytes Relative: 55 %
Lymphs Abs: 0.7 10*3/uL (ref 0.7–4.0)
MCH: 29.8 pg (ref 26.0–34.0)
MCHC: 32.8 g/dL (ref 30.0–36.0)
MCV: 90.7 fL (ref 80.0–100.0)
Monocytes Absolute: 0.1 10*3/uL (ref 0.1–1.0)
Monocytes Relative: 9 %
Neutro Abs: 0.3 10*3/uL — CL (ref 1.7–7.7)
Neutrophils Relative %: 23 %
Platelet Count: 90 10*3/uL — ABNORMAL LOW (ref 150–400)
RBC: 4.53 MIL/uL (ref 3.87–5.11)
RDW: 12.9 % (ref 11.5–15.5)
WBC Count: 1.2 10*3/uL — ABNORMAL LOW (ref 4.0–10.5)
nRBC: 0 % (ref 0.0–0.2)

## 2019-01-12 MED ORDER — TRAZODONE HCL 50 MG PO TABS: 50.0000 mg | ORAL_TABLET | Freq: Every day | ORAL | 0 refills | Status: DC

## 2019-01-12 MED ORDER — SODIUM CHLORIDE 0.9% FLUSH
10.0000 mL | Freq: Once | INTRAVENOUS | Status: AC | PRN
  Administered 2019-01-12: 10 mL
  Filled 2019-01-12: qty 10

## 2019-01-12 MED ORDER — POTASSIUM CHLORIDE CRYS ER 20 MEQ PO TBCR: 20.0000 meq | EXTENDED_RELEASE_TABLET | Freq: Every day | ORAL | 0 refills | Status: DC

## 2019-01-12 MED ORDER — PANTOPRAZOLE SODIUM 20 MG PO TBEC: 20.0000 mg | DELAYED_RELEASE_TABLET | Freq: Every day | ORAL | 1 refills | Status: DC

## 2019-01-12 MED ORDER — HEPARIN SOD (PORK) LOCK FLUSH 100 UNIT/ML IV SOLN
500.0000 [IU] | Freq: Once | INTRAVENOUS | Status: AC | PRN
  Administered 2019-01-12: 500 [IU]
  Filled 2019-01-12: qty 5

## 2019-01-12 MED ORDER — SODIUM CHLORIDE 0.9 % IV SOLN
Freq: Once | INTRAVENOUS | Status: AC
  Administered 2019-01-12: 10:00:00 via INTRAVENOUS
  Filled 2019-01-12: qty 250

## 2019-01-12 MED ORDER — SODIUM CHLORIDE 0.9% FLUSH
10.0000 mL | Freq: Once | INTRAVENOUS | Status: AC
  Administered 2019-01-12: 10 mL
  Filled 2019-01-12: qty 10

## 2019-01-12 NOTE — Patient Instructions (Signed)
Please [facility] up your [facility] pills from [facility] and take one pill each day.    Hypokalemia Hypokalemia means that the amount of potassium in the blood is lower than normal. Potassium is a chemical (electrolyte) that helps regulate the amount of fluid in the body. It also stimulates muscle tightening (contraction) and helps nerves work properly. Normally, most of the body's potassium is inside cells, and only a very small amount is in the blood. Because the amount in the blood is so small, minor changes to potassium levels in the blood can be life-threatening. What are the causes? This condition may be caused by:  Antibiotic medicine.  Diarrhea or vomiting. Taking too much of a medicine that helps you have a bowel movement (laxative) can cause diarrhea and lead to hypokalemia.  Chronic kidney disease (CKD).  Medicines that help the body get rid of excess fluid (diuretics).  Eating disorders, such as bulimia.  Low magnesium levels in the body.  Sweating a lot. What are the signs or symptoms? Symptoms of this condition include:  Weakness.  Constipation.  Fatigue.  Muscle cramps.  Mental confusion.  Skipped heartbeats or irregular heartbeat (palpitations).  Tingling or numbness. How is this diagnosed? This condition is diagnosed with a blood test. How is this treated? This condition may be treated by:  Taking potassium supplements by mouth.  Adjusting the medicines that you take.  Eating more foods that contain a lot of potassium. If your potassium level is very low, you may need to get potassium through an IV and be monitored in the hospital. Follow these instructions at home:   Take over-the-counter and prescription medicines only as told by your health care provider. This includes vitamins and supplements.  Eat a healthy diet. A healthy diet includes fresh fruits and vegetables, whole grains, healthy fats, and lean proteins.  If instructed, eat more foods that  contain a lot of potassium. This includes: ? Nuts, such as peanuts and pistachios. ? Seeds, such as sunflower seeds and pumpkin seeds. ? Peas, lentils, and lima beans. ? Whole grain and bran cereals and breads. ? Fresh fruits and vegetables, such as apricots, avocado, bananas, cantaloupe, kiwi, oranges, tomatoes, asparagus, and potatoes. ? Orange juice. ? Tomato juice. ? Red meats. ? Yogurt.  Keep all follow-up visits as told by your health care provider. This is important. Contact a health care provider if you:  Have weakness that gets worse.  Feel your heart pounding or racing.  Vomit.  Have diarrhea.  Have diabetes (diabetes mellitus) and you have trouble keeping your blood sugar (glucose) in your target range. Get help right away if you:  Have chest pain.  Have shortness of breath.  Have vomiting or diarrhea that lasts for more than 2 days.  Faint. Summary  Hypokalemia means that the amount of potassium in the blood is lower than normal.  This condition is diagnosed with a blood test.  Hypokalemia may be treated by taking potassium supplements, adjusting the medicines that you take, or eating more foods that are high in potassium.  If your potassium level is very low, you may need to get potassium through an IV and be monitored in the hospital. This information is not intended to replace advice given to you by your health care provider. Make sure you discuss any questions you have with your health care provider. Document Released: 06/17/2005 Document Revised: 01/28/2018 Document Reviewed: 01/28/2018 Elsevier Patient Education  2020 [place].   Dehydration, Adult  Dehydration is when  there is not enough fluid or water in your body. This happens when you lose more fluids than you take in. Dehydration can range from mild to very bad. It should be treated right away to keep it from getting very bad. Symptoms of mild dehydration may include:  Thirst.  Dry  lips.  Slightly dry mouth.  Dry, warm skin.  Dizziness. Symptoms of moderate dehydration may include:  Very dry mouth.  Muscle cramps.  Dark pee (urine). Pee may be the color of tea.  Your body making less pee.  Your eyes making fewer tears.  Heartbeat that is uneven or faster than normal (palpitations).  Headache.  Light-headedness, especially when you stand up from sitting.  Fainting (syncope). Symptoms of very bad dehydration may include:  Changes in skin, such as: ? Cold and clammy skin. ? Blotchy (mottled) or pale skin. ? Skin that does not quickly return to normal after being lightly pinched and let go (poor skin turgor).  Changes in body fluids, such as: ? Feeling very thirsty. ? Your eyes making fewer tears. ? Not sweating when body temperature is high, such as in hot weather. ? Your body making very little pee.  Changes in vital signs, such as: ? Weak pulse. ? Pulse that is more than 100 beats a minute when you are sitting still. ? Fast breathing. ? Low blood pressure.  Other changes, such as: ? Sunken eyes. ? Cold hands and feet. ? Confusion. ? Lack of energy (lethargy). ? Trouble waking up from sleep. ? Short-term weight loss. ? Unconsciousness. Follow these instructions at home:   If told by your doctor, drink an ORS: ? Make an ORS by using instructions on the package. ? Start by drinking small amounts, about  cup (120 mL) every 5-10 minutes. ? Slowly drink more until you have had the amount that your doctor said to have.  Drink enough clear fluid to keep your pee clear or pale yellow. If you were told to drink an ORS, finish the ORS first, then start slowly drinking clear fluids. Drink fluids such as: ? Water. Do not drink only water by itself. Doing that can make the salt (sodium) level in your body get too low (hyponatremia). ? Ice chips. ? Fruit juice that you have added water to (diluted). ? Low-calorie sports drinks.  Avoid: ?  Alcohol. ? Drinks that have a lot of sugar. These include high-calorie sports drinks, fruit juice that does not have water added, and soda. ? Caffeine. ? Foods that are greasy or have a lot of fat or sugar.  Take over-the-counter and prescription medicines only as told by your doctor.  Do not take salt tablets. Doing that can make the salt level in your body get too high (hypernatremia).  Eat foods that have minerals (electrolytes). Examples include bananas, oranges, potatoes, tomatoes, and spinach.  Keep all follow-up visits as told by your doctor. This is important. Contact a doctor if:  You have belly (abdominal) pain that: ? Gets worse. ? Stays in one area (localizes).  You have a rash.  You have a stiff neck.  You get angry or annoyed more easily than normal (irritability).  You are more sleepy than normal.  You have a harder time waking up than normal.  You feel: ? Weak. ? Dizzy. ? Very thirsty.  You have peed (urinated) only a small amount of very dark pee during 6-8 hours. Get help right away if:  You have symptoms of very bad dehydration.  You cannot drink fluids without throwing up (vomiting).  Your symptoms get worse with treatment.  You have a fever.  You have a very bad headache.  You are throwing up or having watery poop (diarrhea) and it: ? Gets worse. ? Does not go away.  You have blood or something green (bile) in your throw-up.  You have blood in your poop (stool). This may cause poop to look black and tarry.  You have not peed in 6-8 hours.  You pass out (faint).  Your heart rate when you are sitting still is more than 100 beats a minute.  You have trouble breathing. This information is not intended to replace advice given to you by your health care provider. Make sure you discuss any questions you have with your health care provider. Document Released: 04/13/2009 Document Revised: 05/30/2017 Document Reviewed: 08/11/2015 Elsevier  Patient Education  2020 [employer].

## 2019-01-12 NOTE — Patient Instructions (Signed)

## 2019-01-12 NOTE — Progress Notes (Unsigned)
[facility]

## 2019-01-12 NOTE — Progress Notes (Signed)
Nutrition Follow-up:  Patient with breast cancer receiving chemotherapy.    Met with patient during infusion today.  Patient reports that her appetite is decreased due to reflux, constipation and taste change.  Patient eating sandwich during visit.  Concerned about taking so much medication.  Reports that she has tried chocolate boost and is able to tolerate it.     Medications: dexamethasone, zofran, protonix, compazine, miralax  Labs: K 3.1, glucose 103  Anthropometrics:   Weight 100 lb today slight decrease from 101 lb on 6/24   NUTRITION DIAGNOSIS: Inadequate oral intake related to cancer and cancer related treatment side effects as evidenced by poor appetite, constipation   INTERVENTION:  Encouraged using medication to help with constipation.  Discussed the importance of adequate fluids and fiber intake. Encouraged taking protonix to help with reflux. Discussed some foods to pay attention to which could increase risk of reflux Discussed small frequent meals. Discussed oral nutrition supplements and samples taken to patient.       MONITORING, EVALUATION, GOAL: Patient will consume adequate calories to prevent further weight loss   NEXT VISIT: July 29 during infusion   Meral Geissinger B. Zenia Resides, [place], [place] Registered Dietitian [phone] (pager)

## 2019-01-13 ENCOUNTER — Telehealth: Payer: Self-pay | Admitting: Hematology

## 2019-01-13 NOTE — Telephone Encounter (Signed)
No los per 7/14.

## 2019-01-14 ENCOUNTER — Ambulatory Visit: Payer: Medicare HMO | Admitting: Nurse Practitioner

## 2019-01-18 ENCOUNTER — Telehealth: Payer: Self-pay

## 2019-01-18 ENCOUNTER — Other Ambulatory Visit: Payer: Self-pay

## 2019-01-18 DIAGNOSIS — R197 Diarrhea, unspecified: Secondary | ICD-10-CM

## 2019-01-18 DIAGNOSIS — E876 Hypokalemia: Secondary | ICD-10-CM

## 2019-01-18 MED ORDER — POTASSIUM CHLORIDE CRYS ER 20 MEQ PO TBCR: 20.0000 meq | EXTENDED_RELEASE_TABLET | Freq: Every day | ORAL | 1 refills | Status: DC

## 2019-01-18 NOTE — Telephone Encounter (Signed)
Received notification from [facility] with approval for EMLA cream, sent to HIM for scanning to chart.

## 2019-01-18 NOTE — Telephone Encounter (Signed)
Patient calls stating she is having diarrhea and wants advice on what to do.  She is able to eat and drink.  She has not tried Imodium as of yet.  Instructed her to get OTC Imodium, take 2 tablets right away and then one tablet with every episode of diarrhea thereafter.  She verbalized an understanding and will call back if this does not help.  Also she will take Potassium every day she is having diarrhea.  [facility]

## 2019-01-26 NOTE — Progress Notes (Signed)
[place]   Telephone:([phone] Fax:([phone]) [phone]   Clinic Follow up Note   Patient Care Team: Deland Pretty, MD as PCP - General (Internal Medicine) [name], RN as Oncology Nurse Navigator [name], RN as Oncology Nurse Navigator [name], MD as Consulting Physician (General Surgery) [name], MD as Consulting Physician (Hematology) [name], MD as Attending Physician (Radiation Oncology) 01/27/2019  CHIEF COMPLAINT: Follow-up right breast cancer  SUMMARY OF ONCOLOGIC HISTORY: Oncology History  Malignant neoplasm of upper-outer quadrant of right female breast ([place])  12/07/2018 Mammogram   Mammogram 12/07/18  IMPRESSION: 1. 3 cm mass in the 11 o'clock position of the right breast, 4 cm the nipple, measuring 3 x 2 x 2 cm, highly suspicious for breast carcinoma. No enlarged or abnormal right axillary lymph nodes.   12/09/2018 Cancer Staging   Staging form: Breast, AJCC 8th Edition - Clinical stage from 12/09/2018: Stage IIA (cT2, cN0, cM0, G3, ER-, PR-, HER2+) - Signed by [name], MD on 12/15/2018   12/09/2018 Initial Biopsy   Diagnosis 12/09/18 Breast, right, needle core biopsy, 11 o'clock - INVASIVE DUCTAL CARCINOMA. - DUCTAL CARCINOMA IN SITU. - SEE COMMENT.   12/09/2018 Receptors her2   The tumor cells are POSITIVE for Her2 (3+). Estrogen Receptor: 0%, NEGATIVE Progesterone Receptor: 0%, NEGATIVE Proliferation Marker Ki67: 30%   12/15/2018 Initial Diagnosis   Malignant neoplasm of upper-outer quadrant of right female breast ([facility])   12/21/2018 Breast MRI   IMPRESSION: 1. Approximate 4 cm mass involving the UPPER RIGHT breast with extension into the UPPER INNER QUADRANT and UPPER OUTER QUADRANT, predominantly located in the [place], biopsy-proven invasive ductal carcinoma and DCIS. The mass abuts the chest wall though I cannot confirm pectoralis muscle invasion. 2. Superficial satellite mass versus pathologic  intramammary lymph node in the UPPER OUTER QUADRANT of the RIGHT breast measuring approximately 1 cm, located LATERAL to the dominant mass. 3. No MRI evidence of malignancy involving the LEFT breast. 4. No pathologic lymphadenopathy otherwise.   12/24/2018 Pathology Results   Diagnosis Breast, right, needle core biopsy, upper outer quadrant, peripheral to the recently biopsy proven breast carcinoma - INVASIVE DUCTAL CARCINOMA. SEE COMMENT. Results: IMMUNOHISTOCHEMICAL AND MORPHOMETRIC ANALYSIS PERFORMED MANUALLY The tumor cells are POSITIVE for Her2 (3+). Estrogen Receptor: 0%, NEGATIVE Progesterone Receptor: 0%, NEGATIVE Proliferation Marker Ki67: 40%   01/06/2019 -  Chemotherapy   Neoadjuvant chemotherapy TCHP beginning 01/06/19      CURRENT THERAPY: Neoadjuvant chemotherapy TCHP beginning 01/06/19  INTERVAL HISTORY: Ms. [name] returns for follow-up and treatment as scheduled.  She completed cycle 1 on 01/06/2019.  She was seen a week later by Dr. Burr Medico on 7/14, required supportive care with IV fluids. She feels well today, but had a tough time with treatment. Had diarrhea after meals for 1.5 weeks, finally resolved with imodium. Now has soft BM once per day. Protonix helped her reflux. Denies n/v. Denies bleeding. Denies dysuria. She has decreased taste. Denies mucositis. Denies lacrimation. She is fatigued, moderate at times. She walks 15-20 mins daily. She has dry intermittent cough ongoing for 2 years, could be sinus related. Denies chest pain, dyspnea. Denies fever, chills. Denies neuropathy. Her right breast mass is smaller.    MEDICAL HISTORY:  Past Medical History:  Diagnosis Date  . Allergy   . Anemia   . Asthma   . Cancer ([facility])    Breast-R  . Chronic kidney disease   . Hyperlipidemia   . Hypertension   .  Kidney stone   . Mitral valve prolapse   . Osteoporosis     SURGICAL HISTORY: Past Surgical History:  Procedure Laterality Date  . BREAST BIOPSY Left 12/27/2015    benign  . BREAST BIOPSY Right 07/26/2013   benign  . BREAST EXCISIONAL BIOPSY Right 09/08/2013   high risk   . PORTACATH PLACEMENT N/A 12/28/2018   Procedure: INSERTION PORT-A-CATH;  Surgeon: [name], MD;  Location: [facility];  Service: General;  Laterality: N/A;  . SHOULDER SURGERY      I have reviewed the social history and family history with the patient and they are unchanged from previous note.  ALLERGIES:  has No Known Allergies.  MEDICATIONS:  Current Outpatient Medications  Medication Sig Dispense Refill  . atorvastatin (LIPITOR) 20 MG tablet Take 20 mg by mouth daily.     Marland Kitchen dexamethasone (DECADRON) 4 MG tablet Take 2 tablets (8 mg total) by mouth 2 (two) times daily. Start the day before Taxotere. Take once the day after, then 2 times a day x 2d. 30 tablet 1  . lidocaine-prilocaine (EMLA) cream Apply to affected area once 30 g 3  . ondansetron (ZOFRAN) 8 MG tablet Take 1 tablet (8 mg total) by mouth 2 (two) times daily as needed (Nausea or vomiting). Begin 4 days after chemotherapy. 30 tablet 1  . oxyCODONE (OXY IR/ROXICODONE) 5 MG immediate release tablet Take 1 tablet (5 mg total) by mouth every 6 (six) hours as needed for severe pain. 8 tablet 0  . pantoprazole (PROTONIX) 20 MG tablet Take 1 tablet (20 mg total) by mouth daily. 30 tablet 1  . potassium chloride SA (K-DUR) 20 MEQ tablet Take 1 tablet (20 mEq total) by mouth daily. 30 tablet 1  . prochlorperazine (COMPAZINE) 10 MG tablet Take 1 tablet (10 mg total) by mouth every 6 (six) hours as needed (Nausea or vomiting). 30 tablet 1  . traZODone (DESYREL) 50 MG tablet Take 1 tablet (50 mg total) by mouth at bedtime. 30 tablet 1   Current Facility-Administered Medications  Medication Dose Route Frequency Provider Last Rate Last Dose  . 0.9 %  sodium chloride infusion  500 mL Intravenous Once Nandigam, Venia Minks, MD       Facility-Administered Medications Ordered in Other Visits  Medication Dose Route Frequency Provider  Last Rate Last Dose  . CARBOplatin (PARAPLATIN) 390 mg in sodium chloride 0.9 % 250 mL chemo infusion  390 mg Intravenous Once [name], MD      . DOCEtaxel (TAXOTERE) 110 mg in sodium chloride 0.9 % 250 mL chemo infusion  75 mg/m2 (Treatment Plan Recorded) Intravenous Once [name], MD      . fosaprepitant (EMEND) 150 mg, dexamethasone (DECADRON) 12 mg in sodium chloride 0.9 % 145 mL IVPB   Intravenous Once [name], MD      . heparin lock flush 100 unit/mL  500 Units Intracatheter Once PRN [name], MD      . pertuzumab (PERJETA) 420 mg in sodium chloride 0.9 % 250 mL chemo infusion  420 mg Intravenous Once [name], MD      . sodium chloride flush (NS) 0.9 % injection 10 mL  10 mL Intracatheter PRN [name], MD      . Theotis [facility]) 273 mg in sodium chloride 0.9 % 250 mL chemo infusion  6 mg/kg (Treatment Plan Recorded) Intravenous Once [name], MD        PHYSICAL EXAMINATION: ECOG PERFORMANCE STATUS: 1 - Symptomatic but completely ambulatory  Vitals:   01/27/19 1104  BP: 111/63  Pulse: 80  Resp: 18  Temp: 98.9 F (37.2 C)  SpO2: 100%   Filed Weights   01/27/19 1104  Weight: 101 lb 8 oz (46 kg)    GENERAL:alert, no distress and comfortable SKIN: abdomen is slightly pink, no rash  EYES: sclera clear LUNGS: clear to auscultation with normal breathing effort HEART: regular rate & rhythm, no lower extremity edema ABDOMEN:abdomen soft, flat Musculoskeletal:no cyanosis of digits  NEURO: alert & oriented x 3 with fluent speech, normal gait Breast exam: there is soft tissue thickening in right upper outer quadrant spanning 3x2 cm at the site of previously palpable mass. No palpable axillary LN Pac without erythema   LABORATORY DATA:  I have reviewed the data as listed CBC Latest Ref Rng & Units 01/27/2019 01/12/2019 01/06/2019  WBC 4.0 - 10.5 K/uL 16.7(H) 1.2(L) 14.6(H)  Hemoglobin 12.0 - 15.0 g/dL 11.7(L) 13.5 12.8  Hematocrit 36.0 - 46.0 % 35.5(L) 41.1 39.6   Platelets 150 - 400 K/uL 224 90(L) 159     CMP Latest Ref Rng & Units 01/27/2019 01/12/2019 01/06/2019  Glucose 70 - 99 mg/dL 117(H) 103(H) 127(H)  BUN 8 - 23 mg/dL _0 Creatinine 0.44 - 1.00 mg/dL 0.85 0.75 0.81  Sodium 135 - 145 mmol/L 138 138 141  Potassium 3.5 - 5.1 mmol/L 4.2 3.1(L) 3.5  Chloride 98 - 111 mmol/L 104 102 107  CO2 22 - 32 mmol/L _1 Calcium 8.9 - 10.3 mg/dL 9.4 8.5(L) 9.5  Total Protein 6.5 - 8.1 g/dL 6.7 6.5 7.0  Total Bilirubin 0.3 - 1.2 mg/dL 0.4 1.1 0.5  Alkaline Phos 38 - 126 U/L 71 56 56  AST 15 - 41 U/L _2 ALT 0 - 44 U/L 58(H) 24 24      RADIOGRAPHIC STUDIES: I have personally reviewed the radiological images as listed and agreed with the findings in the report. No results found.   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 Burr Medico previously reviewed her secondary biopsy, which shows satellite lesion in right UOQ that is positive for invasive ductal carcinoma; she has multi-focal disease with similar prognostic features. -baseline echo 60-65%; she was referred to cardiology on 01/06/19  -she began neoadjuvant chemotherapy with TCHP on 01/06/19 -she tolerated first cycle poorly during week 1 with insomnia, constipation, poor appetite w 3 lbs weight loss, skin rash, and acid reflux. She was seen by Dr. Burr Medico who added medications for symptom management and she reviewed IVF; symptoms improved  2. Social support -she has friends, no family -she has strong faith in [place]. Our chaplain has reached out to her  DISPO: Ms. Daughtry appears well today.  She initially had constipation after chemo then developed diarrhea for 1.5 weeks. This finally resolved with imodium. She  continues to have mild to moderate fatigue but remains active and functional. She has recovered moderately well from cycle 1 TCHP. Breast exam shows smaller area of soft tissue thickening in the site of previously palpable mass; clinically, she is responding well to treatment. I encouraged her to continue with the treatment plan and reviewed the rationale for neoadjuvant chemotherapy. Labs adequate to proceed with cycle 2 TCHP today. She will return for [facility]  inj on 7/31. Due to her fatigue and diarrhea with cycle 1 she will return weekly for IVF support. She can cancel if she feels it's not needed. F/u with Dr. Burr Medico, and cycle 3 in 3 weeks. I refilled trazadone which is helping her sleep.  All questions were answered. The patient knows to call the clinic with any problems, questions or concerns. No barriers to learning was detected. I spent 20 minutes counseling the patient face to face. The total time spent in the appointment was 25 minutes and more than 50% was on counseling and review of test results     Alla Feeling, NP 01/27/19

## 2019-01-27 ENCOUNTER — Inpatient Hospital Stay (HOSPITAL_BASED_OUTPATIENT_CLINIC_OR_DEPARTMENT_OTHER): Payer: Medicare HMO | Admitting: Nurse Practitioner

## 2019-01-27 ENCOUNTER — Inpatient Hospital Stay: Payer: Medicare HMO

## 2019-01-27 ENCOUNTER — Telehealth: Payer: Self-pay | Admitting: Nurse Practitioner

## 2019-01-27 ENCOUNTER — Encounter: Payer: Self-pay | Admitting: Nurse Practitioner

## 2019-01-27 ENCOUNTER — Other Ambulatory Visit: Payer: Self-pay

## 2019-01-27 VITALS — BP 111/63 | HR 80 | Temp 98.9°F | Resp 18 | Ht 63.0 in | Wt 101.5 lb

## 2019-01-27 DIAGNOSIS — Z5111 Encounter for antineoplastic chemotherapy: Secondary | ICD-10-CM | POA: Diagnosis not present

## 2019-01-27 DIAGNOSIS — D72819 Decreased white blood cell count, unspecified: Secondary | ICD-10-CM | POA: Diagnosis not present

## 2019-01-27 DIAGNOSIS — G4700 Insomnia, unspecified: Secondary | ICD-10-CM | POA: Diagnosis not present

## 2019-01-27 DIAGNOSIS — Z79899 Other long term (current) drug therapy: Secondary | ICD-10-CM | POA: Diagnosis not present

## 2019-01-27 DIAGNOSIS — Z171 Estrogen receptor negative status [ER-]: Secondary | ICD-10-CM

## 2019-01-27 DIAGNOSIS — Z95828 Presence of other vascular implants and grafts: Secondary | ICD-10-CM

## 2019-01-27 DIAGNOSIS — C50411 Malignant neoplasm of upper-outer quadrant of right female breast: Secondary | ICD-10-CM

## 2019-01-27 DIAGNOSIS — D696 Thrombocytopenia, unspecified: Secondary | ICD-10-CM

## 2019-01-27 DIAGNOSIS — R5383 Other fatigue: Secondary | ICD-10-CM

## 2019-01-27 DIAGNOSIS — K5900 Constipation, unspecified: Secondary | ICD-10-CM | POA: Diagnosis not present

## 2019-01-27 LAB — CBC WITH DIFFERENTIAL (CANCER CENTER ONLY)
Abs Immature Granulocytes: 0.2 10*3/uL — ABNORMAL HIGH (ref 0.00–0.07)
Basophils Absolute: 0.1 10*3/uL (ref 0.0–0.1)
Basophils Relative: 0 %
Eosinophils Absolute: 0 10*3/uL (ref 0.0–0.5)
Eosinophils Relative: 0 %
HCT: 35.5 % — ABNORMAL LOW (ref 36.0–46.0)
Hemoglobin: 11.7 g/dL — ABNORMAL LOW (ref 12.0–15.0)
Immature Granulocytes: 1 %
Lymphocytes Relative: 5 %
Lymphs Abs: 0.9 10*3/uL (ref 0.7–4.0)
MCH: 30 pg (ref 26.0–34.0)
MCHC: 33 g/dL (ref 30.0–36.0)
MCV: 91 fL (ref 80.0–100.0)
Monocytes Absolute: 1.3 10*3/uL — ABNORMAL HIGH (ref 0.1–1.0)
Monocytes Relative: 8 %
Neutro Abs: 14.2 10*3/uL — ABNORMAL HIGH (ref 1.7–7.7)
Neutrophils Relative %: 86 %
Platelet Count: 224 10*3/uL (ref 150–400)
RBC: 3.9 MIL/uL (ref 3.87–5.11)
RDW: 13.9 % (ref 11.5–15.5)
WBC Count: 16.7 10*3/uL — ABNORMAL HIGH (ref 4.0–10.5)
nRBC: 0 % (ref 0.0–0.2)

## 2019-01-27 LAB — CMP (CANCER CENTER ONLY)
ALT: 58 U/L — ABNORMAL HIGH (ref 0–44)
AST: 25 U/L (ref 15–41)
Albumin: 4.1 g/dL (ref 3.5–5.0)
Alkaline Phosphatase: 71 U/L (ref 38–126)
Anion gap: 8 (ref 5–15)
BUN: 16 mg/dL (ref 8–23)
CO2: 26 mmol/L (ref 22–32)
Calcium: 9.4 mg/dL (ref 8.9–10.3)
Chloride: 104 mmol/L (ref 98–111)
Creatinine: 0.85 mg/dL (ref 0.44–1.00)
GFR, Est AFR Am: >60 mL/min (ref >60)
GFR, Estimated: >60 mL/min (ref >60)
Glucose, Bld: 117 mg/dL — ABNORMAL HIGH (ref 70–99)
Potassium: 4.2 mmol/L (ref 3.5–5.1)
Sodium: 138 mmol/L (ref 135–145)
Total Bilirubin: 0.4 mg/dL (ref 0.3–1.2)
Total Protein: 6.7 g/dL (ref 6.5–8.1)

## 2019-01-27 MED ORDER — DIPHENHYDRAMINE HCL 25 MG PO CAPS
ORAL_CAPSULE | ORAL | Status: AC
  Filled 2019-01-27: qty 2

## 2019-01-27 MED ORDER — DIPHENHYDRAMINE HCL 25 MG PO CAPS
50.0000 mg | ORAL_CAPSULE | Freq: Once | ORAL | Status: AC
  Administered 2019-01-27: 50 mg via ORAL

## 2019-01-27 MED ORDER — ACETAMINOPHEN 325 MG PO TABS
ORAL_TABLET | ORAL | Status: AC
  Filled 2019-01-27: qty 2

## 2019-01-27 MED ORDER — SODIUM CHLORIDE 0.9 % IV SOLN
75.0000 mg/m2 | Freq: Once | INTRAVENOUS | Status: AC
  Administered 2019-01-27: 110 mg via INTRAVENOUS
  Filled 2019-01-27: qty 11

## 2019-01-27 MED ORDER — TRASTUZUMAB-ANNS CHEMO 420 MG IV SOLR
6.0000 mg/kg | Freq: Once | INTRAVENOUS | Status: AC
  Administered 2019-01-27: 273 mg via INTRAVENOUS
  Filled 2019-01-27: qty 13

## 2019-01-27 MED ORDER — SODIUM CHLORIDE 0.9% FLUSH
10.0000 mL | Freq: Once | INTRAVENOUS | Status: AC
  Administered 2019-01-27: 10 mL
  Filled 2019-01-27: qty 10

## 2019-01-27 MED ORDER — SODIUM CHLORIDE 0.9 % IV SOLN
Freq: Once | INTRAVENOUS | Status: AC
  Administered 2019-01-27: 12:00:00 via INTRAVENOUS
  Filled 2019-01-27: qty 250

## 2019-01-27 MED ORDER — PALONOSETRON HCL INJECTION 0.25 MG/5ML
0.2500 mg | Freq: Once | INTRAVENOUS | Status: AC
  Administered 2019-01-27: 0.25 mg via INTRAVENOUS

## 2019-01-27 MED ORDER — PALONOSETRON HCL INJECTION 0.25 MG/5ML
INTRAVENOUS | Status: AC
  Filled 2019-01-27: qty 5

## 2019-01-27 MED ORDER — SODIUM CHLORIDE 0.9 % IV SOLN
Freq: Once | INTRAVENOUS | Status: AC
  Administered 2019-01-27: 13:00:00 via INTRAVENOUS
  Filled 2019-01-27: qty 5

## 2019-01-27 MED ORDER — SODIUM CHLORIDE 0.9% FLUSH
10.0000 mL | INTRAVENOUS | Status: DC | PRN
  Administered 2019-01-27: 10 mL
  Filled 2019-01-27: qty 10

## 2019-01-27 MED ORDER — SODIUM CHLORIDE 0.9 % IV SOLN
420.0000 mg | Freq: Once | INTRAVENOUS | Status: AC
  Administered 2019-01-27: 420 mg via INTRAVENOUS
  Filled 2019-01-27: qty 14

## 2019-01-27 MED ORDER — TRAZODONE HCL 50 MG PO TABS: 50.0000 mg | ORAL_TABLET | Freq: Every day | ORAL | 1 refills | Status: DC

## 2019-01-27 MED ORDER — SODIUM CHLORIDE 0.9 % IV SOLN
390.0000 mg | Freq: Once | INTRAVENOUS | Status: AC
  Administered 2019-01-27: 390 mg via INTRAVENOUS
  Filled 2019-01-27: qty 39

## 2019-01-27 MED ORDER — HEPARIN SOD (PORK) LOCK FLUSH 100 UNIT/ML IV SOLN
500.0000 [IU] | Freq: Once | INTRAVENOUS | Status: AC | PRN
  Administered 2019-01-27: 500 [IU]
  Filled 2019-01-27: qty 5

## 2019-01-27 MED ORDER — ACETAMINOPHEN 325 MG PO TABS
650.0000 mg | ORAL_TABLET | Freq: Once | ORAL | Status: AC
  Administered 2019-01-27: 650 mg via ORAL

## 2019-01-27 NOTE — Patient Instructions (Signed)
[facility] Discharge Instructions for Patients Receiving Chemotherapy  Today you received the following chemotherapy agents: Trastuzumab (Herceptin), Pertuzumab (Perjeta), Docetaxel (Taxotere), and Carboplatin (Paraplatin)  To help prevent nausea and vomiting after your treatment, we encourage you to take your nausea medication as directed.   If you develop nausea and vomiting that is not controlled by your nausea medication, call the clinic.   BELOW ARE SYMPTOMS THAT SHOULD BE REPORTED IMMEDIATELY:  *FEVER GREATER THAN 100.5 F  *CHILLS WITH OR WITHOUT FEVER  NAUSEA AND VOMITING THAT IS NOT CONTROLLED WITH YOUR NAUSEA MEDICATION  *UNUSUAL SHORTNESS OF BREATH  *UNUSUAL BRUISING OR BLEEDING  TENDERNESS IN MOUTH AND THROAT WITH OR WITHOUT PRESENCE OF ULCERS  *URINARY PROBLEMS  *BOWEL PROBLEMS  UNUSUAL RASH Items with * indicate a potential emergency and should be followed up as soon as possible.  Feel free to call the clinic should you have any questions or concerns. The clinic phone number is [phone].  Please show the [place] [facility] at check-in to the Emergency Department and triage nurse.  Coronavirus (COVID-19) Are you at risk?  Are you at risk for the Coronavirus (COVID-19)?  To be considered HIGH RISK for Coronavirus (COVID-19), you have to meet the following criteria:  . Traveled to [place], [place], [place], [place] or [place]; or in the [place] to [place], [place], [place], or [place]; and have fever, cough, and shortness of breath within the last 2 weeks of travel OR . Been in close contact with a person diagnosed with COVID-19 within the last 2 weeks and have fever, cough, and shortness of breath . IF YOU DO NOT MEET THESE CRITERIA, YOU ARE CONSIDERED LOW RISK FOR COVID-19.  What to do if you are HIGH RISK for COVID-19?  Marland Kitchen If you are having a medical emergency, call 911. . Seek medical care right away. Before you go to a  doctor's office, urgent care or emergency department, call ahead and tell them about your recent travel, contact with someone diagnosed with COVID-19, and your symptoms. You should receive instructions from your physician's office regarding next steps of care.  . When you arrive at healthcare provider, tell the healthcare staff immediately you have returned from visiting [place], [place], [place], [place] or [place]; or traveled in the [place] to [place], [place], [place], or [place]; in the last two weeks or you have been in close contact with a person diagnosed with COVID-19 in the last 2 weeks.   . Tell the health care staff about your symptoms: fever, cough and shortness of breath. . After you have been seen by a medical provider, you will be either: o Tested for (COVID-19) and discharged home on quarantine except to seek medical care if symptoms worsen, and asked to  - Stay home and avoid contact with others until you get your results (4-5 days)  - Avoid travel on public transportation if possible (such as bus, train, or airplane) or o Sent to the Emergency Department by EMS for evaluation, COVID-19 testing, and possible admission depending on your condition and test results.  What to do if you are LOW RISK for COVID-19?  Reduce your risk of any infection by using the same precautions used for avoiding the common cold or flu:  Marland Kitchen Wash your hands often with soap and warm water for at least 20 seconds.  If soap and water are not readily available, use an alcohol-based hand sanitizer with at least  60% alcohol.  . If coughing or sneezing, cover your mouth and nose by coughing or sneezing into the elbow areas of your shirt or coat, into a tissue or into your sleeve (not your hands). . Avoid shaking hands with others and consider head nods or verbal greetings only. . Avoid touching your eyes, nose, or mouth with unwashed hands.  . Avoid close contact with people who are sick. . Avoid  places or events with large numbers of people in one location, like concerts or sporting events. . Carefully consider travel plans you have or are making. . If you are planning any travel outside or inside the [place], visit the CDC's Travelers' Health webpage for the latest health notices. . If you have some symptoms but not all symptoms, continue to monitor at home and seek medical attention if your symptoms worsen. . If you are having a medical emergency, call 911.   [place] / e-Visit: [email or web address]         [facility]: [facility]: [phone]                   [facility]: [phone]

## 2019-01-27 NOTE — Progress Notes (Signed)
Nutrition Follow-up:  Patient with right breast cancer.  Patient receiving TCHP and followed by Dr. Burr Medico.    Met with patient during infusion.  Patient reports that she is doing ok.  Reports that the first week after treatment was rough.  Noted issues with diarrhea but improved.  Reports reflux has improved with medication.  Continues to have issues with foods not tasting well (ie had pizza last night with "off" taste).  Reports that she has been making smoothies especially in the morning (variety of fruits with water or ice).      Medications: reviewed  Labs: reviewed  Anthropometrics:   Weight stable at 101 lb 8 oz today   NUTRITION DIAGNOSIS:  Inadequate oral intake continues   INTERVENTION:  Discussed strategies to increase calories and protein in smoothie recipe.  Provided smoothie recipes for patient to try.   Reviewed ways to improve taste of foods.     MONITORING, EVALUATION, GOAL:  Patient will consume adequate calories to prevent further weight loss   NEXT VISIT:  Wednesday, August 19 during infusion  Christina Sexton, [place], [place] Registered Dietitian [phone] (pager)

## 2019-01-27 NOTE — Telephone Encounter (Signed)
Scheduled appt per 7/29 los.  Patient will get a printout after treatment.

## 2019-01-29 ENCOUNTER — Inpatient Hospital Stay: Payer: Medicare HMO

## 2019-01-29 ENCOUNTER — Other Ambulatory Visit: Payer: Self-pay

## 2019-01-29 VITALS — BP 108/66 | HR 88 | Temp 98.6°F | Resp 18

## 2019-01-29 DIAGNOSIS — C50411 Malignant neoplasm of upper-outer quadrant of right female breast: Secondary | ICD-10-CM | POA: Diagnosis not present

## 2019-01-29 DIAGNOSIS — Z171 Estrogen receptor negative status [ER-]: Secondary | ICD-10-CM

## 2019-01-29 DIAGNOSIS — D72819 Decreased white blood cell count, unspecified: Secondary | ICD-10-CM | POA: Diagnosis not present

## 2019-01-29 DIAGNOSIS — R5383 Other fatigue: Secondary | ICD-10-CM | POA: Diagnosis not present

## 2019-01-29 DIAGNOSIS — G4700 Insomnia, unspecified: Secondary | ICD-10-CM | POA: Diagnosis not present

## 2019-01-29 DIAGNOSIS — Z5111 Encounter for antineoplastic chemotherapy: Secondary | ICD-10-CM | POA: Diagnosis not present

## 2019-01-29 DIAGNOSIS — Z79899 Other long term (current) drug therapy: Secondary | ICD-10-CM | POA: Diagnosis not present

## 2019-01-29 DIAGNOSIS — D696 Thrombocytopenia, unspecified: Secondary | ICD-10-CM | POA: Diagnosis not present

## 2019-01-29 DIAGNOSIS — K5900 Constipation, unspecified: Secondary | ICD-10-CM | POA: Diagnosis not present

## 2019-01-29 MED ORDER — PEGFILGRASTIM-CBQV 6 MG/0.6ML ~~LOC~~ SOSY
PREFILLED_SYRINGE | SUBCUTANEOUS | Status: AC
  Filled 2019-01-29: qty 0.6

## 2019-01-29 MED ORDER — PEGFILGRASTIM-CBQV 6 MG/0.6ML ~~LOC~~ SOSY
6.0000 mg | PREFILLED_SYRINGE | Freq: Once | SUBCUTANEOUS | Status: AC
  Administered 2019-01-29: 11:00:00 6 mg via SUBCUTANEOUS

## 2019-02-02 ENCOUNTER — Telehealth: Payer: Self-pay | Admitting: *Deleted

## 2019-02-02 NOTE — Telephone Encounter (Signed)
Received vm message from patient.  She is requesting a different medication for sleep.  She states she is only sleeping 2-3 hours a night and that the trazadone is not helping now.  Please advise

## 2019-02-02 NOTE — Telephone Encounter (Signed)
It's probably related to recent steroids with chemo. We can try reducing post-chemo dex next cycle. Has she tried benadryl? She can try that, sometimes causes next day drowsiness.  Thanks, Regan Rakers

## 2019-02-03 ENCOUNTER — Inpatient Hospital Stay: Payer: Medicare HMO | Attending: Hematology

## 2019-02-03 ENCOUNTER — Other Ambulatory Visit: Payer: Self-pay

## 2019-02-03 VITALS — BP 110/60 | HR 105 | Temp 98.3°F | Resp 16

## 2019-02-03 DIAGNOSIS — C50411 Malignant neoplasm of upper-outer quadrant of right female breast: Secondary | ICD-10-CM | POA: Insufficient documentation

## 2019-02-03 DIAGNOSIS — Z5111 Encounter for antineoplastic chemotherapy: Secondary | ICD-10-CM | POA: Diagnosis present

## 2019-02-03 DIAGNOSIS — R630 Anorexia: Secondary | ICD-10-CM | POA: Diagnosis not present

## 2019-02-03 DIAGNOSIS — Z171 Estrogen receptor negative status [ER-]: Secondary | ICD-10-CM | POA: Insufficient documentation

## 2019-02-03 DIAGNOSIS — K5900 Constipation, unspecified: Secondary | ICD-10-CM | POA: Insufficient documentation

## 2019-02-03 DIAGNOSIS — R21 Rash and other nonspecific skin eruption: Secondary | ICD-10-CM | POA: Insufficient documentation

## 2019-02-03 DIAGNOSIS — R739 Hyperglycemia, unspecified: Secondary | ICD-10-CM | POA: Diagnosis not present

## 2019-02-03 DIAGNOSIS — Z79899 Other long term (current) drug therapy: Secondary | ICD-10-CM | POA: Insufficient documentation

## 2019-02-03 DIAGNOSIS — K219 Gastro-esophageal reflux disease without esophagitis: Secondary | ICD-10-CM | POA: Insufficient documentation

## 2019-02-03 DIAGNOSIS — Z95828 Presence of other vascular implants and grafts: Secondary | ICD-10-CM

## 2019-02-03 DIAGNOSIS — G4700 Insomnia, unspecified: Secondary | ICD-10-CM | POA: Insufficient documentation

## 2019-02-03 MED ORDER — HEPARIN SOD (PORK) LOCK FLUSH 100 UNIT/ML IV SOLN
500.0000 [IU] | Freq: Once | INTRAVENOUS | Status: AC | PRN
  Administered 2019-02-03: 500 [IU]
  Filled 2019-02-03: qty 5

## 2019-02-03 MED ORDER — SODIUM CHLORIDE 0.9 % IV SOLN
Freq: Once | INTRAVENOUS | Status: AC
  Administered 2019-02-03: 14:00:00 via INTRAVENOUS
  Filled 2019-02-03: qty 250

## 2019-02-03 MED ORDER — SODIUM CHLORIDE 0.9% FLUSH
10.0000 mL | Freq: Once | INTRAVENOUS | Status: AC | PRN
  Administered 2019-02-03: 10 mL
  Filled 2019-02-03: qty 10

## 2019-02-03 NOTE — Patient Instructions (Signed)
Fatigue If you have fatigue, you feel tired all the time and have a lack of energy or a lack of motivation. Fatigue may make it difficult to start or complete tasks because of exhaustion. In general, occasional or mild fatigue is often a normal response to activity or life. However, long-lasting (chronic) or extreme fatigue may be a symptom of a medical condition. Follow these instructions at home: General instructions  Watch your fatigue for any changes.  Go to bed and get up at the same time every day.  Avoid fatigue by pacing yourself during the day and getting enough sleep at night.  Maintain a healthy weight. Medicines  Take over-the-counter and prescription medicines only as told by your health care provider.  Take a multivitamin, if told by your health care provider.  Do not use herbal or dietary supplements unless they are approved by your health care provider. Activity   Exercise regularly, as told by your health care provider.  Use or practice techniques to help you relax, such as yoga, tai chi, meditation, or massage therapy. Eating and drinking   Avoid heavy meals in the evening.  Eat a well-balanced diet, which includes lean proteins, whole grains, plenty of fruits and vegetables, and low-fat dairy products.  Avoid consuming too much caffeine.  Avoid the use of alcohol.  Drink enough fluid to keep your urine pale yellow. Lifestyle  Change situations that cause you stress. Try to keep your work and personal schedule in balance.  Do not use any products that contain nicotine or tobacco, such as cigarettes and e-cigarettes. If you need help quitting, ask your health care provider.  Do not use drugs. Contact a health care provider if:  Your fatigue does not get better.  You have a fever.  You suddenly lose or gain weight.  You have headaches.  You have trouble falling asleep or sleeping through the night.  You feel angry, guilty, anxious, or sad.   You are unable to have a bowel movement (constipation).  Your skin is dry.  You have swelling in your legs or another part of your body. Get help right away if:  You feel confused.  Your vision is blurry.  You feel faint or you pass out.  You have a severe headache.  You have severe pain in your abdomen, your back, or the area between your waist and hips (pelvis).  You have chest pain, shortness of breath, or an irregular or fast heartbeat.  You are unable to urinate, or you urinate less than normal.  You have abnormal bleeding, such as bleeding from the rectum, vagina, nose, lungs, or nipples.  You vomit blood.  You have thoughts about hurting yourself or others. If you ever feel like you may hurt yourself or others, or have thoughts about taking your own life, get help right away. You can go to your nearest emergency department or call:  Your local emergency services (911 in the [place]  A suicide crisis helpline, such as the [place] at (505)831-7817. This is open 24 hours a day. Summary  If you have fatigue, you feel tired all the time and have a lack of energy or a lack of motivation.  Fatigue may make it difficult to start or complete tasks because of exhaustion.  Long-lasting (chronic) or extreme fatigue may be a symptom of a medical condition.  Exercise regularly, as told by your health care provider.  Change situations that cause you stress. Try to keep your  work and personal schedule in balance. This information is not intended to replace advice given to you by your health care provider. Make sure you discuss any questions you have with your health care provider. Document Released: 04/14/2007 Document Revised: 10/08/2018 Document Reviewed: 03/12/2017 Elsevier Patient Education  [employer].   Nausea, Adult Nausea is feeling sick to your stomach or feeling that you are about to throw up (vomit). Feeling sick to your stomach  is usually not serious, but it may be an early sign of a more serious medical problem. As you feel sicker to your stomach, you may throw up. If you throw up, or if you are not able to drink enough fluids, there is a risk that you may lose too much water in your body (get dehydrated). If you lose too much water in your body, you may:  Feel tired.  Feel thirsty.  Have a dry mouth.  Have cracked lips.  Go pee (urinate) less often. Older adults and people who have other diseases or a weak body defense system (immune system) have a higher risk of losing too much water in the body. The main goals of treating this condition are:  To relieve your nausea.  To ensure your nausea occurs less often.  To prevent throwing up and losing too much fluid. Follow these instructions at home: Watch your symptoms for any changes. Tell your doctor about them. Follow these instructions as told by your doctor. Eating and drinking      Take an ORS (oral rehydration solution). This is a drink that is sold at pharmacies and stores.  Drink clear fluids in small amounts as you are able. These include: ? Water. ? Ice chips. ? Fruit juice that has water added (diluted fruit juice). ? Low-calorie sports drinks.  Eat bland, easy-to-digest foods in small amounts as you are able, such as: ? Bananas. ? Applesauce. ? Rice. ? Low-fat (lean) meats. ? Toast. ? Crackers.  Avoid drinking fluids that have a lot of sugar or caffeine in them. This includes energy drinks, sports drinks, and soda.  Avoid alcohol.  Avoid spicy or fatty foods. General instructions  Take over-the-counter and prescription medicines only as told by your doctor.  Rest at home while you get better.  Drink enough fluid to keep your pee (urine) pale yellow.  Take slow and deep breaths when you feel sick to your stomach.  Avoid food or things that have strong smells.  Wash your hands often with soap and water. If you cannot use  soap and water, use hand sanitizer.  Make sure that all people in your home wash their hands well and often.  Keep all follow-up visits as told by your doctor. This is important. Contact a doctor if:  You feel sicker to your stomach.  You feel sick to your stomach for more than 2 days.  You throw up.  You are not able to drink fluids without throwing up.  You have new symptoms.  You have a fever.  You have a headache.  You have muscle cramps.  You have a rash.  You have pain while peeing.  You feel light-headed or dizzy. Get help right away if:  You have pain in your chest, neck, arm, or jaw.  You feel very weak or you pass out (faint).  You have throw up that is bright red or looks like coffee grounds.  You have bloody or black poop (stools) or poop that looks like tar.  You have a very bad headache, a stiff neck, or both.  You have very bad pain, cramping, or bloating in your belly (abdomen).  You have trouble breathing or you are breathing very quickly.  Your heart is beating very quickly.  Your skin feels cold and clammy.  You feel confused.  You have signs of losing too much water in your body, such as: ? Dark pee, very little pee, or no pee. ? Cracked lips. ? Dry mouth. ? Sunken eyes. ? Sleepiness. ? Weakness. These symptoms may be an emergency. Do not wait to see if the symptoms will go away. Get medical help right away. Call your local emergency services (911 in the [place] Do not drive yourself to the hospital. Summary  Nausea is feeling sick to your stomach or feeling that you are about to throw up (vomit).  If you throw up, or if you are not able to drink enough fluids, there is a risk that you may lose too much water in your body (get dehydrated).  Eat and drink what your doctor tells you. Take over-the-counter and prescription medicines only as told by your doctor.  Contact a doctor right away if your symptoms get worse or you have new  symptoms.  Keep all follow-up visits as told by your doctor. This is important. This information is not intended to replace advice given to you by your health care provider. Make sure you discuss any questions you have with your health care provider. Document Released: 06/06/2011 Document Revised: 11/25/2017 Document Reviewed: 11/25/2017 Elsevier Patient Education  2020 [employer].

## 2019-02-03 NOTE — Telephone Encounter (Signed)
Can you call her? Or have Christina Sexton's nurse call?

## 2019-02-10 ENCOUNTER — Other Ambulatory Visit: Payer: Self-pay

## 2019-02-10 ENCOUNTER — Inpatient Hospital Stay: Payer: Medicare HMO

## 2019-02-10 ENCOUNTER — Telehealth: Payer: Self-pay | Admitting: Hematology

## 2019-02-10 VITALS — BP 131/69 | HR 102 | Temp 98.2°F | Resp 18 | Ht 63.0 in | Wt 99.6 lb

## 2019-02-10 DIAGNOSIS — K219 Gastro-esophageal reflux disease without esophagitis: Secondary | ICD-10-CM | POA: Diagnosis not present

## 2019-02-10 DIAGNOSIS — G4700 Insomnia, unspecified: Secondary | ICD-10-CM | POA: Diagnosis not present

## 2019-02-10 DIAGNOSIS — Z5111 Encounter for antineoplastic chemotherapy: Secondary | ICD-10-CM | POA: Diagnosis not present

## 2019-02-10 DIAGNOSIS — K5900 Constipation, unspecified: Secondary | ICD-10-CM | POA: Diagnosis not present

## 2019-02-10 DIAGNOSIS — Z79899 Other long term (current) drug therapy: Secondary | ICD-10-CM | POA: Diagnosis not present

## 2019-02-10 DIAGNOSIS — C50411 Malignant neoplasm of upper-outer quadrant of right female breast: Secondary | ICD-10-CM

## 2019-02-10 DIAGNOSIS — R21 Rash and other nonspecific skin eruption: Secondary | ICD-10-CM | POA: Diagnosis not present

## 2019-02-10 DIAGNOSIS — Z95828 Presence of other vascular implants and grafts: Secondary | ICD-10-CM

## 2019-02-10 DIAGNOSIS — Z171 Estrogen receptor negative status [ER-]: Secondary | ICD-10-CM | POA: Diagnosis not present

## 2019-02-10 DIAGNOSIS — R739 Hyperglycemia, unspecified: Secondary | ICD-10-CM | POA: Diagnosis not present

## 2019-02-10 DIAGNOSIS — R630 Anorexia: Secondary | ICD-10-CM | POA: Diagnosis not present

## 2019-02-10 MED ORDER — SODIUM CHLORIDE 0.9 % IV SOLN
Freq: Once | INTRAVENOUS | Status: AC
  Administered 2019-02-10: 11:00:00 via INTRAVENOUS
  Filled 2019-02-10: qty 250

## 2019-02-10 MED ORDER — HEPARIN SOD (PORK) LOCK FLUSH 100 UNIT/ML IV SOLN
500.0000 [IU] | Freq: Once | INTRAVENOUS | Status: AC | PRN
  Administered 2019-02-10: 500 [IU]
  Filled 2019-02-10: qty 5

## 2019-02-10 MED ORDER — SODIUM CHLORIDE 0.9% FLUSH
10.0000 mL | Freq: Once | INTRAVENOUS | Status: AC | PRN
  Administered 2019-02-10: 10 mL
  Filled 2019-02-10: qty 10

## 2019-02-10 NOTE — Progress Notes (Signed)
Pt received 1L IVF NS today, tolerated well.  Reports feeling a bit better at end of tx.  Ate and drank during infusion without any issues.  Pt reports some trouble with mild insomnia, MD Burr Medico made aware.

## 2019-02-10 NOTE — Telephone Encounter (Signed)
Pt called regarding insomnia issue.  It started chemotherapy, and has been persistent but worse around chemo.  She has tried trazodone 50 mg at night, still only gets 3 to 4 hours sleep at night.  I recommend her to try over-the-counter melatonin, or Benadryl as a sleep aid. We also discussed sleep hygiene, and exercise to enhance her sleep at night.  She agrees to follow-up.  If she still has significant insomnia issues, I recommend her try Ambien.  She is little reluctant due to the concern of dependence, and wants to increase trazodone to 100 mg as needed for now.  Will reassess on her next visit. She appreciated the call.   [name]  02/10/2019

## 2019-02-10 NOTE — Patient Instructions (Signed)

## 2019-02-15 ENCOUNTER — Other Ambulatory Visit: Payer: Self-pay | Admitting: Hematology

## 2019-02-15 NOTE — Progress Notes (Signed)
Christina Sexton   Telephone:([phone] [phone]   Clinic Follow up Note   Patient Care Team: Deland Pretty, MD as PCP - General (Internal Medicine) [name], RN as Oncology Nurse Navigator [name], RN as Oncology Nurse Navigator [name], MD as Consulting Physician (General Surgery) [name], MD as Consulting Physician (Hematology) [name], MD as Attending Physician (Radiation Oncology)  Date of Service:  02/17/2019  CHIEF COMPLAINT: F/u of right breast cancer  SUMMARY OF ONCOLOGIC HISTORY: Oncology History  Malignant neoplasm of upper-outer quadrant of right female breast ([place])  12/07/2018 Mammogram   Mammogram 12/07/18  IMPRESSION: 1. 3 cm mass in the 11 o'clock position of the right breast, 4 cm the nipple, measuring 3 x 2 x 2 cm, highly suspicious for breast carcinoma. No enlarged or abnormal right axillary lymph nodes.   12/09/2018 Cancer Staging   Staging form: Breast, AJCC 8th Edition - Clinical stage from 12/09/2018: Stage IIA (cT2, cN0, cM0, G3, ER-, PR-, HER2+) - Signed by [name], MD on 12/15/2018   12/09/2018 Initial Biopsy   Diagnosis 12/09/18 Breast, right, needle core biopsy, 11 o'clock - INVASIVE DUCTAL CARCINOMA. - DUCTAL CARCINOMA IN SITU. - SEE COMMENT.   12/09/2018 Receptors her2   The tumor cells are POSITIVE for Her2 (3+). Estrogen Receptor: 0%, NEGATIVE Progesterone Receptor: 0%, NEGATIVE Proliferation Marker Ki67: 30%   12/15/2018 Initial Diagnosis   Malignant neoplasm of upper-outer quadrant of right female breast ([place])   12/21/2018 Breast MRI   IMPRESSION: 1. Approximate 4 cm mass involving the UPPER RIGHT breast with extension into the UPPER INNER QUADRANT and UPPER OUTER QUADRANT, predominantly located in the [place], biopsy-proven invasive ductal carcinoma and DCIS. The mass abuts the chest wall though I cannot confirm pectoralis muscle invasion. 2. Superficial satellite mass versus  pathologic intramammary lymph node in the UPPER OUTER QUADRANT of the RIGHT breast measuring approximately 1 cm, located LATERAL to the dominant mass. 3. No MRI evidence of malignancy involving the LEFT breast. 4. No pathologic lymphadenopathy otherwise.   12/24/2018 Pathology Results   Diagnosis Breast, right, needle core biopsy, upper outer quadrant, peripheral to the recently biopsy proven breast carcinoma - INVASIVE DUCTAL CARCINOMA. SEE COMMENT. Results: IMMUNOHISTOCHEMICAL AND MORPHOMETRIC ANALYSIS PERFORMED MANUALLY The tumor cells are POSITIVE for Her2 (3+). Estrogen Receptor: 0%, NEGATIVE Progesterone Receptor: 0%, NEGATIVE Proliferation Marker Ki67: 40%   01/06/2019 -  Chemotherapy   Neoadjuvant chemotherapy TCHP beginning 01/06/19       CURRENT THERAPY:  Neoadjuvant chemotherapy TCHP beginning 01/06/19  INTERVAL HISTORY:  Christina Sexton is here for a follow up and treatment. She presents to the clinic alone. She note when she checks her pulse it will be fast the day after infusion from heart palpitations. She notes she has diarrhea, 3-4 times a day at times. She has been using imodium which helps her.   REVIEW OF SYSTEMS:   Constitutional: Denies fevers, chills or abnormal weight loss Eyes: Denies blurriness of vision Ears, nose, mouth, throat, and face: Denies mucositis or sore throat Respiratory: Denies cough, dyspnea or wheezes Cardiovascular: Denies chest discomfort or lower extremity swelling (+) palpitations the day after infusion Gastrointestinal:  Denies nausea, heartburn (+) diarrhea  Skin: Denies abnormal skin rashes Lymphatics: Denies new lymphadenopathy or easy bruising Neurological:Denies numbness, tingling or new weaknesses Behavioral/Psych: Mood is stable, no new changes  All other systems were reviewed with the patient and are negative.  MEDICAL HISTORY:  Past Medical History:  Diagnosis  Date  . Allergy   . Anemia   . Asthma   . Cancer ([place])     Breast-R  . Chronic kidney disease   . Hyperlipidemia   . Hypertension   . Kidney stone   . Mitral valve prolapse   . Osteoporosis     SURGICAL HISTORY: Past Surgical History:  Procedure Laterality Date  . BREAST BIOPSY Left 12/27/2015   benign  . BREAST BIOPSY Right 07/26/2013   benign  . BREAST EXCISIONAL BIOPSY Right 09/08/2013   high risk   . PORTACATH PLACEMENT N/A 12/28/2018   Procedure: INSERTION PORT-A-CATH;  Surgeon: [name], MD;  Location: [facility];  Service: General;  Laterality: N/A;  . SHOULDER SURGERY      I have reviewed the social history and family history with the patient and they are unchanged from previous note.  ALLERGIES:  has No Known Allergies.  MEDICATIONS:  Current Outpatient Medications  Medication Sig Dispense Refill  . atorvastatin (LIPITOR) 20 MG tablet Take 20 mg by mouth daily.     Marland Kitchen dexamethasone (DECADRON) 4 MG tablet Take 2 tablets (8 mg total) by mouth 2 (two) times daily. Start the day before Taxotere. Take once the day after, then 2 times a day x 2d. 30 tablet 1  . lidocaine-prilocaine (EMLA) cream Apply to affected area once 30 g 3  . ondansetron (ZOFRAN) 8 MG tablet Take 1 tablet (8 mg total) by mouth 2 (two) times daily as needed (Nausea or vomiting). Begin 4 days after chemotherapy. 30 tablet 1  . oxyCODONE (OXY IR/ROXICODONE) 5 MG immediate release tablet Take 1 tablet (5 mg total) by mouth every 6 (six) hours as needed for severe pain. 8 tablet 0  . pantoprazole (PROTONIX) 20 MG tablet TAKE 1 TABLET BY MOUTH EVERY DAY 30 tablet 1  . potassium chloride SA (K-DUR) 20 MEQ tablet Take 1 tablet (20 mEq total) by mouth daily. 30 tablet 1  . prochlorperazine (COMPAZINE) 10 MG tablet Take 1 tablet (10 mg total) by mouth every 6 (six) hours as needed (Nausea or vomiting). 30 tablet 1  . traZODone (DESYREL) 50 MG tablet Take 1 tablet (50 mg total) by mouth at bedtime. 30 tablet 1   Current Facility-Administered Medications  Medication Dose  Route Frequency Provider Last Rate Last Dose  . 0.9 %  sodium chloride infusion  500 mL Intravenous Once Nandigam, Venia Minks, MD       Facility-Administered Medications Ordered in Other Visits  Medication Dose Route Frequency Provider Last Rate Last Dose  . CARBOplatin (PARAPLATIN) 390 mg in sodium chloride 0.9 % 250 mL chemo infusion  390 mg Intravenous Once [name], MD      . DOCEtaxel (TAXOTERE) 110 mg in sodium chloride 0.9 % 250 mL chemo infusion  75 mg/m2 (Treatment Plan Recorded) Intravenous Once [name], MD 261 mL/hr at 02/17/19 1343 110 mg at 02/17/19 1343  . heparin lock flush 100 unit/mL  500 Units Intracatheter Once PRN [name], MD      . sodium chloride flush (NS) 0.9 % injection 10 mL  10 mL Intracatheter PRN [name], MD        PHYSICAL EXAMINATION: ECOG PERFORMANCE STATUS: 1 - Symptomatic but completely ambulatory  Vitals:   02/17/19 0841  BP: 126/71  Pulse: (!) 109  Resp: 18  Temp: 99.1 F (37.3 C)  SpO2: 98%   Filed Weights   02/17/19 0841  Weight: 101 lb 11.2 oz (46.1 kg)    GENERAL:alert,  no distress and comfortable SKIN: skin color, texture, turgor are normal, no rashes or significant lesions EYES: normal, Conjunctiva are pink and non-injected, sclera clear  NECK: supple, thyroid normal size, non-tender, without nodularity LYMPH:  no palpable lymphadenopathy in the cervical, axillary  LUNGS: clear to auscultation and percussion with normal breathing effort HEART: regular rate & rhythm and no murmurs and no lower extremity edema ABDOMEN:abdomen soft, non-tender and normal bowel sounds Musculoskeletal:no cyanosis of digits and no clubbing  NEURO: alert & oriented x 3 with fluent speech, no focal motor/sensory deficits BREAST: (+) RUQ breast soft tissue fullness smaller, now 2x1.5cm in RUO quadrant of right breast. Exam of left breast and b/l axilla were normal   LABORATORY DATA:  I have reviewed the data as listed CBC Latest Ref Rng & Units  02/17/2019 01/27/2019 01/12/2019  WBC 4.0 - 10.5 K/uL 16.3(H) 16.7(H) 1.2(L)  Hemoglobin 12.0 - 15.0 g/dL 11.5(L) 11.7(L) 13.5  Hematocrit 36.0 - 46.0 % 35.1(L) 35.5(L) 41.1  Platelets 150 - 400 K/uL 175 224 90(L)     CMP Latest Ref Rng & Units 02/17/2019 01/27/2019 01/12/2019  Glucose 70 - 99 mg/dL 225(H) 117(H) 103(H)  BUN 8 - 23 mg/dL '15 16 19  '$ Creatinine 0.44 - 1.00 mg/dL 0.77 0.85 0.75  Sodium 135 - 145 mmol/L 140 138 138  Potassium 3.5 - 5.1 mmol/L 3.5 4.2 3.1(L)  Chloride 98 - 111 mmol/L 107 104 102  CO2 22 - 32 mmol/L '23 26 26  '$ Calcium 8.9 - 10.3 mg/dL 9.4 9.4 8.5(L)  Total Protein 6.5 - 8.1 g/dL 6.6 6.7 6.5  Total Bilirubin 0.3 - 1.2 mg/dL 0.3 0.4 1.1  Alkaline Phos 38 - 126 U/L 78 71 56  AST 15 - 41 U/L '24 25 15  '$ ALT 0 - 44 U/L 40 58(H) 24      RADIOGRAPHIC STUDIES: I have personally reviewed the radiological images as listed and agreed with the findings in the report. No results found.   ASSESSMENT & PLAN:  Christina Sexton is a 68 y.o. 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 She has invasive ductal carcinoma and DCIS. -She understands surgery is the only curative option.  -Breast MRI shows multifocal disease of right breast. Her 12/24/18 secondary biopsy shows satellite lesion in right UOQ that is positive for invasive ductal carcinoma; with similar prognostic features.  -Given this is high riskcancer, systemic chemotherapy is recommended to reduce her risk of distant cancer recurrence. In order to downstage her cancer and make her a lumpectomy candidate, I started her on neoadjuvant TCHP q3weeks for 6 cycles on 01/06/19 followed by maintenance H/P to complete 1 year of treatment. During which she plans to proceed with surgery followed by RT with Dr. Isidore Moos.  -She has baseline ECHO was 60-65% and PAC was placed.  -She tolerated first cycle TCHP poorly during week 1  with insomnia, constipation, poor appetite and 3lbs weight loss, skin rash and Acid reflux.  Herceptin/Perjeta was dose reduced with cycle 2.  -With dose reduction she is tolerating better with hair loss, diarrhea, nausea, stable weight. Symptom management was reviewed with her.  -S/p cycle 2 her palpable right breast mass is smaller clinically indicating good response.  -Labs reviewed, CBC and CMP WNL except WBC 16.3, HG 11.5, ANC 14.6, BG 225. Overall adequate to proceed with [facility] today, same dose.  -Given hyperglycemia, will reduce her Dexa to '4mg'$  BID the day  before infusion and '4mg'$  once daily for 3 days after infusion. I also recommend she reduce her sugar intake and reduce her carbohydrates the week of chemo.  -F/u in 3 weeks   2. Acid Reflux, Constipation, Insomnia, low appetite, supportive care   -Secondary to chemo -For acid reflux she has Protonix. Her diet was previously reviewed with her.  -For her Insomnia she is to try OTC Melatonin first. If not helping she has Trazodone.  -I previously referred her to dietician to better manage diet and eating.  -For her mild skin rash I advised her to stay out of direct sunlight and to cover her skin.  -She has Zofran and Compazine to use before meal if needed. She can use it in between meals if she is still nauseous.  -For constipation she will continue stool softener or OTC Miralax.  -She has been experiencing moderate diarrhea with chemo. She has been taking Imodium. She declined lomotil because she does not want constipation again. I encouraged her to titrate up imodium dose as this can be taken up to 8 tabs a day.  -I will give her IVFs with her injection on day 3.   3. Social support  -She is single, lives alone and has no children. She lives 1-2 miles from cancer center.  -She notes she has friends but overall not much family support. -she is so far able to do all self-care    4. Palpitation -She has episodes the day after  infusion. I recommend her to monitor her pulse when this occurs. If around 150 range this is too high and she should contact clinic and ED.   5. Hyperglycemia  -BG at 225 today (02/17/19). Secondary to steroids.  -Given hypoglycemia, will reduce her Dexa to '4mg'$  BID the day before infusion and '4mg'$  once daily for 3 days after infusion. I also recommend she reduce her sugar intake and reduce her carbohydrates the week of chemo.  -I will check her A1c at next visit.   PLAN: -Labs reviewed and adequate to proceed with cycle 3TCHP today  -will reduce her dexa due to hyperglycemia  -Lab, flush, f/u and chemo TCHP in 3, 6, 9 weeks.  -IV Fluids on day 3 with GCSF injection     No problem-specific Assessment & Plan notes found for this encounter.   Orders Placed This Encounter  Procedures  . Hemoglobin A1c    Standing Status:   Future    Standing Expiration Date:   02/17/2020   All questions were answered. The patient knows to call the clinic with any problems, questions or concerns. No barriers to learning was detected. I spent 20 minutes counseling the patient face to face. The total time spent in the appointment was 25 minutes and more than 50% was on counseling and review of test results     [name], MD 02/17/2019   I, Joslyn Devon, am acting as scribe for [name], MD.   I have reviewed the above documentation for accuracy and completeness, and I agree with the above.

## 2019-02-17 ENCOUNTER — Inpatient Hospital Stay: Payer: Medicare HMO

## 2019-02-17 ENCOUNTER — Inpatient Hospital Stay (HOSPITAL_BASED_OUTPATIENT_CLINIC_OR_DEPARTMENT_OTHER): Payer: Medicare HMO | Admitting: Hematology

## 2019-02-17 ENCOUNTER — Telehealth: Payer: Self-pay | Admitting: Hematology

## 2019-02-17 ENCOUNTER — Encounter: Payer: Self-pay | Admitting: Hematology

## 2019-02-17 ENCOUNTER — Other Ambulatory Visit: Payer: Self-pay

## 2019-02-17 VITALS — HR 79

## 2019-02-17 VITALS — BP 126/71 | HR 109 | Temp 99.1°F | Resp 18 | Ht 63.0 in | Wt 101.7 lb

## 2019-02-17 DIAGNOSIS — C50411 Malignant neoplasm of upper-outer quadrant of right female breast: Secondary | ICD-10-CM

## 2019-02-17 DIAGNOSIS — G4700 Insomnia, unspecified: Secondary | ICD-10-CM | POA: Diagnosis not present

## 2019-02-17 DIAGNOSIS — Z95828 Presence of other vascular implants and grafts: Secondary | ICD-10-CM

## 2019-02-17 DIAGNOSIS — R739 Hyperglycemia, unspecified: Secondary | ICD-10-CM | POA: Diagnosis not present

## 2019-02-17 DIAGNOSIS — K219 Gastro-esophageal reflux disease without esophagitis: Secondary | ICD-10-CM | POA: Diagnosis not present

## 2019-02-17 DIAGNOSIS — R630 Anorexia: Secondary | ICD-10-CM | POA: Diagnosis not present

## 2019-02-17 DIAGNOSIS — Z171 Estrogen receptor negative status [ER-]: Secondary | ICD-10-CM

## 2019-02-17 DIAGNOSIS — Z5111 Encounter for antineoplastic chemotherapy: Secondary | ICD-10-CM | POA: Diagnosis not present

## 2019-02-17 DIAGNOSIS — R21 Rash and other nonspecific skin eruption: Secondary | ICD-10-CM | POA: Diagnosis not present

## 2019-02-17 DIAGNOSIS — K5900 Constipation, unspecified: Secondary | ICD-10-CM | POA: Diagnosis not present

## 2019-02-17 DIAGNOSIS — Z79899 Other long term (current) drug therapy: Secondary | ICD-10-CM | POA: Diagnosis not present

## 2019-02-17 LAB — CBC WITH DIFFERENTIAL (CANCER CENTER ONLY)
Abs Immature Granulocytes: 0.19 10*3/uL — ABNORMAL HIGH (ref 0.00–0.07)
Basophils Absolute: 0 10*3/uL (ref 0.0–0.1)
Basophils Relative: 0 %
Eosinophils Absolute: 0 10*3/uL (ref 0.0–0.5)
Eosinophils Relative: 0 %
HCT: 35.1 % — ABNORMAL LOW (ref 36.0–46.0)
Hemoglobin: 11.5 g/dL — ABNORMAL LOW (ref 12.0–15.0)
Immature Granulocytes: 1 %
Lymphocytes Relative: 5 %
Lymphs Abs: 0.9 10*3/uL (ref 0.7–4.0)
MCH: 30.7 pg (ref 26.0–34.0)
MCHC: 32.8 g/dL (ref 30.0–36.0)
MCV: 93.6 fL (ref 80.0–100.0)
Monocytes Absolute: 0.6 10*3/uL (ref 0.1–1.0)
Monocytes Relative: 4 %
Neutro Abs: 14.6 10*3/uL — ABNORMAL HIGH (ref 1.7–7.7)
Neutrophils Relative %: 90 %
Platelet Count: 175 10*3/uL (ref 150–400)
RBC: 3.75 MIL/uL — ABNORMAL LOW (ref 3.87–5.11)
RDW: 15.4 % (ref 11.5–15.5)
WBC Count: 16.3 10*3/uL — ABNORMAL HIGH (ref 4.0–10.5)
nRBC: 0 % (ref 0.0–0.2)

## 2019-02-17 LAB — CMP (CANCER CENTER ONLY)
ALT: 40 U/L (ref 0–44)
AST: 24 U/L (ref 15–41)
Albumin: 4 g/dL (ref 3.5–5.0)
Alkaline Phosphatase: 78 U/L (ref 38–126)
Anion gap: 10 (ref 5–15)
BUN: 15 mg/dL (ref 8–23)
CO2: 23 mmol/L (ref 22–32)
Calcium: 9.4 mg/dL (ref 8.9–10.3)
Chloride: 107 mmol/L (ref 98–111)
Creatinine: 0.77 mg/dL (ref 0.44–1.00)
GFR, Est AFR Am: >60 mL/min (ref >60)
GFR, Estimated: >60 mL/min (ref >60)
Glucose, Bld: 225 mg/dL — ABNORMAL HIGH (ref 70–99)
Potassium: 3.5 mmol/L (ref 3.5–5.1)
Sodium: 140 mmol/L (ref 135–145)
Total Bilirubin: 0.3 mg/dL (ref 0.3–1.2)
Total Protein: 6.6 g/dL (ref 6.5–8.1)

## 2019-02-17 MED ORDER — TRASTUZUMAB-ANNS CHEMO 420 MG IV SOLR
6.0000 mg/kg | Freq: Once | INTRAVENOUS | Status: AC
  Administered 2019-02-17: 273 mg via INTRAVENOUS
  Filled 2019-02-17: qty 13

## 2019-02-17 MED ORDER — SODIUM CHLORIDE 0.9 % IV SOLN
Freq: Once | INTRAVENOUS | Status: AC
  Administered 2019-02-17: 11:00:00 via INTRAVENOUS
  Filled 2019-02-17: qty 5

## 2019-02-17 MED ORDER — ACETAMINOPHEN 325 MG PO TABS
650.0000 mg | ORAL_TABLET | Freq: Once | ORAL | Status: AC
  Administered 2019-02-17: 11:00:00 650 mg via ORAL

## 2019-02-17 MED ORDER — SODIUM CHLORIDE 0.9 % IV SOLN
Freq: Once | INTRAVENOUS | Status: AC
  Administered 2019-02-17: 10:00:00 via INTRAVENOUS
  Filled 2019-02-17: qty 250

## 2019-02-17 MED ORDER — SODIUM CHLORIDE 0.9 % IV SOLN
390.0000 mg | Freq: Once | INTRAVENOUS | Status: AC
  Administered 2019-02-17: 390 mg via INTRAVENOUS
  Filled 2019-02-17: qty 39

## 2019-02-17 MED ORDER — SODIUM CHLORIDE 0.9% FLUSH
10.0000 mL | INTRAVENOUS | Status: DC | PRN
  Administered 2019-02-17: 10 mL
  Filled 2019-02-17: qty 10

## 2019-02-17 MED ORDER — SODIUM CHLORIDE 0.9 % IV SOLN
420.0000 mg | Freq: Once | INTRAVENOUS | Status: AC
  Administered 2019-02-17: 420 mg via INTRAVENOUS
  Filled 2019-02-17: qty 14

## 2019-02-17 MED ORDER — ACETAMINOPHEN 325 MG PO TABS
ORAL_TABLET | ORAL | Status: AC
  Filled 2019-02-17: qty 2

## 2019-02-17 MED ORDER — SODIUM CHLORIDE 0.9 % IV SOLN
75.0000 mg/m2 | Freq: Once | INTRAVENOUS | Status: AC
  Administered 2019-02-17: 110 mg via INTRAVENOUS
  Filled 2019-02-17: qty 11

## 2019-02-17 MED ORDER — PALONOSETRON HCL INJECTION 0.25 MG/5ML
0.2500 mg | Freq: Once | INTRAVENOUS | Status: AC
  Administered 2019-02-17: 0.25 mg via INTRAVENOUS

## 2019-02-17 MED ORDER — DIPHENHYDRAMINE HCL 25 MG PO CAPS
50.0000 mg | ORAL_CAPSULE | Freq: Once | ORAL | Status: AC
  Administered 2019-02-17: 11:00:00 50 mg via ORAL

## 2019-02-17 MED ORDER — HEPARIN SOD (PORK) LOCK FLUSH 100 UNIT/ML IV SOLN
500.0000 [IU] | Freq: Once | INTRAVENOUS | Status: AC | PRN
  Administered 2019-02-17: 500 [IU]
  Filled 2019-02-17: qty 5

## 2019-02-17 MED ORDER — PALONOSETRON HCL INJECTION 0.25 MG/5ML
INTRAVENOUS | Status: AC
  Filled 2019-02-17: qty 5

## 2019-02-17 MED ORDER — DIPHENHYDRAMINE HCL 25 MG PO CAPS
ORAL_CAPSULE | ORAL | Status: AC
  Filled 2019-02-17: qty 2

## 2019-02-17 MED ORDER — SODIUM CHLORIDE 0.9% FLUSH
10.0000 mL | Freq: Once | INTRAVENOUS | Status: AC
  Administered 2019-02-17: 10 mL
  Filled 2019-02-17: qty 10

## 2019-02-17 NOTE — Patient Instructions (Signed)
[facility] Discharge Instructions for Patients Receiving Chemotherapy  Today you received the following chemotherapy agents Trastuzumab-anns (KANJINTI), Pertuzumab (PERJETA), Docetaxel (TAXOTERE) & Carboplatin (PARAPLATIN).  To help prevent nausea and vomiting after your treatment, we encourage you to take your nausea medication as prescribed.   If you develop nausea and vomiting that is not controlled by your nausea medication, call the clinic.   BELOW ARE SYMPTOMS THAT SHOULD BE REPORTED IMMEDIATELY:  *FEVER GREATER THAN 100.5 F  *CHILLS WITH OR WITHOUT FEVER  NAUSEA AND VOMITING THAT IS NOT CONTROLLED WITH YOUR NAUSEA MEDICATION  *UNUSUAL SHORTNESS OF BREATH  *UNUSUAL BRUISING OR BLEEDING  TENDERNESS IN MOUTH AND THROAT WITH OR WITHOUT PRESENCE OF ULCERS  *URINARY PROBLEMS  *BOWEL PROBLEMS  UNUSUAL RASH Items with * indicate a potential emergency and should be followed up as soon as possible.  Feel free to call the clinic should you have any questions or concerns. The clinic phone number is [phone].  Please show the [facility] at check-in to the Emergency Department and triage nurse.  Coronavirus (COVID-19) Are you at risk?  Are you at risk for the Coronavirus (COVID-19)?  To be considered HIGH RISK for Coronavirus (COVID-19), you have to meet the following criteria:  . Traveled to [place], [place], [place], [place] or [place]; or in the [place] to [place], [place], [place], or [place]; and have fever, cough, and shortness of breath within the last 2 weeks of travel OR . Been in close contact with a person diagnosed with COVID-19 within the last 2 weeks and have fever, cough, and shortness of breath . IF YOU DO NOT MEET THESE CRITERIA, YOU ARE CONSIDERED LOW RISK FOR COVID-19.  What to do if you are HIGH RISK for COVID-19?  Marland Kitchen If you are having a medical emergency, call 911. . Seek medical care right away. Before you go to a  doctor's office, urgent care or emergency department, call ahead and tell them about your recent travel, contact with someone diagnosed with COVID-19, and your symptoms. You should receive instructions from your physician's office regarding next steps of care.  . When you arrive at healthcare provider, tell the healthcare staff immediately you have returned from visiting [place], [place], [place], [place] or [place]; or traveled in the [place] to [place], [place], [place], or [place]; in the last two weeks or you have been in close contact with a person diagnosed with COVID-19 in the last 2 weeks.   . Tell the health care staff about your symptoms: fever, cough and shortness of breath. . After you have been seen by a medical provider, you will be either: o Tested for (COVID-19) and discharged home on quarantine except to seek medical care if symptoms worsen, and asked to  - Stay home and avoid contact with others until you get your results (4-5 days)  - Avoid travel on public transportation if possible (such as bus, train, or airplane) or o Sent to the Emergency Department by EMS for evaluation, COVID-19 testing, and possible admission depending on your condition and test results.  What to do if you are LOW RISK for COVID-19?  Reduce your risk of any infection by using the same precautions used for avoiding the common cold or flu:  Marland Kitchen Wash your hands often with soap and warm water for at least 20 seconds.  If soap and water are not readily available, use an alcohol-based hand sanitizer with at least  60% alcohol.  . If coughing or sneezing, cover your mouth and nose by coughing or sneezing into the elbow areas of your shirt or coat, into a tissue or into your sleeve (not your hands). . Avoid shaking hands with others and consider head nods or verbal greetings only. . Avoid touching your eyes, nose, or mouth with unwashed hands.  . Avoid close contact with people who are sick. . Avoid  places or events with large numbers of people in one location, like concerts or sporting events. . Carefully consider travel plans you have or are making. . If you are planning any travel outside or inside the [place], visit the CDC's Travelers' Health webpage for the latest health notices. . If you have some symptoms but not all symptoms, continue to monitor at home and seek medical attention if your symptoms worsen. . If you are having a medical emergency, call 911.   [place] / e-Visit: [email or web address]         [facility]: [facility]: [phone]                   [facility]: [phone]

## 2019-02-17 NOTE — Progress Notes (Signed)
Nutrition Follow-up: Patient with right breast cancer.  Patient receiving TCHP and followed by Dr Feng.  Met with patient during infusion.  Patient reports that she is trying to continue to eat whatever she can.  Reports that she has been taking nausea medication and helps her eat a little bit more.  Reports receiving IV fluids after treatment helped her.  Reports that reflux is better.  Has diarrhea but imodium helps.  Reports that she purchased a protein powder to add to smoothies.  She doesn't really like the taste but she is getting it down.  Reports that she continues to try and eat good sources of protein and calories to keep weight up.     Medications: reviewed  Labs: glucose 225 (steriod induced per MD)  Anthropometrics:   Weight 101 lb 11.2 oz stable from last weight of 1010 8 oz on 7/29  NUTRITION DIAGNOSIS: Inadequate oral intake stable  INTERVENTION:  Encouraged patient to continue consuming high protein, high calorie foods to maintain weight. Encouraged taking nausea medication to help symptoms and allow her to eat more.     MONITORING, EVALUATION, GOAL:  Patient will consume adequate calories and protein to prevent further weight loss.    NEXT VISIT: Sept 29 during infusion (Tuesday)   B. , RD, LDN Registered Dietitian [phone] (pager)     

## 2019-02-17 NOTE — Telephone Encounter (Signed)
Scheduled appt per 8/19 los.  Spoke with patient and she is aware of the appt date and time.  Sent MD a staff message in regards to the appt dates in Sept.

## 2019-02-18 ENCOUNTER — Ambulatory Visit: Payer: Medicare HMO

## 2019-02-19 ENCOUNTER — Ambulatory Visit: Payer: Medicare HMO

## 2019-02-20 ENCOUNTER — Other Ambulatory Visit: Payer: Self-pay

## 2019-02-20 ENCOUNTER — Inpatient Hospital Stay: Payer: Medicare HMO

## 2019-02-20 VITALS — BP 137/66 | HR 75 | Temp 98.0°F | Resp 20

## 2019-02-20 DIAGNOSIS — R739 Hyperglycemia, unspecified: Secondary | ICD-10-CM | POA: Diagnosis not present

## 2019-02-20 DIAGNOSIS — K219 Gastro-esophageal reflux disease without esophagitis: Secondary | ICD-10-CM | POA: Diagnosis not present

## 2019-02-20 DIAGNOSIS — K5900 Constipation, unspecified: Secondary | ICD-10-CM | POA: Diagnosis not present

## 2019-02-20 DIAGNOSIS — Z171 Estrogen receptor negative status [ER-]: Secondary | ICD-10-CM | POA: Diagnosis not present

## 2019-02-20 DIAGNOSIS — C50411 Malignant neoplasm of upper-outer quadrant of right female breast: Secondary | ICD-10-CM

## 2019-02-20 DIAGNOSIS — R21 Rash and other nonspecific skin eruption: Secondary | ICD-10-CM | POA: Diagnosis not present

## 2019-02-20 DIAGNOSIS — R630 Anorexia: Secondary | ICD-10-CM | POA: Diagnosis not present

## 2019-02-20 DIAGNOSIS — G4700 Insomnia, unspecified: Secondary | ICD-10-CM | POA: Diagnosis not present

## 2019-02-20 DIAGNOSIS — Z5111 Encounter for antineoplastic chemotherapy: Secondary | ICD-10-CM | POA: Diagnosis not present

## 2019-02-20 DIAGNOSIS — Z79899 Other long term (current) drug therapy: Secondary | ICD-10-CM | POA: Diagnosis not present

## 2019-02-20 DIAGNOSIS — Z95828 Presence of other vascular implants and grafts: Secondary | ICD-10-CM

## 2019-02-20 MED ORDER — PROMETHAZINE HCL 25 MG/ML IJ SOLN: 25.0000 mg | Freq: Once | INTRAMUSCULAR | Status: DC

## 2019-02-20 MED ORDER — SODIUM CHLORIDE 0.9 % IV SOLN
Freq: Once | INTRAVENOUS | Status: AC
  Administered 2019-02-20: 10:00:00 via INTRAVENOUS
  Filled 2019-02-20: qty 250

## 2019-02-20 MED ORDER — ALTEPLASE 2 MG IJ SOLR
2.0000 mg | Freq: Once | INTRAMUSCULAR | Status: DC | PRN
  Filled 2019-02-20: qty 2

## 2019-02-20 MED ORDER — HEPARIN SOD (PORK) LOCK FLUSH 100 UNIT/ML IV SOLN
500.0000 [IU] | Freq: Once | INTRAVENOUS | Status: AC | PRN
  Administered 2019-02-20: 500 [IU]
  Filled 2019-02-20: qty 5

## 2019-02-20 MED ORDER — HEPARIN SOD (PORK) LOCK FLUSH 100 UNIT/ML IV SOLN
250.0000 [IU] | Freq: Once | INTRAVENOUS | Status: DC | PRN
  Filled 2019-02-20: qty 5

## 2019-02-20 MED ORDER — SODIUM CHLORIDE 0.9 % IV SOLN: Freq: Once | INTRAVENOUS | Status: DC

## 2019-02-20 MED ORDER — PEGFILGRASTIM-CBQV 6 MG/0.6ML ~~LOC~~ SOSY
6.0000 mg | PREFILLED_SYRINGE | Freq: Once | SUBCUTANEOUS | Status: AC
  Administered 2019-02-20: 6 mg via SUBCUTANEOUS

## 2019-02-20 MED ORDER — SODIUM CHLORIDE 0.9% FLUSH
10.0000 mL | Freq: Once | INTRAVENOUS | Status: AC | PRN
  Administered 2019-02-20: 10 mL
  Filled 2019-02-20: qty 10

## 2019-02-20 MED ORDER — PEGFILGRASTIM-CBQV 6 MG/0.6ML ~~LOC~~ SOSY
PREFILLED_SYRINGE | SUBCUTANEOUS | Status: AC
  Filled 2019-02-20: qty 0.6

## 2019-02-20 MED ORDER — SODIUM CHLORIDE 0.9% FLUSH
3.0000 mL | Freq: Once | INTRAVENOUS | Status: AC | PRN
  Administered 2019-02-20: 3 mL
  Filled 2019-02-20: qty 10

## 2019-02-20 NOTE — Patient Instructions (Signed)

## 2019-03-10 NOTE — Progress Notes (Signed)
[place]   [phone] [phone]   Clinic Follow up Note   Patient Care Team: Deland Pretty, MD as PCP - General (Internal Medicine) [name], RN as Oncology Nurse Navigator [name], RN as Oncology Nurse Navigator [name], MD as Consulting Physician (General Surgery) [name], MD as Consulting Physician (Hematology) [name], MD as Attending Physician (Radiation Oncology) 03/11/2019  CHIEF COMPLAINT: f/u right breast cancer   SUMMARY OF ONCOLOGIC HISTORY: Oncology History  Malignant neoplasm of upper-outer quadrant of right female breast ([place])  12/07/2018 Mammogram   Mammogram 12/07/18  IMPRESSION: 1. 3 cm mass in the 11 o'clock position of the right breast, 4 cm the nipple, measuring 3 x 2 x 2 cm, highly suspicious for breast carcinoma. No enlarged or abnormal right axillary lymph nodes.   12/09/2018 Cancer Staging   Staging form: Breast, AJCC 8th Edition - Clinical stage from 12/09/2018: Stage IIA (cT2, cN0, cM0, G3, ER-, PR-, HER2+) - Signed by [name], MD on 12/15/2018   12/09/2018 Initial Biopsy   Diagnosis 12/09/18 Breast, right, needle core biopsy, 11 o'clock - INVASIVE DUCTAL CARCINOMA. - DUCTAL CARCINOMA IN SITU. - SEE COMMENT.   12/09/2018 Receptors her2   The tumor cells are POSITIVE for Her2 (3+). Estrogen Receptor: 0%, NEGATIVE Progesterone Receptor: 0%, NEGATIVE Proliferation Marker Ki67: 30%   12/15/2018 Initial Diagnosis   Malignant neoplasm of upper-outer quadrant of right female breast ([place])   12/21/2018 Breast MRI   IMPRESSION: 1. Approximate 4 cm mass involving the UPPER RIGHT breast with extension into the UPPER INNER QUADRANT and UPPER OUTER QUADRANT, predominantly located in the [place], biopsy-proven invasive ductal carcinoma and DCIS. The mass abuts the chest wall though I cannot confirm pectoralis muscle invasion. 2. Superficial satellite mass versus pathologic  intramammary lymph node in the UPPER OUTER QUADRANT of the RIGHT breast measuring approximately 1 cm, located LATERAL to the dominant mass. 3. No MRI evidence of malignancy involving the LEFT breast. 4. No pathologic lymphadenopathy otherwise.   12/24/2018 Pathology Results   Diagnosis Breast, right, needle core biopsy, upper outer quadrant, peripheral to the recently biopsy proven breast carcinoma - INVASIVE DUCTAL CARCINOMA. SEE COMMENT. Results: IMMUNOHISTOCHEMICAL AND MORPHOMETRIC ANALYSIS PERFORMED MANUALLY The tumor cells are POSITIVE for Her2 (3+). Estrogen Receptor: 0%, NEGATIVE Progesterone Receptor: 0%, NEGATIVE Proliferation Marker Ki67: 40%   01/06/2019 -  Chemotherapy   Neoadjuvant chemotherapy TCHP beginning 01/06/19      CURRENT THERAPY: Neoadjuvant chemotherapy TCHP beginning 01/06/19  INTERVAL HISTORY: Ms. Dromgoole returns for f/u and treatment as scheduled. She completed cycle 3 on 02/17/2019. Treatment is difficult for her. She feels tired, weak, wobbly, and short of breath with low appetite and "queasiness" for almost 2 weeks after treatment. IVF with injection helps some, but she thinks day 3 is too soon after. Has to force herself to eat, drink, and get out of bed. She does not take zofran because it made her "jittery" and is afraid to take zofran because of how she might feel. GERD is better. She has diarrhea during the first week after chemo, imodium helps. Denies mucositis. Denies fever, chills, chest pain, dyspnea, leg swelling, or neuropathy. She has dry cough occasionally. Bone pain with injection lasts 2 days. She can tell the mass is shrinking and is considering stopping treatment after cycle 5 due to side effects. She only has a week of feeling well before next cycle.   MEDICAL HISTORY:  Past Medical History:  Diagnosis Date  .  Allergy   . Anemia   . Asthma   . Cancer ([place])    Breast-R  . Chronic kidney disease   . Hyperlipidemia   . Hypertension   . Kidney  stone   . Mitral valve prolapse   . Osteoporosis     SURGICAL HISTORY: Past Surgical History:  Procedure Laterality Date  . BREAST BIOPSY Left 12/27/2015   benign  . BREAST BIOPSY Right 07/26/2013   benign  . BREAST EXCISIONAL BIOPSY Right 09/08/2013   high risk   . PORTACATH PLACEMENT N/A 12/28/2018   Procedure: INSERTION PORT-A-CATH;  Surgeon: [name], MD;  Location: [facility];  Service: General;  Laterality: N/A;  . SHOULDER SURGERY      I have reviewed the social history and family history with the patient and they are unchanged from previous note.  ALLERGIES:  has No Known Allergies.  MEDICATIONS:  Current Outpatient Medications  Medication Sig Dispense Refill  . atorvastatin (LIPITOR) 20 MG tablet Take 20 mg by mouth daily.     Marland Kitchen dexamethasone (DECADRON) 4 MG tablet Take 2 tablets (8 mg total) by mouth 2 (two) times daily. Start the day before Taxotere. Take once the day after, then 2 times a day x 2d. 30 tablet 1  . lidocaine-prilocaine (EMLA) cream Apply to affected area once 30 g 3  . pantoprazole (PROTONIX) 20 MG tablet TAKE 1 TABLET BY MOUTH EVERY DAY 30 tablet 1  . potassium chloride SA (K-DUR) 20 MEQ tablet Take 1 tablet (20 mEq total) by mouth daily. 30 tablet 1  . ondansetron (ZOFRAN) 8 MG tablet Take 1 tablet (8 mg total) by mouth 2 (two) times daily as needed (Nausea or vomiting). Begin 4 days after chemotherapy. (Patient not taking: Reported on 03/11/2019) 30 tablet 1  . oxyCODONE (OXY IR/ROXICODONE) 5 MG immediate release tablet Take 1 tablet (5 mg total) by mouth every 6 (six) hours as needed for severe pain. (Patient not taking: Reported on 03/11/2019) 8 tablet 0  . prochlorperazine (COMPAZINE) 10 MG tablet Take 1 tablet (10 mg total) by mouth every 6 (six) hours as needed (Nausea or vomiting). (Patient not taking: Reported on 03/11/2019) 30 tablet 1  . traZODone (DESYREL) 50 MG tablet Take 1 tablet (50 mg total) by mouth at bedtime. (Patient not taking: Reported  on 03/11/2019) 30 tablet 1   Current Facility-Administered Medications  Medication Dose Route Frequency Provider Last Rate Last Dose  . 0.9 %  sodium chloride infusion  500 mL Intravenous Once Nandigam, Venia Minks, MD       Facility-Administered Medications Ordered in Other Visits  Medication Dose Route Frequency Provider Last Rate Last Dose  . CARBOplatin (PARAPLATIN) 320 mg in sodium chloride 0.9 % 250 mL chemo infusion  320 mg Intravenous Once [name], MD      . DOCEtaxel (TAXOTERE) 90 mg in sodium chloride 0.9 % 250 mL chemo infusion  65 mg/m2 (Treatment Plan Recorded) Intravenous Once [name], MD      . heparin lock flush 100 unit/mL  500 Units Intracatheter Once PRN [name], MD      . pertuzumab (PERJETA) 420 mg in sodium chloride 0.9 % 250 mL chemo infusion  420 mg Intravenous Once [name], MD 528 mL/hr at 03/11/19 1124 420 mg at 03/11/19 1124  . sodium chloride flush (NS) 0.9 % injection 10 mL  10 mL Intracatheter PRN [name], MD        PHYSICAL EXAMINATION: ECOG PERFORMANCE STATUS: 2 - Symptomatic, <  50% confined to bed  Vitals:   03/11/19 0835  BP: 137/77  Pulse: 97  Resp: 18  Temp: 98.5 F (36.9 C)  SpO2: 97%   Filed Weights   03/11/19 0835  Weight: 101 lb (45.8 kg)    GENERAL:alert, no distress and comfortable SKIN: no rash  EYES: sclera clear LUNGS: clear anteriorly with normal breathing effort HEART: regular rate & rhythm, no lower extremity edema ABDOMEN:abdomen soft and normal bowel sounds Musculoskeletal:no cyanosis of digits NEURO: alert & oriented x 3 with fluent speech, normal gait Breast: RUO quadrant soft tissue thickening measures 2x1 cm, no other mass or axillary adenopathy PAC without erythema    LABORATORY DATA:  I have reviewed the data as listed CBC Latest Ref Rng & Units 03/11/2019 02/17/2019 01/27/2019  WBC 4.0 - 10.5 K/uL 16.1(H) 16.3(H) 16.7(H)  Hemoglobin 12.0 - 15.0 g/dL 11.6(L) 11.5(L) 11.7(L)  Hematocrit 36.0 - 46.0 % 35.4(L)  35.1(L) 35.5(L)  Platelets 150 - 400 K/uL 201 175 224     CMP Latest Ref Rng & Units 03/11/2019 02/17/2019 01/27/2019  Glucose 70 - 99 mg/dL 157(H) 225(H) 117(H)  BUN 8 - 23 mg/dL '18 15 16  '$ Creatinine 0.44 - 1.00 mg/dL 0.76 0.77 0.85  Sodium 135 - 145 mmol/L 141 140 138  Potassium 3.5 - 5.1 mmol/L 3.5 3.5 4.2  Chloride 98 - 111 mmol/L 104 107 104  CO2 22 - 32 mmol/L '26 23 26  '$ Calcium 8.9 - 10.3 mg/dL 9.6 9.4 9.4  Total Protein 6.5 - 8.1 g/dL 6.6 6.6 6.7  Total Bilirubin 0.3 - 1.2 mg/dL 0.4 0.3 0.4  Alkaline Phos 38 - 126 U/L 80 78 71  AST 15 - 41 U/L '26 24 25  '$ ALT 0 - 44 U/L 42 40 58(H)      RADIOGRAPHIC STUDIES: I have personally reviewed the radiological images as listed and agreed with the findings in the report. No results found.   ASSESSMENT & PLAN: Ileta Ofarrell Delisiis a 68 y.o.caucasianfemalewith a history of asthma as a child, fluctuating BP, osteoporosis   1.Malignant neoplasm of upper-outer quadrant of right, StageIIA, c(T2,N0,M0), ER/PR-, HER2+,Grade III; satellite lesion in R UOQ invasive ductal carcinoma ER/PR-, HER2+, Ki67 40% -Dr. Burr Medico previously reviewed her secondary biopsy, which shows satellite lesion in right UOQ that is positive for invasive ductal carcinoma; she has multi-focal disease with similar prognostic features. -baseline echo 60-65%; she was referred to cardiology on 01/06/19  -she began neoadjuvant chemotherapy with TCHP on 01/06/19; she tolerated cycle 1 poorly during first week with insomnia, constipation, poor appetite, mild weight loss, rash, and acid reflux. herceptin and perjeta reduced with cycle 2 -clinically, right breast mass is smaller on chemo, indicating good treatment response   2. Hyperglycemia -related to steroids  -BG 225 on 02/17/19  -improved with reduced dex, now takes 4 mg BID day before chemo and 4 mg daily for 3 days after -BG 157 today, improved -A1c pending today  3. Social and financial support  -she has friends, no  family -she has strong faith in [place]. Our chaplain has reached out to her -she is concerned about mounting medical bills and is interested in talking with financial advocate today. She has medicare. I referred her. Due to privacy issues, she prefers to be called at home.   Disposition:  Ms. Emory appears stable. She completed cycle 3 neoadjuvant TCHP. She has decreased performance status for 2 weeks following chemo, with poor appetite, fatigue, dyspnea on exertion, and nausea. She is able  to recover moderately well for a week before next chemo. I recommend to try 0.5 - 1 tab compazine PRN. She will continue imodium for diarrhea PRN. Continue reduced dex before and after treatment. I encouraged her to try to eat well, drink more, and increase activity after treatment.   Clinically, the right breast soft tissue thickening is smaller, indicating good response to treatment. Labs are adequate to continue. I recommend to proceed with cycle 4 with dose-reduced TC due to her decreased PS after chemo and prolonged recovery. Continue herceptin and perjeta at current doses. She agrees. She notes she will most likely stop treatment after cycle 5. She will discuss with Dr. Burr Medico at next f/u in 3 weeks, or sooner if needed. Plan was reviewed with Dr. Burr Medico who amended the orders, pharmacy, and infusion RN.  She is requesting to move IVF a few days out, she is concerned about mounting medical bills and needs it more during the week after treatment. Due to her concerns, I referred her to financial advocate and requested they call her at home. Will move IVF to 9/15 per patients request. She will return 9/10 for injection. She knows to call clinic sooner if she has new or worsening needs.   Orders Placed This Encounter  Procedures  . Ambulatory referral to Social Work    Referral Priority:   Routine    Referral Type:   Consultation    Referral Reason:   Specialty Services Required    Number of Visits Requested:   1    All questions were answered. The patient knows to call the clinic with any problems, questions or concerns. No barriers to learning was detected. I spent 20 minutes counseling the patient face to face. The total time spent in the appointment was 25 minutes and more than 50% was on counseling and review of test results     Alla Feeling, NP 03/11/19

## 2019-03-11 ENCOUNTER — Other Ambulatory Visit: Payer: Self-pay | Admitting: Hematology

## 2019-03-11 ENCOUNTER — Telehealth: Payer: Self-pay | Admitting: Nurse Practitioner

## 2019-03-11 ENCOUNTER — Inpatient Hospital Stay: Payer: Medicare HMO

## 2019-03-11 ENCOUNTER — Encounter: Payer: Self-pay | Admitting: Nurse Practitioner

## 2019-03-11 ENCOUNTER — Inpatient Hospital Stay (HOSPITAL_BASED_OUTPATIENT_CLINIC_OR_DEPARTMENT_OTHER): Payer: Medicare HMO | Admitting: Nurse Practitioner

## 2019-03-11 ENCOUNTER — Inpatient Hospital Stay: Payer: Medicare HMO | Attending: Hematology

## 2019-03-11 ENCOUNTER — Other Ambulatory Visit: Payer: Self-pay

## 2019-03-11 VITALS — BP 137/77 | HR 97 | Temp 98.5°F | Resp 18 | Ht 63.0 in | Wt 101.0 lb

## 2019-03-11 DIAGNOSIS — C50411 Malignant neoplasm of upper-outer quadrant of right female breast: Secondary | ICD-10-CM | POA: Diagnosis not present

## 2019-03-11 DIAGNOSIS — N189 Chronic kidney disease, unspecified: Secondary | ICD-10-CM | POA: Diagnosis not present

## 2019-03-11 DIAGNOSIS — K5900 Constipation, unspecified: Secondary | ICD-10-CM | POA: Diagnosis not present

## 2019-03-11 DIAGNOSIS — K219 Gastro-esophageal reflux disease without esophagitis: Secondary | ICD-10-CM | POA: Insufficient documentation

## 2019-03-11 DIAGNOSIS — Z171 Estrogen receptor negative status [ER-]: Secondary | ICD-10-CM

## 2019-03-11 DIAGNOSIS — I129 Hypertensive chronic kidney disease with stage 1 through stage 4 chronic kidney disease, or unspecified chronic kidney disease: Secondary | ICD-10-CM | POA: Insufficient documentation

## 2019-03-11 DIAGNOSIS — C50211 Malignant neoplasm of upper-inner quadrant of right female breast: Secondary | ICD-10-CM | POA: Insufficient documentation

## 2019-03-11 DIAGNOSIS — G4700 Insomnia, unspecified: Secondary | ICD-10-CM | POA: Insufficient documentation

## 2019-03-11 DIAGNOSIS — Z5111 Encounter for antineoplastic chemotherapy: Secondary | ICD-10-CM | POA: Insufficient documentation

## 2019-03-11 DIAGNOSIS — Z79899 Other long term (current) drug therapy: Secondary | ICD-10-CM | POA: Insufficient documentation

## 2019-03-11 DIAGNOSIS — R739 Hyperglycemia, unspecified: Secondary | ICD-10-CM

## 2019-03-11 DIAGNOSIS — Z95828 Presence of other vascular implants and grafts: Secondary | ICD-10-CM

## 2019-03-11 LAB — CBC WITH DIFFERENTIAL (CANCER CENTER ONLY)
Abs Immature Granulocytes: 0.16 10*3/uL — ABNORMAL HIGH (ref 0.00–0.07)
Basophils Absolute: 0 10*3/uL (ref 0.0–0.1)
Basophils Relative: 0 %
Eosinophils Absolute: 0 10*3/uL (ref 0.0–0.5)
Eosinophils Relative: 0 %
HCT: 35.4 % — ABNORMAL LOW (ref 36.0–46.0)
Hemoglobin: 11.6 g/dL — ABNORMAL LOW (ref 12.0–15.0)
Immature Granulocytes: 1 %
Lymphocytes Relative: 5 %
Lymphs Abs: 0.8 10*3/uL (ref 0.7–4.0)
MCH: 31.1 pg (ref 26.0–34.0)
MCHC: 32.8 g/dL (ref 30.0–36.0)
MCV: 94.9 fL (ref 80.0–100.0)
Monocytes Absolute: 0.4 10*3/uL (ref 0.1–1.0)
Monocytes Relative: 3 %
Neutro Abs: 14.7 10*3/uL — ABNORMAL HIGH (ref 1.7–7.7)
Neutrophils Relative %: 91 %
Platelet Count: 201 10*3/uL (ref 150–400)
RBC: 3.73 MIL/uL — ABNORMAL LOW (ref 3.87–5.11)
RDW: 15.9 % — ABNORMAL HIGH (ref 11.5–15.5)
WBC Count: 16.1 10*3/uL — ABNORMAL HIGH (ref 4.0–10.5)
nRBC: 0 % (ref 0.0–0.2)

## 2019-03-11 LAB — CMP (CANCER CENTER ONLY)
ALT: 42 U/L (ref 0–44)
AST: 26 U/L (ref 15–41)
Albumin: 4.2 g/dL (ref 3.5–5.0)
Alkaline Phosphatase: 80 U/L (ref 38–126)
Anion gap: 11 (ref 5–15)
BUN: 18 mg/dL (ref 8–23)
CO2: 26 mmol/L (ref 22–32)
Calcium: 9.6 mg/dL (ref 8.9–10.3)
Chloride: 104 mmol/L (ref 98–111)
Creatinine: 0.76 mg/dL (ref 0.44–1.00)
GFR, Est AFR Am: >60 mL/min (ref >60)
GFR, Estimated: >60 mL/min (ref >60)
Glucose, Bld: 157 mg/dL — ABNORMAL HIGH (ref 70–99)
Potassium: 3.5 mmol/L (ref 3.5–5.1)
Sodium: 141 mmol/L (ref 135–145)
Total Bilirubin: 0.4 mg/dL (ref 0.3–1.2)
Total Protein: 6.6 g/dL (ref 6.5–8.1)

## 2019-03-11 MED ORDER — SODIUM CHLORIDE 0.9 % IV SOLN
65.0000 mg/m2 | Freq: Once | INTRAVENOUS | Status: AC
  Administered 2019-03-11: 90 mg via INTRAVENOUS
  Filled 2019-03-11: qty 9

## 2019-03-11 MED ORDER — SODIUM CHLORIDE 0.9% FLUSH
10.0000 mL | INTRAVENOUS | Status: DC | PRN
  Administered 2019-03-11: 10 mL
  Filled 2019-03-11: qty 10

## 2019-03-11 MED ORDER — TRASTUZUMAB-ANNS CHEMO 420 MG IV SOLR
6.0000 mg/kg | Freq: Once | INTRAVENOUS | Status: AC
  Administered 2019-03-11: 273 mg via INTRAVENOUS
  Filled 2019-03-11: qty 13

## 2019-03-11 MED ORDER — DIPHENHYDRAMINE HCL 25 MG PO CAPS
ORAL_CAPSULE | ORAL | Status: AC
  Filled 2019-03-11: qty 2

## 2019-03-11 MED ORDER — ACETAMINOPHEN 325 MG PO TABS
ORAL_TABLET | ORAL | Status: AC
  Filled 2019-03-11: qty 2

## 2019-03-11 MED ORDER — SODIUM CHLORIDE 0.9 % IV SOLN
Freq: Once | INTRAVENOUS | Status: AC
  Administered 2019-03-11: 10:00:00 via INTRAVENOUS
  Filled 2019-03-11: qty 5

## 2019-03-11 MED ORDER — HEPARIN SOD (PORK) LOCK FLUSH 100 UNIT/ML IV SOLN
500.0000 [IU] | Freq: Once | INTRAVENOUS | Status: AC | PRN
  Administered 2019-03-11: 500 [IU]
  Filled 2019-03-11: qty 5

## 2019-03-11 MED ORDER — SODIUM CHLORIDE 0.9 % IV SOLN
420.0000 mg | Freq: Once | INTRAVENOUS | Status: AC
  Administered 2019-03-11: 420 mg via INTRAVENOUS
  Filled 2019-03-11: qty 14

## 2019-03-11 MED ORDER — ACETAMINOPHEN 325 MG PO TABS
650.0000 mg | ORAL_TABLET | Freq: Once | ORAL | Status: AC
  Administered 2019-03-11: 650 mg via ORAL

## 2019-03-11 MED ORDER — SODIUM CHLORIDE 0.9 % IV SOLN
Freq: Once | INTRAVENOUS | Status: AC
  Administered 2019-03-11: 09:00:00 via INTRAVENOUS
  Filled 2019-03-11: qty 250

## 2019-03-11 MED ORDER — SODIUM CHLORIDE 0.9% FLUSH
10.0000 mL | Freq: Once | INTRAVENOUS | Status: AC
  Administered 2019-03-11: 10 mL
  Filled 2019-03-11: qty 10

## 2019-03-11 MED ORDER — PALONOSETRON HCL INJECTION 0.25 MG/5ML
INTRAVENOUS | Status: AC
  Filled 2019-03-11: qty 5

## 2019-03-11 MED ORDER — DIPHENHYDRAMINE HCL 25 MG PO CAPS
50.0000 mg | ORAL_CAPSULE | Freq: Once | ORAL | Status: AC
  Administered 2019-03-11: 09:00:00 50 mg via ORAL

## 2019-03-11 MED ORDER — PALONOSETRON HCL INJECTION 0.25 MG/5ML
0.2500 mg | Freq: Once | INTRAVENOUS | Status: AC
  Administered 2019-03-11: 0.25 mg via INTRAVENOUS

## 2019-03-11 MED ORDER — SODIUM CHLORIDE 0.9 % IV SOLN
320.0000 mg | Freq: Once | INTRAVENOUS | Status: AC
  Administered 2019-03-11: 320 mg via INTRAVENOUS
  Filled 2019-03-11: qty 32

## 2019-03-11 NOTE — Progress Notes (Signed)
Dose reduce Taxotere 65mg /m2 and Carbo AUC = 5 d/t poor tolerance per Dr Burr Medico

## 2019-03-11 NOTE — Patient Instructions (Signed)
[facility] Discharge Instructions for Patients Receiving Chemotherapy  Today you received the following chemotherapy agents Trastuzumab-anns (KANJINTI), Pertuzumab (PERJETA), Docetaxel (TAXOTERE) & Carboplatin (PARAPLATIN).  To help prevent nausea and vomiting after your treatment, we encourage you to take your nausea medication as prescribed.   If you develop nausea and vomiting that is not controlled by your nausea medication, call the clinic.   BELOW ARE SYMPTOMS THAT SHOULD BE REPORTED IMMEDIATELY:  *FEVER GREATER THAN 100.5 F  *CHILLS WITH OR WITHOUT FEVER  NAUSEA AND VOMITING THAT IS NOT CONTROLLED WITH YOUR NAUSEA MEDICATION  *UNUSUAL SHORTNESS OF BREATH  *UNUSUAL BRUISING OR BLEEDING  TENDERNESS IN MOUTH AND THROAT WITH OR WITHOUT PRESENCE OF ULCERS  *URINARY PROBLEMS  *BOWEL PROBLEMS  UNUSUAL RASH Items with * indicate a potential emergency and should be followed up as soon as possible.  Feel free to call the clinic should you have any questions or concerns. The clinic phone number is [phone].  Please show the CHEMO ALERT CARD at check-in to the Emergency Department and triage nurse.  Coronavirus (COVID-19) Are you at risk?  Are you at risk for the Coronavirus (COVID-19)?  To be considered HIGH RISK for Coronavirus (COVID-19), you have to meet the following criteria:  . Traveled to [place], [place], [place], [place] or [place]; or in the [place] to [place], [place], [place], or [place]; and have fever, cough, and shortness of breath within the last 2 weeks of travel OR . Been in close contact with a person diagnosed with COVID-19 within the last 2 weeks and have fever, cough, and shortness of breath . IF YOU DO NOT MEET THESE CRITERIA, YOU ARE CONSIDERED LOW RISK FOR COVID-19.  What to do if you are HIGH RISK for COVID-19?  . If you are having a medical emergency, call 911. . Seek medical care right away. Before you go to a  doctor's office, urgent care or emergency department, call ahead and tell them about your recent travel, contact with someone diagnosed with COVID-19, and your symptoms. You should receive instructions from your physician's office regarding next steps of care.  . When you arrive at healthcare provider, tell the healthcare staff immediately you have returned from visiting [place], [place], [place], [place] or [place]; or traveled in the [place] to [place], [place], [place], or [place]; in the last two weeks or you have been in close contact with a person diagnosed with COVID-19 in the last 2 weeks.   . Tell the health care staff about your symptoms: fever, cough and shortness of breath. . After you have been seen by a medical provider, you will be either: o Tested for (COVID-19) and discharged home on quarantine except to seek medical care if symptoms worsen, and asked to  - Stay home and avoid contact with others until you get your results (4-5 days)  - Avoid travel on public transportation if possible (such as bus, train, or airplane) or o Sent to the Emergency Department by EMS for evaluation, COVID-19 testing, and possible admission depending on your condition and test results.  What to do if you are LOW RISK for COVID-19?  Reduce your risk of any infection by using the same precautions used for avoiding the common cold or flu:  . Wash your hands often with soap and warm water for at least 20 seconds.  If soap and water are not readily available, use an alcohol-based hand sanitizer with at least   60% alcohol.  . If coughing or sneezing, cover your mouth and nose by coughing or sneezing into the elbow areas of your shirt or coat, into a tissue or into your sleeve (not your hands). . Avoid shaking hands with others and consider head nods or verbal greetings only. . Avoid touching your eyes, nose, or mouth with unwashed hands.  . Avoid close contact with people who are sick. . Avoid  places or events with large numbers of people in one location, like concerts or sporting events. . Carefully consider travel plans you have or are making. . If you are planning any travel outside or inside the [place], visit the CDC's Travelers' Health webpage for the latest health notices. . If you have some symptoms but not all symptoms, continue to monitor at home and seek medical attention if your symptoms worsen. . If you are having a medical emergency, call 911.   ADDITIONAL HEALTHCARE OPTIONS FOR PATIENTS  [facility] / e-Visit: [email or web address]         [facility]: [phone]  [facility]: [phone]                   [facility]: [phone]    

## 2019-03-11 NOTE — Telephone Encounter (Signed)
Scheduled appt per 9/10 los. Appt for 9/12 did not change

## 2019-03-12 LAB — HEMOGLOBIN A1C
Hgb A1c MFr Bld: 5.6 % (ref 4.8–5.6)
Mean Plasma Glucose: 114 mg/dL

## 2019-03-13 ENCOUNTER — Other Ambulatory Visit: Payer: Self-pay

## 2019-03-13 ENCOUNTER — Inpatient Hospital Stay: Payer: Medicare HMO

## 2019-03-13 VITALS — BP 131/64 | HR 82 | Temp 99.1°F | Resp 16

## 2019-03-13 DIAGNOSIS — C50211 Malignant neoplasm of upper-inner quadrant of right female breast: Secondary | ICD-10-CM | POA: Diagnosis not present

## 2019-03-13 DIAGNOSIS — Z171 Estrogen receptor negative status [ER-]: Secondary | ICD-10-CM | POA: Diagnosis not present

## 2019-03-13 DIAGNOSIS — Z5111 Encounter for antineoplastic chemotherapy: Secondary | ICD-10-CM | POA: Diagnosis not present

## 2019-03-13 DIAGNOSIS — N189 Chronic kidney disease, unspecified: Secondary | ICD-10-CM | POA: Diagnosis not present

## 2019-03-13 DIAGNOSIS — Z79899 Other long term (current) drug therapy: Secondary | ICD-10-CM | POA: Diagnosis not present

## 2019-03-13 DIAGNOSIS — K219 Gastro-esophageal reflux disease without esophagitis: Secondary | ICD-10-CM | POA: Diagnosis not present

## 2019-03-13 DIAGNOSIS — G4700 Insomnia, unspecified: Secondary | ICD-10-CM | POA: Diagnosis not present

## 2019-03-13 DIAGNOSIS — C50411 Malignant neoplasm of upper-outer quadrant of right female breast: Secondary | ICD-10-CM | POA: Diagnosis not present

## 2019-03-13 DIAGNOSIS — I129 Hypertensive chronic kidney disease with stage 1 through stage 4 chronic kidney disease, or unspecified chronic kidney disease: Secondary | ICD-10-CM | POA: Diagnosis not present

## 2019-03-13 DIAGNOSIS — K5900 Constipation, unspecified: Secondary | ICD-10-CM | POA: Diagnosis not present

## 2019-03-13 MED ORDER — PEGFILGRASTIM-CBQV 6 MG/0.6ML ~~LOC~~ SOSY
6.0000 mg | PREFILLED_SYRINGE | Freq: Once | SUBCUTANEOUS | Status: AC
  Administered 2019-03-13: 6 mg via SUBCUTANEOUS

## 2019-03-13 NOTE — Patient Instructions (Signed)

## 2019-03-16 ENCOUNTER — Inpatient Hospital Stay: Payer: Medicare HMO

## 2019-03-16 ENCOUNTER — Other Ambulatory Visit: Payer: Self-pay

## 2019-03-16 ENCOUNTER — Encounter: Payer: Self-pay | Admitting: *Deleted

## 2019-03-16 VITALS — BP 117/72 | HR 108 | Temp 98.2°F | Resp 18

## 2019-03-16 DIAGNOSIS — Z95828 Presence of other vascular implants and grafts: Secondary | ICD-10-CM

## 2019-03-16 DIAGNOSIS — N189 Chronic kidney disease, unspecified: Secondary | ICD-10-CM | POA: Diagnosis not present

## 2019-03-16 DIAGNOSIS — C50411 Malignant neoplasm of upper-outer quadrant of right female breast: Secondary | ICD-10-CM

## 2019-03-16 DIAGNOSIS — Z5111 Encounter for antineoplastic chemotherapy: Secondary | ICD-10-CM | POA: Diagnosis not present

## 2019-03-16 DIAGNOSIS — Z79899 Other long term (current) drug therapy: Secondary | ICD-10-CM | POA: Diagnosis not present

## 2019-03-16 DIAGNOSIS — G4700 Insomnia, unspecified: Secondary | ICD-10-CM | POA: Diagnosis not present

## 2019-03-16 DIAGNOSIS — I129 Hypertensive chronic kidney disease with stage 1 through stage 4 chronic kidney disease, or unspecified chronic kidney disease: Secondary | ICD-10-CM | POA: Diagnosis not present

## 2019-03-16 DIAGNOSIS — K5900 Constipation, unspecified: Secondary | ICD-10-CM | POA: Diagnosis not present

## 2019-03-16 DIAGNOSIS — Z171 Estrogen receptor negative status [ER-]: Secondary | ICD-10-CM | POA: Diagnosis not present

## 2019-03-16 DIAGNOSIS — K219 Gastro-esophageal reflux disease without esophagitis: Secondary | ICD-10-CM | POA: Diagnosis not present

## 2019-03-16 DIAGNOSIS — C50211 Malignant neoplasm of upper-inner quadrant of right female breast: Secondary | ICD-10-CM | POA: Diagnosis not present

## 2019-03-16 MED ORDER — SODIUM CHLORIDE 0.9% FLUSH
10.0000 mL | Freq: Once | INTRAVENOUS | Status: AC | PRN
  Administered 2019-03-16: 11:00:00 10 mL
  Filled 2019-03-16: qty 10

## 2019-03-16 MED ORDER — SODIUM CHLORIDE 0.9 % IV SOLN
Freq: Once | INTRAVENOUS | Status: AC
  Administered 2019-03-16: 09:00:00 via INTRAVENOUS
  Filled 2019-03-16: qty 250

## 2019-03-16 MED ORDER — HEPARIN SOD (PORK) LOCK FLUSH 100 UNIT/ML IV SOLN
500.0000 [IU] | Freq: Once | INTRAVENOUS | Status: AC | PRN
  Administered 2019-03-16: 500 [IU]
  Filled 2019-03-16: qty 5

## 2019-03-16 NOTE — Progress Notes (Signed)
[place] Work  Clinical Social Work received referral from medical oncology for financial concerns.  CSW contacted patient at home to offer support and assess for needs.  Patient stated she lives alone and is the only source of income.  Patient stated she was experiencing increase stress due to medical bills/expenses.  CSW and patient discussed financial resources, and CSW encouraged patient to contact [place] financial advocate to discuss [employer].  CSW provided patient with information on Cancer Care and phone number to call to initiate the application process.  CSW also mailed patient an application for [employer] [place].  Patient plans to see CSW at her next appointment to complete enrollment in the [employer] fund program.  CSW provided contact information and encouraged patient to call with questions or concerns.   Johnnye Lana, MSW, LCSW, OSW-C Clinical Social Worker [facility] [phone]

## 2019-03-16 NOTE — Patient Instructions (Signed)

## 2019-03-17 ENCOUNTER — Encounter: Payer: Self-pay | Admitting: Hematology

## 2019-03-17 ENCOUNTER — Encounter: Payer: Self-pay | Admitting: General Practice

## 2019-03-17 NOTE — Progress Notes (Signed)
[facility] CSW Progress Notes  Call from patient, wants help w obtaining Cancer Care application as she does not have ability to print out the email she received from agency.  Provided patient w my email so I can print and mail to her.  Also mailed her copies of [employer] [facility] [place] and [employer] applications.  CSW [place] asked to meet w patient to complete/submit these applications at her next [facility] visit on Tuesday 9/29.  Staff message sent.  Edwyna Shell, LCSW Clinical Social Worker Phone:  [phone]

## 2019-03-17 NOTE — Progress Notes (Signed)
Received call from patient to discuss financial concerns.  Asked patient about insurance ded/OOP and she wasn't sure what the amounts were and if she had satisfied them. Advised patient to review EOB if received and contact insurance if not to have a clear understanding of expected OOP before insurance will pick up copay. Patient was able to locate her EOB and provide that information to me.  Reviewed treatment plan and due to programs and patient insurance type, she does not qualify for copay assistance. There are currently no other foundations that have assistance available for her diagnosis. Advised patient I would send message to Rob in pharmacy to research whether or not she may qualify for assistance through Drug Assistance due to excessive OOP. Sent Rob Product/process development scientist.  Asked patient if she had ever applied for Medicaid as a secondary. She states she has not. Advised patient this is an option and I can provide an application to her at her next visit for her to turn into DSS or she may apply online. She states she would try.  Discussed one-time $1000 Radio broadcast assistant to assist with personal non-medical bill related expenses while going through treatment. Based one verbal income provided, she will qualify. Advised patient to bring proof of income on 0000000 to complete application. She verbalized understanding.  She has my contact name and number for any additional financial questions or concerns.

## 2019-03-26 ENCOUNTER — Encounter: Payer: Self-pay | Admitting: *Deleted

## 2019-03-26 NOTE — Progress Notes (Signed)
[place]   Telephone:(336) [phone] Fax:(336) [phone]   Clinic Follow up Note   Patient Care Team: Deland Pretty, MD as PCP - General (Internal Medicine) [name], RN as Oncology Nurse Navigator [name], RN as Oncology Nurse Navigator [name], MD as Consulting Physician (General Surgery) [name], MD as Consulting Physician (Hematology) [name], MD as Attending Physician (Radiation Oncology)  Date of Service:  03/30/2019  CHIEF COMPLAINT: F/u of right breast cancer  SUMMARY OF ONCOLOGIC HISTORY: Oncology History  Malignant neoplasm of upper-outer quadrant of right female breast ([place])  12/07/2018 Mammogram   Mammogram 12/07/18  IMPRESSION: 1. 3 cm mass in the 11 o'clock position of the right breast, 4 cm the nipple, measuring 3 x 2 x 2 cm, highly suspicious for breast carcinoma. No enlarged or abnormal right axillary lymph nodes.   12/09/2018 Cancer Staging   Staging form: Breast, AJCC 8th Edition - Clinical stage from 12/09/2018: Stage IIA (cT2, cN0, cM0, G3, ER-, PR-, HER2+) - Signed by [name], MD on 12/15/2018   12/09/2018 Initial Biopsy   Diagnosis 12/09/18 Breast, right, needle core biopsy, 11 o'clock - INVASIVE DUCTAL CARCINOMA. - DUCTAL CARCINOMA IN SITU. - SEE COMMENT.   12/09/2018 Receptors her2   The tumor cells are POSITIVE for Her2 (3+). Estrogen Receptor: 0%, NEGATIVE Progesterone Receptor: 0%, NEGATIVE Proliferation Marker Ki67: 30%   12/15/2018 Initial Diagnosis   Malignant neoplasm of upper-outer quadrant of right female breast ([place])   12/21/2018 Breast MRI   IMPRESSION: 1. Approximate 4 cm mass involving the UPPER RIGHT breast with extension into the UPPER INNER QUADRANT and UPPER OUTER QUADRANT, predominantly located in the [place], biopsy-proven invasive ductal carcinoma and DCIS. The mass abuts the chest wall though I cannot confirm pectoralis muscle invasion. 2. Superficial satellite mass versus  pathologic intramammary lymph node in the UPPER OUTER QUADRANT of the RIGHT breast measuring approximately 1 cm, located LATERAL to the dominant mass. 3. No MRI evidence of malignancy involving the LEFT breast. 4. No pathologic lymphadenopathy otherwise.   12/24/2018 Pathology Results   Diagnosis Breast, right, needle core biopsy, upper outer quadrant, peripheral to the recently biopsy proven breast carcinoma - INVASIVE DUCTAL CARCINOMA. SEE COMMENT. Results: IMMUNOHISTOCHEMICAL AND MORPHOMETRIC ANALYSIS PERFORMED MANUALLY The tumor cells are POSITIVE for Her2 (3+). Estrogen Receptor: 0%, NEGATIVE Progesterone Receptor: 0%, NEGATIVE Proliferation Marker Ki67: 40%   01/06/2019 -  Chemotherapy   Neoadjuvant chemotherapy TCHP beginning 01/06/19 completed 5 cycle of TCHP on 03/30/19. -Started Herceptin and Perjeta maintanence therapy on 04/21/19      CURRENT THERAPY:  Neoadjuvant chemotherapy TCHP beginning 01/06/19  INTERVAL HISTORY:  Christina Sexton is here for a follow up and treatment. She presents to the clinic alone. She notes the chemo is getting harder on her. She is now s/p C4. It is hard for her to eat and eats mostly liquid. She currently eats very little and has lost more weight. She has been having diarrhea at least twice a day. She has tried imodium which has helped some. She has not been taking everyday. Her nausea has worsened. She has been taking compazine. She does not tolerate Zofran. She takes '8mg'$  Dexa the day before chemo and '4mg'$  for 3 days after chemo. She has been taking Ensure supplements. She notes she has not seen our dietician recently. She notes her fatigue improved on her off weeks. She denies fever or chills. She notes mild tinging on her fingertips. She feels her hearing  has gotten worse with chemo. She notes turning up her volume now. She only had mild issues with her hearing before hand. Her C4 was dose reduces but the change was not significantly noticeable.  She  notes she barely feels her breast mass currently. Given her concerns with side effects, she would like her 5th cycle to be her last before proceeding with surgery.    REVIEW OF SYSTEMS:   Constitutional: Denies fevers, chills (+) Low food intake, Weight loss  Eyes: Denies blurriness of vision Ears, nose, mouth, throat, and face: Denies mucositis or sore throat (+) Decrease in hearing  Respiratory: Denies cough, dyspnea or wheezes Cardiovascular: Denies palpitation, chest discomfort or lower extremity swelling Gastrointestinal:  Denies heartburn (+) Diarrhea (+) nausea  Skin: Denies abnormal skin rashes Lymphatics: Denies new lymphadenopathy or easy bruising Neurological:Denies numbness, tingling or new weaknesses Behavioral/Psych: Mood is stable, no new changes  All other systems were reviewed with the patient and are negative.  MEDICAL HISTORY:  Past Medical History:  Diagnosis Date   Allergy    Anemia    Asthma    Cancer ([place])    Breast-R   Chronic kidney disease    Hyperlipidemia    Hypertension    Kidney stone    Mitral valve prolapse    Osteoporosis     SURGICAL HISTORY: Past Surgical History:  Procedure Laterality Date   BREAST BIOPSY Left 12/27/2015   benign   BREAST BIOPSY Right 07/26/2013   benign   BREAST EXCISIONAL BIOPSY Right 09/08/2013   high risk    PORTACATH PLACEMENT N/A 12/28/2018   Procedure: INSERTION PORT-A-CATH;  Surgeon: [name], MD;  Location: [facility];  Service: General;  Laterality: N/A;   SHOULDER SURGERY      I have reviewed the social history and family history with the patient and they are unchanged from previous note.  ALLERGIES:  has No Known Allergies.  MEDICATIONS:  Current Outpatient Medications  Medication Sig Dispense Refill   atorvastatin (LIPITOR) 20 MG tablet Take 20 mg by mouth daily.      dexamethasone (DECADRON) 4 MG tablet Take 2 tablets (8 mg total) by mouth 2 (two) times daily. Start the day before  Taxotere. Take once the day after, then 2 times a day x 2d. 30 tablet 1   lidocaine-prilocaine (EMLA) cream Apply to affected area once 30 g 3   pantoprazole (PROTONIX) 20 MG tablet TAKE 1 TABLET BY MOUTH EVERY DAY 30 tablet 1   potassium chloride SA (K-DUR) 20 MEQ tablet Take 1 tablet (20 mEq total) by mouth daily. 30 tablet 1   prochlorperazine (COMPAZINE) 10 MG tablet Take 1 tablet (10 mg total) by mouth every 6 (six) hours as needed (Nausea or vomiting). 30 tablet 1   ondansetron (ZOFRAN) 8 MG tablet Take 1 tablet (8 mg total) by mouth 2 (two) times daily as needed (Nausea or vomiting). Begin 4 days after chemotherapy. (Patient not taking: Reported on 03/11/2019) 30 tablet 1   oxyCODONE (OXY IR/ROXICODONE) 5 MG immediate release tablet Take 1 tablet (5 mg total) by mouth every 6 (six) hours as needed for severe pain. (Patient not taking: Reported on 03/11/2019) 8 tablet 0   traZODone (DESYREL) 50 MG tablet Take 1 tablet (50 mg total) by mouth at bedtime. (Patient not taking: Reported on 03/11/2019) 30 tablet 1   Current Facility-Administered Medications  Medication Dose Route Frequency Provider Last Rate Last Dose   0.9 %  sodium chloride infusion  500 mL Intravenous  Once Mauri Pole, MD        PHYSICAL EXAMINATION: ECOG PERFORMANCE STATUS: 2 - Symptomatic, <50% confined to bed  Vitals:   03/30/19 0927  BP: (!) 142/67  Pulse: 97  Resp: 17  Temp: 98.6 F (37 C)  SpO2: 98%   Filed Weights   03/30/19 0927  Weight: 99 lb 2 oz (45 kg)    GENERAL:alert, no distress and comfortable SKIN: skin color, texture, turgor are normal, no rashes or significant lesions EYES: normal, Conjunctiva are pink and non-injected, sclera clear  NECK: supple, thyroid normal size, non-tender, without nodularity LYMPH:  no palpable lymphadenopathy in the cervical, axillary  LUNGS: clear to auscultation and percussion with normal breathing effort HEART: regular rate & rhythm and no murmurs and  no lower extremity edema ABDOMEN:abdomen soft, non-tender and normal bowel sounds Musculoskeletal:no cyanosis of digits and no clubbing  NEURO: alert & oriented x 3 with fluent speech, no focal motor/sensory deficits BREAST:(+) soft tissue fullness smaller, now 2x1.5cm in RUO quadrant of right breast. Exam of left breast and b/l axilla were normal   LABORATORY DATA:  I have reviewed the data as listed CBC Latest Ref Rng & Units 03/30/2019 03/11/2019 02/17/2019  WBC 4.0 - 10.5 K/uL 19.7(H) 16.1(H) 16.3(H)  Hemoglobin 12.0 - 15.0 g/dL 11.0(L) 11.6(L) 11.5(L)  Hematocrit 36.0 - 46.0 % 33.7(L) 35.4(L) 35.1(L)  Platelets 150 - 400 K/uL 241 201 175     CMP Latest Ref Rng & Units 03/30/2019 03/11/2019 02/17/2019  Glucose 70 - 99 mg/dL 226(H) 157(H) 225(H)  BUN 8 - 23 mg/dL '15 18 15  '$ Creatinine 0.44 - 1.00 mg/dL 0.73 0.76 0.77  Sodium 135 - 145 mmol/L 140 141 140  Potassium 3.5 - 5.1 mmol/L 3.1(L) 3.5 3.5  Chloride 98 - 111 mmol/L 103 104 107  CO2 22 - 32 mmol/L '25 26 23  '$ Calcium 8.9 - 10.3 mg/dL 9.6 9.6 9.4  Total Protein 6.5 - 8.1 g/dL 6.5 6.6 6.6  Total Bilirubin 0.3 - 1.2 mg/dL 0.2(L) 0.4 0.3  Alkaline Phos 38 - 126 U/L 87 80 78  AST 15 - 41 U/L '21 26 24  '$ ALT 0 - 44 U/L 29 42 40      RADIOGRAPHIC STUDIES: I have personally reviewed the radiological images as listed and agreed with the findings in the report. No results found.   ASSESSMENT & PLAN:  Christina Sexton is a 68 y.o. 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 -Breast MRI shows multifocal disease of right breast. Her 6/25/20secondary biopsy shows satellite lesion in right UOQ that is positive for invasive ductal carcinoma; with similar molecular features. -I started her onneoadjuvantTCHP q3weeks for 6 cycleson 7/8/20followed by maintenance H/P to complete 1 year of  treatment. [place] proceed with surgery after neoadjuvant chemo followed by RT with Dr. Isidore Moos.  -She has baseline ECHOwas60-65% and PACwasplaced.  -With each chemo cycle she her side effects significantly accumulate. S/p C4 she has weight loss, mild to moderate fatigue, moderate diarrhea, worsening nausea and decrease in hearing. We discussed the standard treatment is 6 cycles but given her poor tolerance will stop after 5th cycle.  -Will proceed with Breast MRI to evaluate her response to chemo in 1-2 weeks before seeing Dr. Barry Dienes about surgery.  -Will likely proceed with surgery in 1-2 months. In the meantime she will start her Herceptin/Perjeta maintenance therapy q3weeks.  -  I briefly discussed if her surgery shows significant residual cancer then I will recommend adjuvant TDM-1 ([place]) Will decide after her surgery.  -Also after surgery we briefly reviewed proceeding with radiation to reduce her risk of local cancer recurrence.  -Will repeat ECHO in 1-3 weeks  -Her Physical exam shows soft tissue fullness 2x1.5cm in RUQ right breast. Labs reviewed, CBC and CMP WNL except WBC 19.7, Hg 11, ANC 18.1, K 3.1, BG 226. Overall adequate to proceed with C5 TCHP with dose reduction.  -F/u in 3 weeks to start maintenance Herceptin and Perjeta.   2. Acid Reflux, Nausea, Constipation/Diarrhea, Insomnia, low appetite, supportive care   -Secondary to chemo -For acid reflux she has Protonix.  -For her Insomnia she is to try OTC Melatonin first. If not helping she has Trazodone.  -I encouraged her to continue to f/u with our dietician -I encouraged her to continue nutritional supplements such as Ensure.  -For her nausea she does not tolerate Zofran, but will continue compazine. I encouraged her to continue Dexa '8mg'$  day before infusion and then '4mg'$  for 3-5 days after infusion.  -For her diarrhea she will continue and increase imodium use to daily if needed. She declined lomotil to avoid constipation.    -For constipation she will continue stool softener or OTC Miralax.  -Will continue IVFs with her injection on day 3.   3. Social and financial support  -She is single, lives alone and has no children. She lives 1-2 miles from cancer center.  -She notes she has friends but overall not much family support. -she is so far able to do all self-care -she is concerned about mounting medical bills and is interested in talking with financial advocate today. She has medicare. I referred her. Due to privacy issues, she prefers to be called at home.   4. Hyperglycemia  -Secondary to steroids.  -Given hyperglycemia, will reduce her Dexa to '4mg'$  BID the day before infusion and '4mg'$  once daily for 3 days after infusion. I also recommend she reduce her sugar intake and reduce her carbohydrates the week of chemo.  -9/102/02 A1c at 5.6  -BG at 226 today (03/30/19)  PLAN: -Labs reviewed and adequate to proceed with C5 TCHP today with dose reduction.  -Breast MRI in 1-2 weeks  -ECHO in [facility] in 1-3 weeks  -Lab, flush, F/u and Herceptin/Perjeta in 3 weeks    No problem-specific Assessment & Plan notes found for this encounter.   Orders Placed This Encounter  Procedures   MR BREAST BILATERAL W WO CONTRAST INC CAD    Epic order Draw labs Invasive breast cancer, stage II or IIIA, post preop systemic tx, assess tx response Pf- 12/07/18 @ bcg Prev mri breast- 12/21/18  Wt  38/ ht 5'3/  claus/ no metal in eyes/ breast sx- lumpectomy - 2015/ no eyes,ears, brain or heart sx/no diabetes/ no htn/no kidney or liver da/ no allergies to mri cm/ port a cath/no needs  Ins- aet[employer] mcr  No to all covid q's Pda/pt       Standing Status:   Future    Standing Expiration Date:   05/29/2020    Order Specific Question:   If indicated for the ordered procedure, I authorize the administration of contrast media per Radiology protocol    Answer:   Yes    Order Specific Question:   What is the patient's sedation  requirement?    Answer:   No Sedation    Order Specific Question:   Does  the patient have a pacemaker or implanted devices?    Answer:   No    Order Specific Question:   Radiology Contrast Protocol - do NOT remove file path    Answer:   [email or web address]    Order Specific Question:   Preferred imaging location?    Answer:   [facility] (table limit-550lbs)   ECHOCARDIOGRAM COMPLETE    Standing Status:   Future    Standing Expiration Date:   06/28/2020    Order Specific Question:   Where should this test be performed    Answer:   [facility]    Order Specific Question:   Perflutren DEFINITY (image enhancing agent) should be administered unless hypersensitivity or allergy exist    Answer:   Administer Perflutren    Order Specific Question:   Reason for exam-Echo    Answer:   Chemo  V67.2 / Z09   All questions were answered. The patient knows to call the clinic with any problems, questions or concerns. No barriers to learning was detected. I spent 20 minutes counseling the patient face to face. The total time spent in the appointment was 25 minutes and more than 50% was on counseling and review of test results     [name], MD 03/30/2019   I, Joslyn Devon, am acting as scribe for [name], MD.   I have reviewed the above documentation for accuracy and completeness, and I agree with the above.

## 2019-03-30 ENCOUNTER — Other Ambulatory Visit: Payer: Self-pay

## 2019-03-30 ENCOUNTER — Inpatient Hospital Stay: Payer: Medicare HMO

## 2019-03-30 ENCOUNTER — Encounter: Payer: Self-pay | Admitting: *Deleted

## 2019-03-30 ENCOUNTER — Encounter: Payer: Self-pay | Admitting: Hematology

## 2019-03-30 ENCOUNTER — Inpatient Hospital Stay (HOSPITAL_BASED_OUTPATIENT_CLINIC_OR_DEPARTMENT_OTHER): Payer: Medicare HMO | Admitting: Hematology

## 2019-03-30 VITALS — BP 142/67 | HR 97 | Temp 98.6°F | Resp 17 | Wt 99.1 lb

## 2019-03-30 VITALS — BP 112/78 | HR 92 | Temp 98.4°F

## 2019-03-30 DIAGNOSIS — N189 Chronic kidney disease, unspecified: Secondary | ICD-10-CM | POA: Diagnosis not present

## 2019-03-30 DIAGNOSIS — K5900 Constipation, unspecified: Secondary | ICD-10-CM | POA: Diagnosis not present

## 2019-03-30 DIAGNOSIS — C50211 Malignant neoplasm of upper-inner quadrant of right female breast: Secondary | ICD-10-CM | POA: Diagnosis not present

## 2019-03-30 DIAGNOSIS — Z171 Estrogen receptor negative status [ER-]: Secondary | ICD-10-CM

## 2019-03-30 DIAGNOSIS — C50411 Malignant neoplasm of upper-outer quadrant of right female breast: Secondary | ICD-10-CM

## 2019-03-30 DIAGNOSIS — Z95828 Presence of other vascular implants and grafts: Secondary | ICD-10-CM

## 2019-03-30 DIAGNOSIS — I129 Hypertensive chronic kidney disease with stage 1 through stage 4 chronic kidney disease, or unspecified chronic kidney disease: Secondary | ICD-10-CM | POA: Diagnosis not present

## 2019-03-30 DIAGNOSIS — G4700 Insomnia, unspecified: Secondary | ICD-10-CM | POA: Diagnosis not present

## 2019-03-30 DIAGNOSIS — Z5111 Encounter for antineoplastic chemotherapy: Secondary | ICD-10-CM | POA: Diagnosis not present

## 2019-03-30 DIAGNOSIS — Z79899 Other long term (current) drug therapy: Secondary | ICD-10-CM | POA: Diagnosis not present

## 2019-03-30 DIAGNOSIS — K219 Gastro-esophageal reflux disease without esophagitis: Secondary | ICD-10-CM | POA: Diagnosis not present

## 2019-03-30 LAB — CMP (CANCER CENTER ONLY)
ALT: 29 U/L (ref 0–44)
AST: 21 U/L (ref 15–41)
Albumin: 4.1 g/dL (ref 3.5–5.0)
Alkaline Phosphatase: 87 U/L (ref 38–126)
Anion gap: 12 (ref 5–15)
BUN: 15 mg/dL (ref 8–23)
CO2: 25 mmol/L (ref 22–32)
Calcium: 9.6 mg/dL (ref 8.9–10.3)
Chloride: 103 mmol/L (ref 98–111)
Creatinine: 0.73 mg/dL (ref 0.44–1.00)
GFR, Est AFR Am: >60 mL/min (ref >60)
GFR, Estimated: >60 mL/min (ref >60)
Glucose, Bld: 226 mg/dL — ABNORMAL HIGH (ref 70–99)
Potassium: 3.1 mmol/L — ABNORMAL LOW (ref 3.5–5.1)
Sodium: 140 mmol/L (ref 135–145)
Total Bilirubin: 0.2 mg/dL — ABNORMAL LOW (ref 0.3–1.2)
Total Protein: 6.5 g/dL (ref 6.5–8.1)

## 2019-03-30 LAB — CBC WITH DIFFERENTIAL (CANCER CENTER ONLY)
Abs Immature Granulocytes: 0.22 10*3/uL — ABNORMAL HIGH (ref 0.00–0.07)
Basophils Absolute: 0 10*3/uL (ref 0.0–0.1)
Basophils Relative: 0 %
Eosinophils Absolute: 0 10*3/uL (ref 0.0–0.5)
Eosinophils Relative: 0 %
HCT: 33.7 % — ABNORMAL LOW (ref 36.0–46.0)
Hemoglobin: 11 g/dL — ABNORMAL LOW (ref 12.0–15.0)
Immature Granulocytes: 1 %
Lymphocytes Relative: 4 %
Lymphs Abs: 0.7 10*3/uL (ref 0.7–4.0)
MCH: 31.3 pg (ref 26.0–34.0)
MCHC: 32.6 g/dL (ref 30.0–36.0)
MCV: 96 fL (ref 80.0–100.0)
Monocytes Absolute: 0.7 10*3/uL (ref 0.1–1.0)
Monocytes Relative: 3 %
Neutro Abs: 18.1 10*3/uL — ABNORMAL HIGH (ref 1.7–7.7)
Neutrophils Relative %: 92 %
Platelet Count: 241 10*3/uL (ref 150–400)
RBC: 3.51 MIL/uL — ABNORMAL LOW (ref 3.87–5.11)
RDW: 15.9 % — ABNORMAL HIGH (ref 11.5–15.5)
WBC Count: 19.7 10*3/uL — ABNORMAL HIGH (ref 4.0–10.5)
nRBC: 0 % (ref 0.0–0.2)

## 2019-03-30 MED ORDER — SODIUM CHLORIDE 0.9 % IV SOLN
Freq: Once | INTRAVENOUS | Status: AC
  Administered 2019-03-30: 10:00:00 via INTRAVENOUS
  Filled 2019-03-30: qty 250

## 2019-03-30 MED ORDER — SODIUM CHLORIDE 0.9 % IV SOLN: 390.0000 mg | Freq: Once | INTRAVENOUS | Status: DC

## 2019-03-30 MED ORDER — PALONOSETRON HCL INJECTION 0.25 MG/5ML
0.2500 mg | Freq: Once | INTRAVENOUS | Status: AC
  Administered 2019-03-30: 11:00:00 0.25 mg via INTRAVENOUS

## 2019-03-30 MED ORDER — SODIUM CHLORIDE 0.9% FLUSH
10.0000 mL | Freq: Once | INTRAVENOUS | Status: AC
  Administered 2019-03-30: 09:00:00 10 mL
  Filled 2019-03-30: qty 10

## 2019-03-30 MED ORDER — SODIUM CHLORIDE 0.9 % IV SOLN
420.0000 mg | Freq: Once | INTRAVENOUS | Status: AC
  Administered 2019-03-30: 420 mg via INTRAVENOUS
  Filled 2019-03-30: qty 14

## 2019-03-30 MED ORDER — PALONOSETRON HCL INJECTION 0.25 MG/5ML
INTRAVENOUS | Status: AC
  Filled 2019-03-30: qty 5

## 2019-03-30 MED ORDER — TRASTUZUMAB-ANNS CHEMO 150 MG IV SOLR
6.0000 mg/kg | Freq: Once | INTRAVENOUS | Status: AC
  Administered 2019-03-30: 273 mg via INTRAVENOUS
  Filled 2019-03-30: qty 13

## 2019-03-30 MED ORDER — SODIUM CHLORIDE 0.9% FLUSH
10.0000 mL | INTRAVENOUS | Status: DC | PRN
  Administered 2019-03-30: 10 mL
  Filled 2019-03-30: qty 10

## 2019-03-30 MED ORDER — DIPHENHYDRAMINE HCL 25 MG PO CAPS
50.0000 mg | ORAL_CAPSULE | Freq: Once | ORAL | Status: AC
  Administered 2019-03-30: 50 mg via ORAL

## 2019-03-30 MED ORDER — HEPARIN SOD (PORK) LOCK FLUSH 100 UNIT/ML IV SOLN
500.0000 [IU] | Freq: Once | INTRAVENOUS | Status: AC | PRN
  Administered 2019-03-30: 500 [IU]
  Filled 2019-03-30: qty 5

## 2019-03-30 MED ORDER — SODIUM CHLORIDE 0.9 % IV SOLN: 75.0000 mg/m2 | Freq: Once | INTRAVENOUS | Status: DC

## 2019-03-30 MED ORDER — ACETAMINOPHEN 325 MG PO TABS
650.0000 mg | ORAL_TABLET | Freq: Once | ORAL | Status: AC
  Administered 2019-03-30: 650 mg via ORAL

## 2019-03-30 MED ORDER — SODIUM CHLORIDE 0.9 % IV SOLN
320.0000 mg | Freq: Once | INTRAVENOUS | Status: AC
  Administered 2019-03-30: 320 mg via INTRAVENOUS
  Filled 2019-03-30: qty 32

## 2019-03-30 MED ORDER — DIPHENHYDRAMINE HCL 25 MG PO CAPS
ORAL_CAPSULE | ORAL | Status: AC
  Filled 2019-03-30: qty 2

## 2019-03-30 MED ORDER — ACETAMINOPHEN 325 MG PO TABS
ORAL_TABLET | ORAL | Status: AC
  Filled 2019-03-30: qty 2

## 2019-03-30 MED ORDER — SODIUM CHLORIDE 0.9 % IV SOLN
Freq: Once | INTRAVENOUS | Status: AC
  Administered 2019-03-30: 11:00:00 via INTRAVENOUS
  Filled 2019-03-30: qty 5

## 2019-03-30 MED ORDER — SODIUM CHLORIDE 0.9 % IV SOLN
65.0000 mg/m2 | Freq: Once | INTRAVENOUS | Status: AC
  Administered 2019-03-30: 90 mg via INTRAVENOUS
  Filled 2019-03-30: qty 9

## 2019-03-30 NOTE — Progress Notes (Signed)
Spoke w/ Dr. Burr Medico, chemo doses will remain at reduced doses from last cycle - docetaxel 65 mg/m2 and carboplatin AUC=5.   Demetrius Charity, PharmD, [facility] Pharmacist Pharmacy Phone: [phone] 03/30/2019

## 2019-03-30 NOTE — Progress Notes (Signed)
[place] Work  Holiday representative met with patient in the infusion room at [facility] to offer support and follow up on financial assistance applications.  CSW and patient reviewed applications for [employer], cancer care, and [facility].  CSW completed and submitted all applications.  CSW and patient also completed paperwork for [employer] and patient was given her first set of gift cards.    Johnnye Lana, MSW, LCSW, OSW-C Clinical Social Worker [facility] [phone]

## 2019-03-30 NOTE — Patient Instructions (Signed)

## 2019-03-30 NOTE — Progress Notes (Signed)
Nutrition Follow-up:  Patient with right breast cancer.  Patient receiving chemotherapy and followed by Dr. Burr Medico.    Met with patient today during infusion.  Patient has had issues with nausea, diarrhea.  Reports that this will be her last treatment.  Also having a hard time with taste changes.  Reports that chocolate ensure is working for her.  Only drinking 1 per day.  Still trying to make smoothie in the am and adding protein powder.  Chicken noodle soup has been tasting fairly normal.      Medications: protonix, compazine, dexa before and after treatment, imodium  Labs: K 3.1, glucose 226  Anthropometrics:   Weight decreased to 99 lb from 101 lb   NUTRITION DIAGNOSIS: Inadequate oral intake continues   INTERVENTION:  Discussed strategies to help with taste change.  Handout provided. Discussed strategies to help with nausea.   Discussed ways to increase calories and protein in current eating pattern Provided samples of chocolate ensure enlive along with coupons.  Patient agreeable to case of vanilla (prefers chocolate) ensure enlive.  We discussed how to flavor it.   Patient has contact information.  Declined follow up phone call at this time but will reach out to RD if needed in the future.     MONITORING, EVALUATION, GOAL: Patient will consume adequate calories and protein to prevent further weight loss   NEXT VISIT: declined follow-up.  Patient will reach out to RD, if needed  Emberly Tomasso B. Zenia Resides, [place], [place] Registered Dietitian [phone] (pager)

## 2019-03-30 NOTE — Patient Instructions (Signed)
[facility] Discharge Instructions for Patients Receiving Chemotherapy  Today you received the following chemotherapy agents Trastuzumab-anns (KANJINTI), Pertuzumab (PERJETA), Docetaxel (TAXOTERE) & Carboplatin (PARAPLATIN).  To help prevent nausea and vomiting after your treatment, we encourage you to take your nausea medication as prescribed.   If you develop nausea and vomiting that is not controlled by your nausea medication, call the clinic.   BELOW ARE SYMPTOMS THAT SHOULD BE REPORTED IMMEDIATELY:  *FEVER GREATER THAN 100.5 F  *CHILLS WITH OR WITHOUT FEVER  NAUSEA AND VOMITING THAT IS NOT CONTROLLED WITH YOUR NAUSEA MEDICATION  *UNUSUAL SHORTNESS OF BREATH  *UNUSUAL BRUISING OR BLEEDING  TENDERNESS IN MOUTH AND THROAT WITH OR WITHOUT PRESENCE OF ULCERS  *URINARY PROBLEMS  *BOWEL PROBLEMS  UNUSUAL RASH Items with * indicate a potential emergency and should be followed up as soon as possible.  Feel free to call the clinic should you have any questions or concerns. The clinic phone number is [phone].  Please show the CHEMO ALERT CARD at check-in to the Emergency Department and triage nurse.  Coronavirus (COVID-19) Are you at risk?  Are you at risk for the Coronavirus (COVID-19)?  To be considered HIGH RISK for Coronavirus (COVID-19), you have to meet the following criteria:  . Traveled to [place], [place], [place], [place] or [place]; or in the [place] to [place], [place], [place], or [place]; and have fever, cough, and shortness of breath within the last 2 weeks of travel OR . Been in close contact with a person diagnosed with COVID-19 within the last 2 weeks and have fever, cough, and shortness of breath . IF YOU DO NOT MEET THESE CRITERIA, YOU ARE CONSIDERED LOW RISK FOR COVID-19.  What to do if you are HIGH RISK for COVID-19?  . If you are having a medical emergency, call 911. . Seek medical care right away. Before you go to a  doctor's office, urgent care or emergency department, call ahead and tell them about your recent travel, contact with someone diagnosed with COVID-19, and your symptoms. You should receive instructions from your physician's office regarding next steps of care.  . When you arrive at healthcare provider, tell the healthcare staff immediately you have returned from visiting [place], [place], [place], [place] or [place]; or traveled in the [place] to [place], [place], [place], or [place]; in the last two weeks or you have been in close contact with a person diagnosed with COVID-19 in the last 2 weeks.   . Tell the health care staff about your symptoms: fever, cough and shortness of breath. . After you have been seen by a medical provider, you will be either: o Tested for (COVID-19) and discharged home on quarantine except to seek medical care if symptoms worsen, and asked to  - Stay home and avoid contact with others until you get your results (4-5 days)  - Avoid travel on public transportation if possible (such as bus, train, or airplane) or o Sent to the Emergency Department by EMS for evaluation, COVID-19 testing, and possible admission depending on your condition and test results.  What to do if you are LOW RISK for COVID-19?  Reduce your risk of any infection by using the same precautions used for avoiding the common cold or flu:  . Wash your hands often with soap and warm water for at least 20 seconds.  If soap and water are not readily available, use an alcohol-based hand sanitizer with at least   60% alcohol.  . If coughing or sneezing, cover your mouth and nose by coughing or sneezing into the elbow areas of your shirt or coat, into a tissue or into your sleeve (not your hands). . Avoid shaking hands with others and consider head nods or verbal greetings only. . Avoid touching your eyes, nose, or mouth with unwashed hands.  . Avoid close contact with people who are sick. . Avoid  places or events with large numbers of people in one location, like concerts or sporting events. . Carefully consider travel plans you have or are making. . If you are planning any travel outside or inside the [place], visit the CDC's Travelers' Health webpage for the latest health notices. . If you have some symptoms but not all symptoms, continue to monitor at home and seek medical attention if your symptoms worsen. . If you are having a medical emergency, call 911.   ADDITIONAL HEALTHCARE OPTIONS FOR PATIENTS  [place] [facility] Telehealth / e-Visit: [email or web address]         [facility]: [phone]   Urgent Care: [phone]                   [facility]: [phone]    

## 2019-03-30 NOTE — Progress Notes (Signed)
Met with patient in infusion room to obtain [employer] and proof of income.  Patient approved for the one-time $1000 [employer] grant and was given a copy of the expense sheet as well as the Outpatient pharmacy information. She received a gift card today for gas/groceries; etc. Went over in detail other expenses and how they are covered.  Gave her a Medicaid application as discussed before and advised how to submit. There is no other available assistance for her diagnosis.   Gave her my card for any additional financial questions or concerns. Social worker is working with her as well for resources.

## 2019-03-31 ENCOUNTER — Telehealth: Payer: Self-pay | Admitting: Hematology

## 2019-03-31 NOTE — Telephone Encounter (Signed)
Scheduled appt per 9/29 los.  Did not cancel appts on 10/21 due to the pt needing an appt in 3 weeks with treatment.  I shorten the duration for the treatment and did a trade-off.  Spoke with patient and she was okay with keeping the appt for that day and time.

## 2019-04-01 ENCOUNTER — Ambulatory Visit: Payer: Medicare HMO

## 2019-04-01 ENCOUNTER — Other Ambulatory Visit: Payer: Medicare HMO

## 2019-04-01 ENCOUNTER — Ambulatory Visit: Payer: Medicare HMO | Admitting: Hematology

## 2019-04-02 ENCOUNTER — Other Ambulatory Visit: Payer: Self-pay

## 2019-04-02 ENCOUNTER — Inpatient Hospital Stay: Payer: Medicare HMO | Attending: Hematology

## 2019-04-02 VITALS — BP 131/60 | HR 86 | Temp 99.1°F | Resp 18

## 2019-04-02 DIAGNOSIS — I129 Hypertensive chronic kidney disease with stage 1 through stage 4 chronic kidney disease, or unspecified chronic kidney disease: Secondary | ICD-10-CM | POA: Diagnosis not present

## 2019-04-02 DIAGNOSIS — G4700 Insomnia, unspecified: Secondary | ICD-10-CM | POA: Diagnosis not present

## 2019-04-02 DIAGNOSIS — K5900 Constipation, unspecified: Secondary | ICD-10-CM | POA: Diagnosis not present

## 2019-04-02 DIAGNOSIS — Z79899 Other long term (current) drug therapy: Secondary | ICD-10-CM | POA: Insufficient documentation

## 2019-04-02 DIAGNOSIS — Z95828 Presence of other vascular implants and grafts: Secondary | ICD-10-CM

## 2019-04-02 DIAGNOSIS — K219 Gastro-esophageal reflux disease without esophagitis: Secondary | ICD-10-CM | POA: Diagnosis not present

## 2019-04-02 DIAGNOSIS — Z171 Estrogen receptor negative status [ER-]: Secondary | ICD-10-CM | POA: Diagnosis not present

## 2019-04-02 DIAGNOSIS — N189 Chronic kidney disease, unspecified: Secondary | ICD-10-CM | POA: Diagnosis not present

## 2019-04-02 DIAGNOSIS — C50411 Malignant neoplasm of upper-outer quadrant of right female breast: Secondary | ICD-10-CM | POA: Diagnosis present

## 2019-04-02 DIAGNOSIS — C50211 Malignant neoplasm of upper-inner quadrant of right female breast: Secondary | ICD-10-CM | POA: Insufficient documentation

## 2019-04-02 MED ORDER — SODIUM CHLORIDE 0.9% FLUSH
10.0000 mL | Freq: Once | INTRAVENOUS | Status: DC | PRN
  Filled 2019-04-02: qty 10

## 2019-04-02 MED ORDER — ONDANSETRON HCL 4 MG/2ML IJ SOLN: 8.0000 mg | Freq: Once | INTRAMUSCULAR | Status: DC

## 2019-04-02 MED ORDER — HEPARIN SOD (PORK) LOCK FLUSH 100 UNIT/ML IV SOLN
250.0000 [IU] | Freq: Once | INTRAVENOUS | Status: DC | PRN
  Filled 2019-04-02: qty 5

## 2019-04-02 MED ORDER — PEGFILGRASTIM-CBQV 6 MG/0.6ML ~~LOC~~ SOSY
PREFILLED_SYRINGE | SUBCUTANEOUS | Status: AC
  Filled 2019-04-02: qty 0.6

## 2019-04-02 MED ORDER — SODIUM CHLORIDE 0.9% FLUSH
3.0000 mL | Freq: Once | INTRAVENOUS | Status: DC | PRN
  Filled 2019-04-02: qty 10

## 2019-04-02 MED ORDER — ALTEPLASE 2 MG IJ SOLR
2.0000 mg | Freq: Once | INTRAMUSCULAR | Status: DC | PRN
  Filled 2019-04-02: qty 2

## 2019-04-02 MED ORDER — HEPARIN SOD (PORK) LOCK FLUSH 100 UNIT/ML IV SOLN
500.0000 [IU] | Freq: Once | INTRAVENOUS | Status: DC | PRN
  Filled 2019-04-02: qty 5

## 2019-04-02 MED ORDER — PROMETHAZINE HCL 25 MG/ML IJ SOLN: 25.0000 mg | Freq: Once | INTRAMUSCULAR | Status: DC

## 2019-04-02 MED ORDER — SODIUM CHLORIDE 0.9 % IV SOLN
Freq: Once | INTRAVENOUS | Status: AC
  Administered 2019-04-02: 13:00:00 via INTRAVENOUS
  Filled 2019-04-02: qty 250

## 2019-04-02 MED ORDER — PEGFILGRASTIM-CBQV 6 MG/0.6ML ~~LOC~~ SOSY
6.0000 mg | PREFILLED_SYRINGE | Freq: Once | SUBCUTANEOUS | Status: AC
  Administered 2019-04-02: 6 mg via SUBCUTANEOUS

## 2019-04-02 MED ORDER — SODIUM CHLORIDE 0.9 % IV SOLN: Freq: Once | INTRAVENOUS | Status: DC

## 2019-04-02 NOTE — Patient Instructions (Signed)

## 2019-04-03 ENCOUNTER — Ambulatory Visit: Payer: Medicare HMO

## 2019-04-12 ENCOUNTER — Telehealth: Payer: Self-pay

## 2019-04-12 DIAGNOSIS — B029 Zoster without complications: Secondary | ICD-10-CM | POA: Diagnosis not present

## 2019-04-12 NOTE — Telephone Encounter (Signed)
Patient calls stating she has developed a rash in the vaginal and buttocks area, mild itching, no oozing.  Suggested she contact her PCP today to be evaluated.

## 2019-04-13 ENCOUNTER — Encounter: Payer: Self-pay | Admitting: General Practice

## 2019-04-13 NOTE — Progress Notes (Signed)
[place] CSW Progress Notes  Patient approved for a $2500 grant from [employer] [place] for unpaid medical expenses.  Email received from Macky Lower to that effect.  Edwyna Shell, LCSW Clinical Social Worker Phone:  [phone]

## 2019-04-14 ENCOUNTER — Ambulatory Visit
Admission: RE | Admit: 2019-04-14 | Discharge: 2019-04-14 | Disposition: A | Discharge status: Home / Self Care | Payer: Medicare HMO | Source: Ambulatory Visit | Attending: Hematology | Admitting: Hematology

## 2019-04-14 ENCOUNTER — Other Ambulatory Visit: Payer: Self-pay

## 2019-04-14 DIAGNOSIS — C50411 Malignant neoplasm of upper-outer quadrant of right female breast: Secondary | ICD-10-CM

## 2019-04-14 DIAGNOSIS — C969 Malignant neoplasm of lymphoid, hematopoietic and related tissue, unspecified: Secondary | ICD-10-CM | POA: Diagnosis not present

## 2019-04-14 DIAGNOSIS — C50911 Malignant neoplasm of unspecified site of right female breast: Secondary | ICD-10-CM | POA: Diagnosis not present

## 2019-04-14 DIAGNOSIS — Z171 Estrogen receptor negative status [ER-]: Secondary | ICD-10-CM

## 2019-04-14 MED ORDER — GADOBUTROL 1 MMOL/ML IV SOLN
4.0000 mL | Freq: Once | INTRAVENOUS | Status: AC | PRN
  Administered 2019-04-14: 4 mL via INTRAVENOUS

## 2019-04-15 ENCOUNTER — Ambulatory Visit (HOSPITAL_COMMUNITY)
Admission: RE | Admit: 2019-04-15 | Discharge: 2019-04-15 | Disposition: A | Discharge status: Home / Self Care | Payer: Medicare HMO | Source: Ambulatory Visit | Attending: Hematology | Admitting: Hematology

## 2019-04-15 ENCOUNTER — Encounter: Payer: Self-pay | Admitting: Hematology

## 2019-04-15 ENCOUNTER — Other Ambulatory Visit: Payer: Self-pay

## 2019-04-15 DIAGNOSIS — I081 Rheumatic disorders of both mitral and tricuspid valves: Secondary | ICD-10-CM | POA: Insufficient documentation

## 2019-04-15 DIAGNOSIS — C50411 Malignant neoplasm of upper-outer quadrant of right female breast: Secondary | ICD-10-CM | POA: Diagnosis not present

## 2019-04-15 DIAGNOSIS — E785 Hyperlipidemia, unspecified: Secondary | ICD-10-CM | POA: Diagnosis not present

## 2019-04-15 DIAGNOSIS — R188 Other ascites: Secondary | ICD-10-CM | POA: Insufficient documentation

## 2019-04-15 DIAGNOSIS — Z171 Estrogen receptor negative status [ER-]: Secondary | ICD-10-CM

## 2019-04-15 DIAGNOSIS — Z09 Encounter for follow-up examination after completed treatment for conditions other than malignant neoplasm: Secondary | ICD-10-CM | POA: Diagnosis not present

## 2019-04-15 DIAGNOSIS — I10 Essential (primary) hypertension: Secondary | ICD-10-CM | POA: Insufficient documentation

## 2019-04-15 NOTE — Progress Notes (Signed)
  Echocardiogram 2D Echocardiogram has been performed.  Christina Sexton 04/15/2019, 1:59 PM

## 2019-04-16 ENCOUNTER — Other Ambulatory Visit: Payer: Self-pay | Admitting: General Surgery

## 2019-04-16 DIAGNOSIS — Z171 Estrogen receptor negative status [ER-]: Secondary | ICD-10-CM | POA: Diagnosis not present

## 2019-04-16 DIAGNOSIS — C50411 Malignant neoplasm of upper-outer quadrant of right female breast: Secondary | ICD-10-CM

## 2019-04-20 ENCOUNTER — Other Ambulatory Visit: Payer: Self-pay | Admitting: General Surgery

## 2019-04-20 ENCOUNTER — Telehealth: Payer: Self-pay

## 2019-04-20 DIAGNOSIS — C50411 Malignant neoplasm of upper-outer quadrant of right female breast: Secondary | ICD-10-CM

## 2019-04-20 NOTE — Telephone Encounter (Signed)
Called patient back to see about getting her appointments moved to next week or the following week since she currently has shingles. Patient was hesitant to reschedule because she didn't know how long her shingles outbreak weould last, and she was worried about not recovering fully before her surgery on 05/19/19. I informed her it would be easier to move tomorrow's appointments and then cancel them if her shingles is still active, then to try and call to get worked in when she's feeling better. I also tried explaining that she would only be getting the immunotherapy portion of her regimen now so hopefully she wouldn't feel as depleted as she did with the chemo/immuno treatment. She still did not want to commit to a new date. I asked her if that meant she wanted to hold all treatment until after her surgery? She stated that yes, ideally that is what she would want. I told her if that was the case and a long period of time went between when she last got trastuzumab/pertuzumab there was a chance she would have to get reloaded and have a longer appointment slot when she finally came in for treatment. With that information she said she would agree to the first week in November (either 11/2 or 11/3) and would call to move those appointments again if her shingles was still active. Denied any other needs at this time.   Scheduling message sent to contact patient to reschedule appointments to first week of November (either 11/2 or 11/3).

## 2019-04-21 ENCOUNTER — Other Ambulatory Visit: Payer: Medicare HMO

## 2019-04-21 ENCOUNTER — Ambulatory Visit: Payer: Medicare HMO | Admitting: Hematology

## 2019-04-21 ENCOUNTER — Ambulatory Visit: Payer: Medicare HMO

## 2019-04-22 ENCOUNTER — Encounter: Payer: Self-pay | Admitting: Hematology

## 2019-04-22 ENCOUNTER — Encounter: Payer: Self-pay | Admitting: General Practice

## 2019-04-22 NOTE — Progress Notes (Signed)
Received forwarded message from patient regarding billing and financial.  Called patient and advised I was her Arboriculturist whom spoke with her last month and got her approved for the grant. She recalled.  Patient had concern with information she received from [place] in [place] and what to do with it. Gave her the number to Webb Silversmith in social work whom received the same documentation and was documented in notes.  She had question about another gas card, advised she may receive another one when she comes on 11/3 and it will be left at front desk. She verbalized understanding and has my card for any additional financial questions or concerns.

## 2019-04-22 NOTE — Progress Notes (Signed)
[place]  Patient approved for a $2,500 grant from [employer] in [employer]. Patient has bills to submit, advised to send directly to [facility] for payment up to the grant amount.  CSW will let Foundation know these are being sent.  Edwyna Shell, LCSW Clinical Social Worker Phone:  [phone]

## 2019-04-23 ENCOUNTER — Ambulatory Visit: Payer: Medicare HMO

## 2019-04-24 ENCOUNTER — Ambulatory Visit: Payer: Medicare HMO

## 2019-05-03 NOTE — Progress Notes (Signed)
[place]   [phone] [phone]   Clinic Follow up Note   Patient Care Team: Deland Pretty, MD as PCP - General (Internal Medicine) [name], RN as Oncology Nurse Navigator [name], RN as Oncology Nurse Navigator [name], MD as Consulting Physician (General Surgery) [name], MD as Consulting Physician (Hematology) [name], MD as Attending Physician (Radiation Oncology) 05/04/2019  CHIEF COMPLAINT: F/u right breast cancer   SUMMARY OF ONCOLOGIC HISTORY: Oncology History  Malignant neoplasm of upper-outer quadrant of right female breast ([place])  12/07/2018 Mammogram   Mammogram 12/07/18  IMPRESSION: 1. 3 cm mass in the 11 o'clock position of the right breast, 4 cm the nipple, measuring 3 x 2 x 2 cm, highly suspicious for breast carcinoma. No enlarged or abnormal right axillary lymph nodes.   12/09/2018 Cancer Staging   Staging form: Breast, AJCC 8th Edition - Clinical stage from 12/09/2018: Stage IIA (cT2, cN0, cM0, G3, ER-, PR-, HER2+) - Signed by [name], MD on 12/15/2018   12/09/2018 Initial Biopsy   Diagnosis 12/09/18 Breast, right, needle core biopsy, 11 o'clock - INVASIVE DUCTAL CARCINOMA. - DUCTAL CARCINOMA IN SITU. - SEE COMMENT.   12/09/2018 Receptors her2   The tumor cells are POSITIVE for Her2 (3+). Estrogen Receptor: 0%, NEGATIVE Progesterone Receptor: 0%, NEGATIVE Proliferation Marker Ki67: 30%   12/15/2018 Initial Diagnosis   Malignant neoplasm of upper-outer quadrant of right female breast ([place])   12/21/2018 Breast MRI   IMPRESSION: 1. Approximate 4 cm mass involving the UPPER RIGHT breast with extension into the UPPER INNER QUADRANT and UPPER OUTER QUADRANT, predominantly located in the [place], biopsy-proven invasive ductal carcinoma and DCIS. The mass abuts the chest wall though I cannot confirm pectoralis muscle invasion. 2. Superficial satellite mass versus pathologic  intramammary lymph node in the UPPER OUTER QUADRANT of the RIGHT breast measuring approximately 1 cm, located LATERAL to the dominant mass. 3. No MRI evidence of malignancy involving the LEFT breast. 4. No pathologic lymphadenopathy otherwise.   12/24/2018 Pathology Results   Diagnosis Breast, right, needle core biopsy, upper outer quadrant, peripheral to the recently biopsy proven breast carcinoma - INVASIVE DUCTAL CARCINOMA. SEE COMMENT. Results: IMMUNOHISTOCHEMICAL AND MORPHOMETRIC ANALYSIS PERFORMED MANUALLY The tumor cells are POSITIVE for Her2 (3+). Estrogen Receptor: 0%, NEGATIVE Progesterone Receptor: 0%, NEGATIVE Proliferation Marker Ki67: 40%   01/06/2019 -  Chemotherapy   Neoadjuvant chemotherapy TCHP beginning 01/06/19 completed 5 cycle of TCHP on 03/30/19. -Started Herceptin and Perjeta maintanence therapy on 04/21/19   04/15/2019 Breast MRI   IMPRESSION: 1. Interval resolution of the 2 enhancing masses corresponding with biopsy proven malignancy in the upper right breast. There is no residual or new, suspicious enhancement. 2. No MRI evidence of malignancy in the left breast.   04/15/2019 Echocardiogram   FINDINGS  Left Ventricle: Left ventricular ejection fraction, by visual estimation, is 55 to 60%. The left ventricle has normal function. There is no left ventricular hypertrophy. Normal left ventricular size. Spectral Doppler shows Left ventricular diastolic  parameters were normal pattern of LV diastolic filling. Compared with the echo 3/35/45, systolic function is slightly worse, especially in the anteroseptum. However overall strain is slightly better.     05/04/2019 -  Chemotherapy   The patient had pertuzumab (PERJETA) 840 mg in sodium chloride 0.9 % 250 mL chemo infusion, 840 mg, Intravenous, Once, 1 of 5 cycles trastuzumab-anns (KANJINTI) 357 mg in sodium chloride 0.9 % 250 mL chemo infusion, 8 mg/kg = 357  mg (100 % of original dose 8 mg/kg), Intravenous,  Once, 1 of 5  cycles Dose modification: 8 mg/kg (original dose 8 mg/kg, Cycle 1, Reason: Other (see comments), Comment: has insurance approval), 6 mg/kg (original dose 6 mg/kg, Cycle 2, Reason: Other (see comments), Comment: dosed q 3 weeks) trastuzumab-dkst (OGIVRI) 357 mg in sodium chloride 0.9 % 250 mL chemo infusion, 8 mg/kg = 357 mg, Intravenous,  Once, 1 of 1 cycle  for chemotherapy treatment.      CURRENT THERAPY: Neoadjuvant chemotherapy TCHP beginning 01/06/19; maintenance herceptin and perjeta starting 05/04/19   INTERVAL HISTORY: Christina Sexton returns for f/u and treatment as scheduled. She completed cycle 5 dose-reduced TCHP on 03/30/19. She developed shingles outbreak on her left buttock and perineum. She completed valtrex. Does not have much postherpetic neuralgia, some itching and discomfort. Energy level and appetite are better with more time off chemo. She has mild nausea and diarrhea, managed with home meds. She has stable "dry hacking cough" since starting treatment, no chest pain, dyspnea, or leg swelling. No recent fever or chills. She noticed more numbness in her fingertips when getting dressed this morning.  She does not palpate a breast mass.    MEDICAL HISTORY:  Past Medical History:  Diagnosis Date  . Allergy   . Anemia   . Asthma   . Cancer ([place])    Breast-R  . Chronic kidney disease   . Hyperlipidemia   . Hypertension   . Kidney stone   . Mitral valve prolapse   . Osteoporosis     SURGICAL HISTORY: Past Surgical History:  Procedure Laterality Date  . BREAST BIOPSY Left 12/27/2015   benign  . BREAST BIOPSY Right 07/26/2013   benign  . BREAST EXCISIONAL BIOPSY Right 09/08/2013   high risk   . PORTACATH PLACEMENT N/A 12/28/2018   Procedure: INSERTION PORT-A-CATH;  Surgeon: [name], MD;  Location: [facility];  Service: General;  Laterality: N/A;  . SHOULDER SURGERY      I have reviewed the social history and family history with the patient and they are unchanged from  previous note.  ALLERGIES:  has No Known Allergies.  MEDICATIONS:  Current Outpatient Medications  Medication Sig Dispense Refill  . atorvastatin (LIPITOR) 20 MG tablet Take 20 mg by mouth daily.    Marland Kitchen lidocaine-prilocaine (EMLA) cream Apply to affected area once 30 g 3  . pantoprazole (PROTONIX) 20 MG tablet TAKE 1 TABLET BY MOUTH EVERY DAY 30 tablet 1  . potassium chloride SA (KLOR-CON) 20 MEQ tablet Take 1 tablet (20 mEq total) by mouth every other day. 30 tablet 1  . prochlorperazine (COMPAZINE) 10 MG tablet Take 1 tablet (10 mg total) by mouth every 6 (six) hours as needed (Nausea or vomiting). 30 tablet 1  . dexamethasone (DECADRON) 4 MG tablet Take 2 tablets (8 mg total) by mouth 2 (two) times daily. Start the day before Taxotere. Take once the day after, then 2 times a day x 2d. (Patient not taking: Reported on 05/04/2019) 30 tablet 1  . ondansetron (ZOFRAN) 8 MG tablet Take 1 tablet (8 mg total) by mouth 2 (two) times daily as needed (Nausea or vomiting). Begin 4 days after chemotherapy. (Patient not taking: Reported on 03/11/2019) 30 tablet 1   Current Facility-Administered Medications  Medication Dose Route Frequency Provider Last Rate Last Dose  . 0.9 %  sodium chloride infusion  500 mL Intravenous Once Nandigam, Venia Minks, MD       Facility-Administered Medications Ordered in  Other Visits  Medication Dose Route Frequency Provider Last Rate Last Dose  . heparin lock flush 100 unit/mL  500 Units Intracatheter Once PRN [name], MD      . pertuzumab (PERJETA) 840 mg in sodium chloride 0.9 % 250 mL chemo infusion  840 mg Intravenous Once [name], MD      . sodium chloride flush (NS) 0.9 % injection 10 mL  10 mL Intracatheter PRN [name], MD      . Theotis Burrow [facility]) 357 mg in sodium chloride 0.9 % 250 mL chemo infusion  8 mg/kg (Treatment Plan Recorded) Intravenous Once [name], MD        PHYSICAL EXAMINATION: ECOG PERFORMANCE STATUS: 1 - Symptomatic but completely  ambulatory  Vitals:   05/04/19 1130  BP: 132/75  Pulse: 88  Resp: 18  Temp: 98 F (36.7 C)  SpO2: 100%   Filed Weights   05/04/19 1130  Weight: 99 lb 1.6 oz (45 kg)    GENERAL:alert, no distress and comfortable SKIN: shingles rash to left buttock and perineum, no open sores.  EYES:  sclera clear LYMPH:  no palpable cervical, supraclavicular, or axillary lymphadenopathy   LUNGS: respirations even and unlabored, with normal breathing effort ABDOMEN:abdomen flat  Musculoskeletal: no lower extremity edema  NEURO: alert & oriented x 3 with fluent speech, normal gait Breast: no palpable mass in either breast that I could appreciate   LABORATORY DATA:  I have reviewed the data as listed CBC Latest Ref Rng & Units 05/04/2019 03/30/2019 03/11/2019  WBC 4.0 - 10.5 K/uL 4.2 19.7(H) 16.1(H)  Hemoglobin 12.0 - 15.0 g/dL 10.8(L) 11.0(L) 11.6(L)  Hematocrit 36.0 - 46.0 % 33.3(L) 33.7(L) 35.4(L)  Platelets 150 - 400 K/uL 162 241 201     CMP Latest Ref Rng & Units 05/04/2019 03/30/2019 03/11/2019  Glucose 70 - 99 mg/dL 93 226(H) 157(H)  BUN 8 - 23 mg/dL _0 Creatinine 0.44 - 1.00 mg/dL 0.70 0.73 0.76  Sodium 135 - 145 mmol/L 143 140 141  Potassium 3.5 - 5.1 mmol/L 3.6 3.1(L) 3.5  Chloride 98 - 111 mmol/L 106 103 104  CO2 22 - 32 mmol/L _1 Calcium 8.9 - 10.3 mg/dL 9.1 9.6 9.6  Total Protein 6.5 - 8.1 g/dL 6.4(L) 6.5 6.6  Total Bilirubin 0.3 - 1.2 mg/dL 0.5 0.2(L) 0.4  Alkaline Phos 38 - 126 U/L 55 87 80  AST 15 - 41 U/L _2 ALT 0 - 44 U/L 32 29 42      RADIOGRAPHIC STUDIES: I have personally reviewed the radiological images as listed and agreed with the findings in the report. No results found.   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 Burr Medico previously reviewed her secondary biopsy, which shows satellite lesion in right UOQ that is positive for invasive ductal carcinoma; she has multi-focal disease with similar prognostic features. -baseline echo 60-65%;she was referred to cardiology on 01/06/19 but has not seen anyone -she began neoadjuvant chemotherapy with TCHP on 01/06/19; she tolerated cycle 1 poorly during first week with insomnia, constipation, poor appetite, mild weight loss, rash, and acid reflux. herceptin and perjeta reduced with cycle 2. taxotere was reduced from 75 mg/m2 to 65 mg/m2 and carboplatin reduced to 320 mg  on cycles 4 and 5. Overall, chemotherapy was difficult for her -Right breast mass no longer palpable on chemotherapy, indicating good treatment response  -Neoadjuvant chemo was stopped after cycle 5 due to poor tolerance and patient preference. She underwent MRI on 10/14 that showed resolution of the 2 enhancing masses in the right breast. I reviewed this with her today. -repeat echo on 10/18 showed reduced EF to 76-73%, systolic function is slightly worse but overall strain is better. She is being referred to cardiology again -she began maintenance herceptin and perjeta on 11/3 -lumpectomy per Dr. Barry Dienes is scheduled for 11/18  -f/u post op to review final path and discuss treatment plan   2. Shingles to left buttock -She developed shingles outbreak on her buttock after cycle 5 TCHP dosed on 03/30/19, s/p valacyclovir  -rash is resolving, minimal postherpetic neuralgia   3. Hyperglycemia -related to steroids  -BG 225 on 02/17/19  -improved with reduced dex, now takes 4 mg BID day before chemo and 4 mg daily for 3 days after -BG improved -BG 93 today. She has stopped Dex -A1c 5.6 on 9/10  4. Social and financial support  -she has friends, no family -she has strong faith in [place]. Our chaplain has reached out to her -she is concerned about mounting  medical bills and is interested in talking with financial advocate today. She has medicare. I referred her. Due to privacy issues, she prefers to be called at home.  -She is worried about her recovery from surgery and further treatment; chemo has been difficult for her.  -she is reluctant to schedule more chemo until she recovers from surgery  Disposition:  Christina Sexton appears stable. She completed 5 cycles neoadjuvant TCHP with dose reduction. She tolerated fairly well, with fatigue, weight loss, nausea, and diarrhea. She has been able to recover moderately well today, remains fatigued with intermittent but controlled nausea and diarrhea. She will continue symptom management with home meds which is effective. She has IVF on 11/6. I recommend to continue oral K every other day. I refilled for her.   She developed shingles on her buttock after cycle 5. She completed valacyclovir. Rash is dry, no open sores. She is recovering well without significant postherpetic neuralgia.   She underwent breast MRI on 10/14 that showed complete resolution of right breast masses, she had excellent response to chemo. I reviewed this with her today. She had repeat echo which was interpreted EF 41-93%, systolic function is slightly worse, however overall strain is slightly better. She is being referred to cardiologist, patient agrees. She will proceed with maintenance herceptin and perjeta today. I reviewed the plan with Dr. Burr Medico.   Christina Sexton is scheduled to undergo lumpectomy on 11/18 per Dr. Barry Dienes. I reviewed the plan to see her back in 3 weeks to review final path and continue maintenance herceptin/perjeta q3 weeks. Christina Sexton wants longer to recover and does not wish to schedule more infusions at this time. I explained the rationale for maintenance anti-HER2 therapy. She will wait to hear from cardiologist and talk with Dr. Burr Medico about final path and further treatments. If she agrees with recommendations, she will  schedule additional treatments after her f/u in 4 weeks. She will keep her infusion appointment on 11/6.    Orders Placed This Encounter  Procedures  . Ambulatory referral to Cardiology    Referral Priority:   Routine    Referral Type:   Consultation    Referral Reason:   Specialty Services Required  Requested Specialty:   Cardiology    Number of Visits Requested:   1   All questions were answered. The patient knows to call the clinic with any problems, questions or concerns. No barriers to learning was detected. I spent 20 minutes counseling the patient face to face. The total time spent in the appointment was 25 minutes and more than 50% was on counseling and review of test results     Alla Feeling, NP 05/04/19

## 2019-05-04 ENCOUNTER — Other Ambulatory Visit: Payer: Self-pay | Admitting: Hematology

## 2019-05-04 ENCOUNTER — Encounter: Payer: Self-pay | Admitting: Nurse Practitioner

## 2019-05-04 ENCOUNTER — Inpatient Hospital Stay: Payer: Medicare HMO

## 2019-05-04 ENCOUNTER — Inpatient Hospital Stay: Payer: Medicare HMO | Attending: Hematology

## 2019-05-04 ENCOUNTER — Other Ambulatory Visit: Payer: Self-pay

## 2019-05-04 ENCOUNTER — Inpatient Hospital Stay (HOSPITAL_BASED_OUTPATIENT_CLINIC_OR_DEPARTMENT_OTHER): Payer: Medicare HMO | Admitting: Nurse Practitioner

## 2019-05-04 VITALS — BP 132/75 | HR 88 | Temp 98.0°F | Resp 18 | Ht 63.0 in | Wt 99.1 lb

## 2019-05-04 DIAGNOSIS — I129 Hypertensive chronic kidney disease with stage 1 through stage 4 chronic kidney disease, or unspecified chronic kidney disease: Secondary | ICD-10-CM | POA: Diagnosis not present

## 2019-05-04 DIAGNOSIS — E876 Hypokalemia: Secondary | ICD-10-CM

## 2019-05-04 DIAGNOSIS — C50411 Malignant neoplasm of upper-outer quadrant of right female breast: Secondary | ICD-10-CM | POA: Diagnosis not present

## 2019-05-04 DIAGNOSIS — K219 Gastro-esophageal reflux disease without esophagitis: Secondary | ICD-10-CM | POA: Diagnosis not present

## 2019-05-04 DIAGNOSIS — G4700 Insomnia, unspecified: Secondary | ICD-10-CM | POA: Insufficient documentation

## 2019-05-04 DIAGNOSIS — B0229 Other postherpetic nervous system involvement: Secondary | ICD-10-CM | POA: Insufficient documentation

## 2019-05-04 DIAGNOSIS — Z171 Estrogen receptor negative status [ER-]: Secondary | ICD-10-CM | POA: Insufficient documentation

## 2019-05-04 DIAGNOSIS — R197 Diarrhea, unspecified: Secondary | ICD-10-CM | POA: Diagnosis not present

## 2019-05-04 DIAGNOSIS — Z95828 Presence of other vascular implants and grafts: Secondary | ICD-10-CM

## 2019-05-04 DIAGNOSIS — I5020 Unspecified systolic (congestive) heart failure: Secondary | ICD-10-CM | POA: Insufficient documentation

## 2019-05-04 DIAGNOSIS — N189 Chronic kidney disease, unspecified: Secondary | ICD-10-CM | POA: Diagnosis not present

## 2019-05-04 DIAGNOSIS — Z79899 Other long term (current) drug therapy: Secondary | ICD-10-CM | POA: Insufficient documentation

## 2019-05-04 DIAGNOSIS — K5900 Constipation, unspecified: Secondary | ICD-10-CM | POA: Diagnosis not present

## 2019-05-04 DIAGNOSIS — Z9221 Personal history of antineoplastic chemotherapy: Secondary | ICD-10-CM | POA: Insufficient documentation

## 2019-05-04 DIAGNOSIS — Z5112 Encounter for antineoplastic immunotherapy: Secondary | ICD-10-CM | POA: Insufficient documentation

## 2019-05-04 LAB — CMP (CANCER CENTER ONLY)
ALT: 32 U/L (ref 0–44)
AST: 27 U/L (ref 15–41)
Albumin: 3.9 g/dL (ref 3.5–5.0)
Alkaline Phosphatase: 55 U/L (ref 38–126)
Anion gap: 10 (ref 5–15)
BUN: 11 mg/dL (ref 8–23)
CO2: 27 mmol/L (ref 22–32)
Calcium: 9.1 mg/dL (ref 8.9–10.3)
Chloride: 106 mmol/L (ref 98–111)
Creatinine: 0.7 mg/dL (ref 0.44–1.00)
GFR, Est AFR Am: >60 mL/min (ref >60)
GFR, Estimated: >60 mL/min (ref >60)
Glucose, Bld: 93 mg/dL (ref 70–99)
Potassium: 3.6 mmol/L (ref 3.5–5.1)
Sodium: 143 mmol/L (ref 135–145)
Total Bilirubin: 0.5 mg/dL (ref 0.3–1.2)
Total Protein: 6.4 g/dL — ABNORMAL LOW (ref 6.5–8.1)

## 2019-05-04 LAB — CBC WITH DIFFERENTIAL (CANCER CENTER ONLY)
Abs Immature Granulocytes: 0.01 10*3/uL (ref 0.00–0.07)
Basophils Absolute: 0 10*3/uL (ref 0.0–0.1)
Basophils Relative: 1 %
Eosinophils Absolute: 0.1 10*3/uL (ref 0.0–0.5)
Eosinophils Relative: 1 %
HCT: 33.3 % — ABNORMAL LOW (ref 36.0–46.0)
Hemoglobin: 10.8 g/dL — ABNORMAL LOW (ref 12.0–15.0)
Immature Granulocytes: 0 %
Lymphocytes Relative: 20 %
Lymphs Abs: 0.8 10*3/uL (ref 0.7–4.0)
MCH: 32.2 pg (ref 26.0–34.0)
MCHC: 32.4 g/dL (ref 30.0–36.0)
MCV: 99.4 fL (ref 80.0–100.0)
Monocytes Absolute: 0.4 10*3/uL (ref 0.1–1.0)
Monocytes Relative: 9 %
Neutro Abs: 2.9 10*3/uL (ref 1.7–7.7)
Neutrophils Relative %: 69 %
Platelet Count: 162 10*3/uL (ref 150–400)
RBC: 3.35 MIL/uL — ABNORMAL LOW (ref 3.87–5.11)
RDW: 15.6 % — ABNORMAL HIGH (ref 11.5–15.5)
WBC Count: 4.2 10*3/uL (ref 4.0–10.5)
nRBC: 0 % (ref 0.0–0.2)

## 2019-05-04 MED ORDER — SODIUM CHLORIDE 0.9% FLUSH
10.0000 mL | INTRAVENOUS | Status: DC | PRN
  Administered 2019-05-04: 15:00:00 10 mL
  Filled 2019-05-04: qty 10

## 2019-05-04 MED ORDER — POTASSIUM CHLORIDE CRYS ER 20 MEQ PO TBCR: 20.0000 meq | EXTENDED_RELEASE_TABLET | ORAL | 1 refills | Status: DC

## 2019-05-04 MED ORDER — SODIUM CHLORIDE 0.9 % IV SOLN
840.0000 mg | Freq: Once | INTRAVENOUS | Status: AC
  Administered 2019-05-04: 840 mg via INTRAVENOUS
  Filled 2019-05-04: qty 28

## 2019-05-04 MED ORDER — TRASTUZUMAB-DKST CHEMO 150 MG IV SOLR: 8.0000 mg/kg | Freq: Once | INTRAVENOUS | Status: DC

## 2019-05-04 MED ORDER — SODIUM CHLORIDE 0.9% FLUSH
10.0000 mL | Freq: Once | INTRAVENOUS | Status: AC
  Administered 2019-05-04: 11:00:00 10 mL
  Filled 2019-05-04: qty 10

## 2019-05-04 MED ORDER — DIPHENHYDRAMINE HCL 25 MG PO CAPS
ORAL_CAPSULE | ORAL | Status: AC
  Filled 2019-05-04: qty 2

## 2019-05-04 MED ORDER — TRASTUZUMAB-ANNS CHEMO 150 MG IV SOLR
8.0000 mg/kg | Freq: Once | INTRAVENOUS | Status: AC
  Administered 2019-05-04: 14:00:00 357 mg via INTRAVENOUS
  Filled 2019-05-04: qty 17

## 2019-05-04 MED ORDER — HEPARIN SOD (PORK) LOCK FLUSH 100 UNIT/ML IV SOLN
500.0000 [IU] | Freq: Once | INTRAVENOUS | Status: AC | PRN
  Administered 2019-05-04: 500 [IU]
  Filled 2019-05-04: qty 5

## 2019-05-04 MED ORDER — DIPHENHYDRAMINE HCL 25 MG PO CAPS
50.0000 mg | ORAL_CAPSULE | Freq: Once | ORAL | Status: AC
  Administered 2019-05-04: 13:00:00 50 mg via ORAL

## 2019-05-04 MED ORDER — SODIUM CHLORIDE 0.9 % IV SOLN
Freq: Once | INTRAVENOUS | Status: AC
  Administered 2019-05-04: 13:00:00 via INTRAVENOUS
  Filled 2019-05-04: qty 250

## 2019-05-04 MED ORDER — ACETAMINOPHEN 325 MG PO TABS
ORAL_TABLET | ORAL | Status: AC
  Filled 2019-05-04: qty 2

## 2019-05-04 MED ORDER — ACETAMINOPHEN 325 MG PO TABS
650.0000 mg | ORAL_TABLET | Freq: Once | ORAL | Status: AC
  Administered 2019-05-04: 13:00:00 650 mg via ORAL

## 2019-05-04 NOTE — Patient Instructions (Signed)

## 2019-05-04 NOTE — Progress Notes (Signed)
Patient declined to stay for post-observation period after Perjeta

## 2019-05-04 NOTE — Patient Instructions (Signed)
[facility] Discharge Instructions for Patients Receiving Chemotherapy  Today you received the following chemotherapy agents Trastuzumab, Perjeta  To help prevent nausea and vomiting after your treatment, we encourage you to take your nausea medication as directed.   If you develop nausea and vomiting that is not controlled by your nausea medication, call the clinic.   BELOW ARE SYMPTOMS THAT SHOULD BE REPORTED IMMEDIATELY:  *FEVER GREATER THAN 100.5 F  *CHILLS WITH OR WITHOUT FEVER  NAUSEA AND VOMITING THAT IS NOT CONTROLLED WITH YOUR NAUSEA MEDICATION  *UNUSUAL SHORTNESS OF BREATH  *UNUSUAL BRUISING OR BLEEDING  TENDERNESS IN MOUTH AND THROAT WITH OR WITHOUT PRESENCE OF ULCERS  *URINARY PROBLEMS  *BOWEL PROBLEMS  UNUSUAL RASH Items with * indicate a potential emergency and should be followed up as soon as possible.  Feel free to call the clinic should you have any questions or concerns. The clinic phone number is [phone].  Please show the [place] at check-in to the Emergency Department and triage nurse.

## 2019-05-05 ENCOUNTER — Telehealth: Payer: Self-pay | Admitting: Hematology

## 2019-05-05 ENCOUNTER — Encounter: Payer: Self-pay | Admitting: General Practice

## 2019-05-05 NOTE — Telephone Encounter (Signed)
I could not reach patient regarding schedule and referral was already scheduled

## 2019-05-05 NOTE — Progress Notes (Signed)
[place] Spiritual Care Note  Reached Christina Sexton by phone per referral from [facility] Burton/NP. Christina Sexton notes that she's feeling better today than she had been recently, and she's "taking it day by day with God's help." Provided empathic listening, normalization of feelings, introduction to Spiritual Care, and encouragement to phone anytime. She plans to connect as needed/desired.   [place], [place], [facility] Pager [phone] Voicemail [phone]

## 2019-05-06 ENCOUNTER — Encounter: Payer: Self-pay | Admitting: General Practice

## 2019-05-06 NOTE — Progress Notes (Signed)
[place] CSW Progress Notes  Second [employer] distribution left for patient to pick up at front desk during tomorrow's appointment.  Patient asked to call to confirm receipt.  Edwyna Shell, LCSW Clinical Social Worker Phone:  [phone]

## 2019-05-07 ENCOUNTER — Other Ambulatory Visit: Payer: Self-pay

## 2019-05-07 ENCOUNTER — Inpatient Hospital Stay: Payer: Medicare HMO

## 2019-05-07 VITALS — BP 133/78 | HR 88 | Resp 17

## 2019-05-07 DIAGNOSIS — Z171 Estrogen receptor negative status [ER-]: Secondary | ICD-10-CM | POA: Diagnosis not present

## 2019-05-07 DIAGNOSIS — C50411 Malignant neoplasm of upper-outer quadrant of right female breast: Secondary | ICD-10-CM | POA: Diagnosis not present

## 2019-05-07 DIAGNOSIS — I129 Hypertensive chronic kidney disease with stage 1 through stage 4 chronic kidney disease, or unspecified chronic kidney disease: Secondary | ICD-10-CM | POA: Diagnosis not present

## 2019-05-07 DIAGNOSIS — G4700 Insomnia, unspecified: Secondary | ICD-10-CM | POA: Diagnosis not present

## 2019-05-07 DIAGNOSIS — K219 Gastro-esophageal reflux disease without esophagitis: Secondary | ICD-10-CM | POA: Diagnosis not present

## 2019-05-07 DIAGNOSIS — N189 Chronic kidney disease, unspecified: Secondary | ICD-10-CM | POA: Diagnosis not present

## 2019-05-07 DIAGNOSIS — Z95828 Presence of other vascular implants and grafts: Secondary | ICD-10-CM

## 2019-05-07 DIAGNOSIS — Z79899 Other long term (current) drug therapy: Secondary | ICD-10-CM | POA: Diagnosis not present

## 2019-05-07 DIAGNOSIS — K5900 Constipation, unspecified: Secondary | ICD-10-CM | POA: Diagnosis not present

## 2019-05-07 DIAGNOSIS — B0229 Other postherpetic nervous system involvement: Secondary | ICD-10-CM | POA: Diagnosis not present

## 2019-05-07 MED ORDER — SODIUM CHLORIDE 0.9% FLUSH
10.0000 mL | Freq: Once | INTRAVENOUS | Status: AC | PRN
  Administered 2019-05-07: 10 mL
  Filled 2019-05-07: qty 10

## 2019-05-07 MED ORDER — HEPARIN SOD (PORK) LOCK FLUSH 100 UNIT/ML IV SOLN
500.0000 [IU] | Freq: Once | INTRAVENOUS | Status: AC | PRN
  Administered 2019-05-07: 13:00:00 500 [IU]
  Filled 2019-05-07: qty 5

## 2019-05-07 MED ORDER — SODIUM CHLORIDE 0.9 % IV SOLN
Freq: Once | INTRAVENOUS | Status: AC
  Administered 2019-05-07: 11:00:00 via INTRAVENOUS
  Filled 2019-05-07: qty 250

## 2019-05-07 NOTE — Patient Instructions (Signed)
Rehydration, Adult Rehydration is the replacement of body fluids and salts and minerals (electrolytes) that are lost during dehydration. Dehydration is when there is not enough fluid or water in the body. This happens when you lose more fluids than you take in. Common causes of dehydration include:  Vomiting.  Diarrhea.  Excessive sweating, such as from heat exposure or exercise.  Taking medicines that cause the body to lose excess fluid (diuretics).  Impaired kidney function.  Not drinking enough fluid.  Certain illnesses or infections.  Certain poorly controlled long-term (chronic) illnesses, such as diabetes, heart disease, and kidney disease.  Symptoms of mild dehydration may include thirst, dry lips and mouth, dry skin, and dizziness. Symptoms of severe dehydration may include increased heart rate, confusion, fainting, and not urinating. You can rehydrate by drinking certain fluids or getting fluids through an IV tube, as told by your health care provider. What are the risks? Generally, rehydration is safe. However, one problem that can happen is taking in too much fluid (overhydration). This is rare. If overhydration happens, it can cause an electrolyte imbalance, kidney failure, or a decrease in salt (sodium) levels in the body. How to rehydrate Follow instructions from your health care provider for rehydration. The kind of fluid you should drink and the amount you should drink depend on your condition.  If directed by your health care provider, drink an oral rehydration solution (ORS). This is a drink designed to treat dehydration that is found in pharmacies and retail stores. ? Make an ORS by following instructions on the package. ? Start by drinking small amounts, about  cup (120 mL) every 5-10 minutes. ? Slowly increase how much you drink until you have taken the amount recommended by your health care provider.  Drink enough clear fluids to keep your urine clear or pale  yellow. If you were instructed to drink an ORS, finish the ORS first, then start slowly drinking other clear fluids. Drink fluids such as: ? Water. Do not drink only water. Doing that can lead to having too little sodium in your body (hyponatremia). ? Ice chips. ? Fruit juice that you have added water to (diluted juice). ? Low-calorie sports drinks.  If you are severely dehydrated, your health care provider may recommend that you receive fluids through an IV tube in the hospital.  Do not take sodium tablets. Doing that can lead to the condition of having too much sodium in your body (hypernatremia). Eating while you rehydrate Follow instructions from your health care provider about what to eat while you rehydrate. Your health care provider may recommend that you slowly begin eating regular foods in small amounts.  Eat foods that contain a healthy balance of electrolytes, such as bananas, oranges, potatoes, tomatoes, and spinach.  Avoid foods that are greasy or contain a lot of fat or sugar.  In some cases, you may get nutrition through a feeding tube that is passed through your nose and into your stomach (nasogastric tube, or NG tube). This may be done if you have uncontrolled vomiting or diarrhea. Beverages to avoid Certain beverages may make dehydration worse. While you rehydrate, avoid:  Alcohol.  Caffeine.  Drinks that contain a lot of sugar. These include: ? High-calorie sports drinks. ? Fruit juice that is not diluted. ? Soda.  Check nutrition labels to see how much sugar or caffeine a beverage contains. Signs of dehydration recovery You may be recovering from dehydration if:  You are urinating more often than before you started   rehydrating.  Your urine is clear or pale yellow.  Your energy level improves.  You vomit less frequently.  You have diarrhea less frequently.  Your appetite improves or returns to normal.  You feel less dizzy or less light-headed.  Your  skin tone and color start to look more normal. Contact a health care provider if:  You continue to have symptoms of mild dehydration, such as: ? Thirst. ? Dry lips. ? Slightly dry mouth. ? Dry, warm skin. ? Dizziness.  You continue to vomit or have diarrhea. Get help right away if:  You have symptoms of dehydration that get worse.  You feel: ? Confused. ? Weak. ? Like you are going to faint.  You have not urinated in 6-8 hours.  You have very dark urine.  You have trouble breathing.  Your heart rate while sitting still is over 100 beats a minute.  You cannot drink fluids without vomiting.  You have vomiting or diarrhea that: ? Gets worse. ? Does not go away.  You have a fever. This information is not intended to replace advice given to you by your health care provider. Make sure you discuss any questions you have with your health care provider. Document Released: 09/09/2011 Document Revised: 05/30/2017 Document Reviewed: 08/11/2015 Elsevier Patient Education  2020 [employer]  Coronavirus (COVID-19) Are you at risk?  Are you at risk for the Coronavirus (COVID-19)?  To be considered HIGH RISK for Coronavirus (COVID-19), you have to meet the following criteria:  . Traveled to [place], [place], [place], [place] or [place]; or in the [place] to [place], [place], [place], or [place]; and have fever, cough, and shortness of breath within the last 2 weeks of travel OR . Been in close contact with a person diagnosed with COVID-19 within the last 2 weeks and have fever, cough, and shortness of breath . IF YOU DO NOT MEET THESE CRITERIA, YOU ARE CONSIDERED LOW RISK FOR COVID-19.  What to do if you are HIGH RISK for COVID-19?  . If you are having a medical emergency, call 911. . Seek medical care right away. Before you go to a doctor's office, urgent care or emergency department, call ahead and tell them about your recent travel, contact with someone diagnosed  with COVID-19, and your symptoms. You should receive instructions from your physician's office regarding next steps of care.  . When you arrive at healthcare provider, tell the healthcare staff immediately you have returned from visiting [place], [place], [place], [place] or [place]; or traveled in the [place] to [place], [place], [place], or [place]; in the last two weeks or you have been in close contact with a person diagnosed with COVID-19 in the last 2 weeks.   . Tell the health care staff about your symptoms: fever, cough and shortness of breath. . After you have been seen by a medical provider, you will be either: o Tested for (COVID-19) and discharged home on quarantine except to seek medical care if symptoms worsen, and asked to  - Stay home and avoid contact with others until you get your results (4-5 days)  - Avoid travel on public transportation if possible (such as bus, train, or airplane) or o Sent to the Emergency Department by EMS for evaluation, COVID-19 testing, and possible admission depending on your condition and test results.  What to do if you are LOW RISK for COVID-19?  Reduce your risk of any infection by using the same precautions   used for avoiding the common cold or flu:  . Wash your hands often with soap and warm water for at least 20 seconds.  If soap and water are not readily available, use an alcohol-based hand sanitizer with at least 60% alcohol.  . If coughing or sneezing, cover your mouth and nose by coughing or sneezing into the elbow areas of your shirt or coat, into a tissue or into your sleeve (not your hands). . Avoid shaking hands with others and consider head nods or verbal greetings only. . Avoid touching your eyes, nose, or mouth with unwashed hands.  . Avoid close contact with people who are sick. . Avoid places or events with large numbers of people in one location, like concerts or sporting events. . Carefully consider travel plans you have  or are making. . If you are planning any travel outside or inside the [place], visit the CDC's Travelers' Health webpage for the latest health notices. . If you have some symptoms but not all symptoms, continue to monitor at home and seek medical attention if your symptoms worsen. . If you are having a medical emergency, call 911.   ADDITIONAL HEALTHCARE OPTIONS FOR PATIENTS  [facility] / e-Visit: [email or web address]         [facility]: [phone]  [facility]: [phone]                   [facility]: [phone]   

## 2019-05-08 ENCOUNTER — Other Ambulatory Visit: Payer: Self-pay | Admitting: Hematology

## 2019-05-13 NOTE — Progress Notes (Signed)
[id number] - [place], [place] [address] [place] [address] Phone: [phone] Fax: [phone]      Your procedure is scheduled on Wednesday, Nov. 18th.  Report to [facility] Entr[facility] "A" at 1:00 P.M., and check in at the Admitting office.  Call this number if you have problems the morning of surgery:  [phone]  Call [phone] if you have any questions prior to your surgery date Monday-Friday 8am-4pm    Remember:  Do not eat after midnight the night before your surgery  You may drink clear liquids until 12:00 P.M. the morning of your surgery.   Clear liquids allowed are: Water, Non-Citrus Juices (without pulp), Carbonated Beverages, Clear Tea, Black Coffee Only, and Gatorade    Take these medicines the morning of surgery with A SIP OF WATER   Atorvastatin (Lipitor)  Zofran - if needed  Protonix   7 days prior to surgery STOP taking any Aspirin (unless otherwise instructed by your surgeon), Aleve, Naproxen, Ibuprofen, Motrin, Advil, Goody's, BC's, all herbal medications, fish oil, and all vitamins.    The Morning of Surgery  Do not wear jewelry, make-up or nail polish.  Do not wear lotions, powders, or perfumes, or deodorant  Do not shave 48 hours prior to surgery.    Do not bring valuables to the hospital.  [facility] is not responsible for any belongings or valuables.  If you are a smoker, DO NOT Smoke 24 hours prior to surgery IF you wear a CPAP at night please bring your mask, tubing, and machine the morning of surgery   Remember that you must have someone to transport you home after your surgery, and remain with you for 24 hours if you are discharged the same day.   Contacts, glasses, hearing aids, dentures or bridgework may not be worn into surgery.    Leave your suitcase in the car.  After surgery it may be brought to your room.  For patients admitted to the hospital,  discharge time will be determined by your treatment team.  Patients discharged the day of surgery will not be allowed to drive home.    Special instructions:   [facility]- Preparing For Surgery  Before surgery, you can play an important role. Because skin is not sterile, your skin needs to be as free of germs as possible. You can reduce the number of germs on your skin by washing with CHG (chlorahexidine gluconate) Soap before surgery.  CHG is an antiseptic cleaner which kills germs and bonds with the skin to continue killing germs even after washing.    Oral Hygiene is also important to reduce your risk of infection.  Remember - BRUSH YOUR TEETH THE MORNING OF SURGERY WITH YOUR REGULAR TOOTHPASTE  Please do not use if you have an allergy to CHG or antibacterial soaps. If your skin becomes reddened/irritated stop using the CHG.  Do not shave (including legs and underarms) for at least 48 hours prior to first CHG shower. It is OK to shave your face.  Please follow these instructions carefully.   1. Shower the NIGHT BEFORE SURGERY and the MORNING OF SURGERY with CHG Soap.   2. If you chose to wash your hair, wash your hair first as usual with your normal shampoo.  3. After you shampoo, rinse your hair and body thoroughly to remove the shampoo.  4. Use CHG as you would any other liquid soap. You can apply  CHG directly to the skin and wash gently with a scrungie or a clean washcloth.   5. Apply the CHG Soap to your body ONLY FROM THE NECK DOWN.  Do not use on open wounds or open sores. Avoid contact with your eyes, ears, mouth and genitals (private parts). Wash Face and genitals (private parts)  with your normal soap.   6. Wash thoroughly, paying special attention to the area where your surgery will be performed.  7. Thoroughly rinse your body with warm water from the neck down.  8. DO NOT shower/wash with your normal soap after using and rinsing off the CHG Soap.  9. Pat yourself dry  with a CLEAN TOWEL.  10. Wear CLEAN PAJAMAS to bed the night before surgery, wear comfortable clothes the morning of surgery  11. Place CLEAN SHEETS on your bed the night of your first shower and DO NOT SLEEP WITH PETS.    Day of Surgery:  Do not apply any deodorants/lotions. Please shower the morning of surgery with the CHG soap  Please wear clean clothes to the hospital/surgery center.   Remember to brush your teeth WITH YOUR REGULAR TOOTHPASTE.   Please read over the following fact sheets that you were given.

## 2019-05-14 ENCOUNTER — Encounter (HOSPITAL_COMMUNITY)
Admission: RE | Admit: 2019-05-14 | Discharge: 2019-05-14 | Disposition: A | Discharge status: Home / Self Care | Payer: Medicare HMO | Source: Ambulatory Visit | Attending: General Surgery | Admitting: General Surgery

## 2019-05-14 ENCOUNTER — Encounter (HOSPITAL_COMMUNITY): Payer: Self-pay

## 2019-05-14 ENCOUNTER — Other Ambulatory Visit: Payer: Self-pay

## 2019-05-14 DIAGNOSIS — C50911 Malignant neoplasm of unspecified site of right female breast: Secondary | ICD-10-CM | POA: Diagnosis not present

## 2019-05-14 DIAGNOSIS — Z01812 Encounter for preprocedural laboratory examination: Secondary | ICD-10-CM | POA: Insufficient documentation

## 2019-05-14 HISTORY — DX: Personal history of urinary calculi: Z87.442

## 2019-05-14 LAB — BASIC METABOLIC PANEL
Anion gap: 9 (ref 5–15)
BUN: 11 mg/dL (ref 8–23)
CO2: 27 mmol/L (ref 22–32)
Calcium: 9.7 mg/dL (ref 8.9–10.3)
Chloride: 107 mmol/L (ref 98–111)
Creatinine, Ser: 0.69 mg/dL (ref 0.44–1.00)
GFR calc Af Amer: >60 mL/min (ref >60)
GFR calc non Af Amer: >60 mL/min (ref >60)
Glucose, Bld: 107 mg/dL — ABNORMAL HIGH (ref 70–99)
Potassium: 3.9 mmol/L (ref 3.5–5.1)
Sodium: 143 mmol/L (ref 135–145)

## 2019-05-14 LAB — CBC
HCT: 39.1 % (ref 36.0–46.0)
Hemoglobin: 12.3 g/dL (ref 12.0–15.0)
MCH: 32.3 pg (ref 26.0–34.0)
MCHC: 31.5 g/dL (ref 30.0–36.0)
MCV: 102.6 fL — ABNORMAL HIGH (ref 80.0–100.0)
Platelets: 170 10*3/uL (ref 150–400)
RBC: 3.81 MIL/uL — ABNORMAL LOW (ref 3.87–5.11)
RDW: 14.5 % (ref 11.5–15.5)
WBC: 3 10*3/uL — ABNORMAL LOW (ref 4.0–10.5)
nRBC: 0 % (ref 0.0–0.2)

## 2019-05-14 NOTE — Progress Notes (Signed)
PCP - Deland Pretty Cardiologist - denies  Chest x-ray - 12-28-18 (1 view) EKG - 12-28-18 ECHO - 04-15-19  ERAS Protcol - yes, no drink ordered or given  COVID TEST- Saturday, Nov. 14th   Anesthesia review: yes, radioactive seed  Patient denies shortness of breath, fever, cough and chest pain at PAT appointment   All instructions explained to the patient, with a verbal understanding of the material. Patient agrees to go over the instructions while at home for a better understanding. Patient also instructed to self quarantine after being tested for COVID-19. The opportunity to ask questions was provided.

## 2019-05-15 ENCOUNTER — Other Ambulatory Visit (HOSPITAL_COMMUNITY)
Admission: RE | Admit: 2019-05-15 | Discharge: 2019-05-15 | Disposition: A | Discharge status: Home / Self Care | Payer: Medicare HMO | Source: Ambulatory Visit | Attending: General Surgery | Admitting: General Surgery

## 2019-05-15 DIAGNOSIS — Z20828 Contact with and (suspected) exposure to other viral communicable diseases: Secondary | ICD-10-CM | POA: Diagnosis not present

## 2019-05-15 DIAGNOSIS — Z01812 Encounter for preprocedural laboratory examination: Secondary | ICD-10-CM | POA: Diagnosis not present

## 2019-05-16 LAB — NOVEL CORONAVIRUS, NAA (HOSP ORDER, SEND-OUT TO REF LAB; TAT 18-24 HRS): SARS-CoV-2, NAA: NOT DETECTED

## 2019-05-17 NOTE — H&P (Signed)
[place] Location: [facility] Patient #: [id number] DOB: 04/07/1951 Single / Language: Undefined / Race: Refused to Report/Unreported Female   History of Present Illness The patient is a 68 year old female who presents for a follow-up for Breast cancer. Pt is a 69 yo F who presents with new right breast cancer dx 11/2018. She had a palpable mass for around 3 months. She felt that it was growing. She was eventually able to get dx imaging. This showed a 3 cm mass at 11 o'clock. The mass was firm to her touch. She denied any significant pain in the mass region. She had a core needle biopsy showing a grade 3 invasive ductal carcinoma, ER/PR -, Her 2 positive. Ki 67 was 30%. She had a benign excisional biopsy in the past, but has not had cancer before. She had a brother with colon cancer. She is a non smoker. She had menarche at age 38. She had her last period 10 years ago. She never used HRT. She is nulliparous. She is up to date with bone scan, colonoscopy, and PAP smear.    Pt has had a difficult time wtih her chemotherapy. She completed 5 courses and had additive side effects. She is also a bit upset because she did not realize that herceptin was 1 year of treatment. She has had resolution of palpable mass and resolution of tumor on MRI. She has had quite a bit of weight loss.   Breast MRI 04/14/2019 CLINICAL DATA: 68 year old female with biopsy proven right breast cancer and metastatic lymph node presents for re-evaluation after neoadjuvant chemotherapy.  LABS: None performed today on site.  EXAM: BILATERAL BREAST MRI WITH AND WITHOUT CONTRAST  TECHNIQUE: Multiplanar, multisequence MR images of both breasts were obtained prior to and following the intravenous administration of 4 ml of Gadavist.  Three-dimensional MR images were rendered by post-processing of the original MR data on an independent workstation. The three-dimensional MR images  were interpreted, and findings are reported in the following complete MRI report for this study. Three dimensional images were evaluated at the independent DynaCad workstation  COMPARISON: Previous exam(s).  FINDINGS: Breast composition: d. Extreme fibroglandular tissue.  Background parenchymal enhancement: Mild.  Right breast: Susceptibility artifact from post biopsy tissue markers are demonstrated in the superior central and superolateral right breast at posterior depth. These are consistent with a two sites of biopsy-proven malignancy. There has been interval resolution of the associated enhancing masses. No new or suspicious enhancement is identified.  Left breast: No mass or abnormal enhancement.  Lymph nodes: No abnormal appearing lymph nodes.  Ancillary findings: None.  IMPRESSION: 1. Interval resolution of the 2 enhancing masses corresponding with biopsy proven malignancy in the upper right breast. There is no residual or new, suspicious enhancement. 2. No MRI evidence of malignancy in the left breast.  RECOMMENDATION: Per treatment plan.  BI-RADS CATEGORY 6: Known biopsy-proven malignancy.   Allergies No Known Allergies  Medication History  valACYclovir HCl (1GM Tablet, Oral) Active. Dexamethasone (4MG  Tablet, Oral) Active. Atorvastatin Calcium (20MG  Tablet, Oral) Active. Pantoprazole Sodium (20MG  Tablet DR, Oral) Active. Lipitor (40MG  Tablet, Oral) Active. MiraLax (Oral) Active. Medications Reconciled    Review of Systems All other systems negative  Vitals Weight: 96.8 lb Height: 62in Body Surface Area: 1.4 m Body Mass Index: 17.7 kg/m  Temp.: 63F(Oral)  Pulse: 99 (Regular)  BP: 134/72 (Sitting, Left Arm, Standard)       Physical Exam  General Mental Status-Alert. General Appearance-Consistent with stated age.  Hydration-Well hydrated. Voice-Normal. Note: very thin.   Head and  Neck Head-normocephalic, atraumatic with no lesions or palpable masses. Note: alopecia   Eye Sclera/Conjunctiva - Bilateral-No scleral icterus.  Chest and Lung Exam Chest and lung exam reveals -quiet, even and easy respiratory effort with no use of accessory muscles. Inspection Chest Wall - Normal. Back - normal.  Breast Note: no palpable mass or nipple retraction. no LAD. no nipple discharge or skin dimpling. breasts symmetric. relatively dense breast tissue bilaterally.   Cardiovascular Cardiovascular examination reveals -normal pedal pulses bilaterally. Note: regular rate and rhythm  Abdomen Inspection-Inspection Normal. Palpation/Percussion Palpation and Percussion of the abdomen reveal - Soft, Non Tender, No Rebound tenderness, No Rigidity (guarding) and No hepatosplenomegaly.  Peripheral Vascular Upper Extremity Inspection - Bilateral - Normal - No Clubbing, No Cyanosis, No Edema, Pulses Intact. Lower Extremity Palpation - Edema - Bilateral - No edema - Bilateral.  Neurologic Neurologic evaluation reveals -alert and oriented x 3 with no impairment of recent or remote memory. Mental Status-Normal.  Musculoskeletal Global Assessment -Note: no gross deformities.  Normal Exam - Left-Upper Extremity Strength Normal and Lower Extremity Strength Normal. Normal Exam - Right-Upper Extremity Strength Normal and Lower Extremity Strength Normal.  Lymphatic Head & Neck  General Head & Neck Lymphatics: Bilateral - Description - Normal. Axillary  General Axillary Region: Bilateral - Description - Normal. Tenderness - Non Tender.    Assessment & Plan  MALIGNANT NEOPLASM OF UPPER-OUTER QUADRANT OF RIGHT BREAST IN FEMALE, ESTROGEN RECEPTOR NEGATIVE (C50.411) Impression: Original MR showed second area of cancer. Will do seed localized lumpectomy x 2 and sentinel node biopsy.  The surgical procedure was described to the patient. I discussed the  incision type and location and that we would need radiology involved on with a wire or seed marker and/or sentinel node.  The risks and benefits of the procedure were described to the patient and she wishes to proceed.  We discussed the risks bleeding, infection, damage to other structures, need for further procedures/surgeries. We discussed the risk of seroma. The patient was advised if the area in the breast in cancer, we may need to go back to surgery for additional tissue to obtain negative margins or for a lymph node biopsy. The patient was advised that these are the most common complications, but that others can occur as well. They were advised against taking aspirin or other anti-inflammatory agents/blood thinners the week before surgery. Current Plans Pt Education - [facility] breast cancer surgery: discussed with patient and provided information. You are being scheduled for surgery- Our schedulers will call you.  You should hear from our office's scheduling department within 5 working days about the location, date, and time of surgery. We try to make accommodations for patient's preferences in scheduling surgery, but sometimes the OR schedule or the surgeon's schedule prevents [place] from making those accommodations.  If you have not heard from our office [phone]) in 5 working days, call the office and ask for your surgeon's nurse.  If you have other questions about your diagnosis, plan, or surgery, call the office and ask for your surgeon's nurse.

## 2019-05-17 NOTE — Anesthesia Preprocedure Evaluation (Addendum)
Anesthesia Evaluation  Patient identified by MRN, date of birth, ID band Patient awake    Reviewed: Allergy & Precautions, NPO status , Patient's Chart, lab work & pertinent test results  Airway Mallampati: I  TM Distance: >3 FB Neck ROM: Full    Dental no notable dental hx.    Pulmonary asthma ,  Well controlled.  Has had chronic cough since starting chemo   Pulmonary exam normal breath sounds clear to auscultation       Cardiovascular hypertension, Pt. on medications Normal cardiovascular exam Rhythm:Regular Rate:Normal  TTE 04/15/19:  1. Left ventricular ejection fraction, by visual estimation, is 55 to 60%. The left ventricle has normal function. Normal left ventricular size. There is no left ventricular hypertrophy.  2. Compared with the echo [id number], systolic function is slightly worse, especially in the anteroseptum. However overall strain is slightly better.  3. Global right ventricle has normal systolic function.The right ventricular size is normal. No increase in right ventricular wall thickness.  4. Left atrial size was normal.  5. Right atrial size was normal.  6. Mild ascites is present.  7. The mitral valve is normal in structure. Mild mitral valve regurgitation. No evidence of mitral stenosis.  8. The tricuspid valve is normal in structure. Tricuspid valve regurgitation is mild.  9. The aortic valve is normal in structure. Aortic valve regurgitation was not visualized by color flow Doppler. Structurally normal aortic valve, with no evidence of sclerosis or stenosis. 10. The pulmonic valve was normal in structure. Pulmonic valve regurgitation is not visualized by color flow Doppler.   Neuro/Psych negative neurological ROS  negative psych ROS   GI/Hepatic Neg liver ROS, GERD  Medicated and Controlled,  Endo/Other  negative endocrine ROS  Renal/GU negative Renal ROSHx of CKD but Cr 0.6  negative genitourinary    Musculoskeletal negative musculoskeletal ROS (+)   Abdominal   Peds  Hematology negative hematology ROS (+) Hx of anemia but Hct 39.1   Anesthesia Other Findings HLD  Reproductive/Obstetrics negative OB ROS                            Anesthesia Physical Anesthesia Plan  ASA: II  Anesthesia Plan: General and Regional   Post-op Pain Management:  Regional for Post-op pain   Induction:   PONV Risk Score and Plan: 3 and Ondansetron, Dexamethasone, Midazolam and Treatment may vary due to age or medical condition  Airway Management Planned: LMA  Additional Equipment: None  Intra-op Plan:   Post-operative Plan: Extubation in OR  Informed Consent: I have reviewed the patients History and Physical, chart, labs and discussed the procedure including the risks, benefits and alternatives for the proposed anesthesia with the patient or authorized representative who has indicated his/her understanding and acceptance.     Dental advisory given  Plan Discussed with:   Anesthesia Plan Comments:        Anesthesia Quick Evaluation

## 2019-05-18 ENCOUNTER — Ambulatory Visit
Admission: RE | Admit: 2019-05-18 | Discharge: 2019-05-18 | Disposition: A | Discharge status: Home / Self Care | Payer: Medicare HMO | Source: Ambulatory Visit | Attending: General Surgery | Admitting: General Surgery

## 2019-05-18 ENCOUNTER — Other Ambulatory Visit: Payer: Self-pay

## 2019-05-18 ENCOUNTER — Ambulatory Visit: Admission: RE | Admit: 2019-05-18 | Payer: Medicare HMO | Source: Ambulatory Visit

## 2019-05-18 DIAGNOSIS — Z171 Estrogen receptor negative status [ER-]: Secondary | ICD-10-CM

## 2019-05-18 DIAGNOSIS — C50411 Malignant neoplasm of upper-outer quadrant of right female breast: Secondary | ICD-10-CM

## 2019-05-18 DIAGNOSIS — R928 Other abnormal and inconclusive findings on diagnostic imaging of breast: Secondary | ICD-10-CM | POA: Diagnosis not present

## 2019-05-19 ENCOUNTER — Ambulatory Visit
Admission: RE | Admit: 2019-05-19 | Discharge: 2019-05-19 | Disposition: A | Discharge status: Home / Self Care | Payer: Medicare HMO | Source: Ambulatory Visit | Attending: General Surgery | Admitting: General Surgery

## 2019-05-19 ENCOUNTER — Ambulatory Visit (HOSPITAL_COMMUNITY)
Admission: RE | Admit: 2019-05-19 | Discharge: 2019-05-19 | Disposition: A | Discharge status: Home / Self Care | Payer: Medicare HMO | Attending: General Surgery | Admitting: General Surgery

## 2019-05-19 ENCOUNTER — Encounter (HOSPITAL_COMMUNITY)
Admission: RE | Admit: 2019-05-19 | Discharge: 2019-05-19 | Disposition: A | Discharge status: Home / Self Care | Payer: Medicare HMO | Source: Ambulatory Visit | Attending: General Surgery | Admitting: General Surgery

## 2019-05-19 ENCOUNTER — Other Ambulatory Visit: Payer: Self-pay

## 2019-05-19 ENCOUNTER — Ambulatory Visit (HOSPITAL_COMMUNITY): Payer: Medicare HMO | Admitting: Anesthesiology

## 2019-05-19 ENCOUNTER — Encounter (HOSPITAL_COMMUNITY): Payer: Self-pay | Admitting: Orthopedic Surgery

## 2019-05-19 ENCOUNTER — Encounter (HOSPITAL_COMMUNITY)
Admission: RE | Disposition: A | Discharge status: Home / Self Care | Payer: Self-pay | Source: Home / Self Care | Attending: General Surgery

## 2019-05-19 ENCOUNTER — Ambulatory Visit (HOSPITAL_COMMUNITY): Payer: Medicare HMO | Admitting: Physician Assistant

## 2019-05-19 DIAGNOSIS — C50411 Malignant neoplasm of upper-outer quadrant of right female breast: Secondary | ICD-10-CM | POA: Insufficient documentation

## 2019-05-19 DIAGNOSIS — C50911 Malignant neoplasm of unspecified site of right female breast: Secondary | ICD-10-CM | POA: Diagnosis not present

## 2019-05-19 DIAGNOSIS — E785 Hyperlipidemia, unspecified: Secondary | ICD-10-CM | POA: Insufficient documentation

## 2019-05-19 DIAGNOSIS — I129 Hypertensive chronic kidney disease with stage 1 through stage 4 chronic kidney disease, or unspecified chronic kidney disease: Secondary | ICD-10-CM | POA: Diagnosis not present

## 2019-05-19 DIAGNOSIS — Z79899 Other long term (current) drug therapy: Secondary | ICD-10-CM | POA: Diagnosis not present

## 2019-05-19 DIAGNOSIS — N189 Chronic kidney disease, unspecified: Secondary | ICD-10-CM | POA: Insufficient documentation

## 2019-05-19 DIAGNOSIS — Z171 Estrogen receptor negative status [ER-]: Secondary | ICD-10-CM | POA: Diagnosis not present

## 2019-05-19 DIAGNOSIS — J45909 Unspecified asthma, uncomplicated: Secondary | ICD-10-CM | POA: Diagnosis not present

## 2019-05-19 DIAGNOSIS — C50919 Malignant neoplasm of unspecified site of unspecified female breast: Secondary | ICD-10-CM | POA: Diagnosis not present

## 2019-05-19 DIAGNOSIS — K219 Gastro-esophageal reflux disease without esophagitis: Secondary | ICD-10-CM | POA: Diagnosis not present

## 2019-05-19 DIAGNOSIS — Z9221 Personal history of antineoplastic chemotherapy: Secondary | ICD-10-CM | POA: Diagnosis not present

## 2019-05-19 DIAGNOSIS — R928 Other abnormal and inconclusive findings on diagnostic imaging of breast: Secondary | ICD-10-CM | POA: Diagnosis not present

## 2019-05-19 DIAGNOSIS — C773 Secondary and unspecified malignant neoplasm of axilla and upper limb lymph nodes: Secondary | ICD-10-CM | POA: Diagnosis not present

## 2019-05-19 DIAGNOSIS — I10 Essential (primary) hypertension: Secondary | ICD-10-CM | POA: Diagnosis not present

## 2019-05-19 DIAGNOSIS — G8918 Other acute postprocedural pain: Secondary | ICD-10-CM | POA: Diagnosis not present

## 2019-05-19 HISTORY — PX: BREAST LUMPECTOMY WITH RADIOACTIVE SEED AND SENTINEL LYMPH NODE BIOPSY: SHX6550

## 2019-05-19 SURGERY — BREAST LUMPECTOMY WITH RADIOACTIVE SEED AND SENTINEL LYMPH NODE BIOPSY
Anesthesia: Regional | Site: Breast | Laterality: Right

## 2019-05-19 MED ORDER — FENTANYL CITRATE (PF) 250 MCG/5ML IJ SOLN
INTRAMUSCULAR | Status: AC
  Filled 2019-05-19: qty 5

## 2019-05-19 MED ORDER — LACTATED RINGERS IV SOLN
INTRAVENOUS | Status: DC | PRN
  Administered 2019-05-19 (×2): via INTRAVENOUS

## 2019-05-19 MED ORDER — SODIUM CHLORIDE (PF) 0.9 % IJ SOLN
INTRAVENOUS | Status: DC | PRN
  Administered 2019-05-19: 5 mL via INTRAMUSCULAR

## 2019-05-19 MED ORDER — PHENYLEPHRINE HCL (PRESSORS) 10 MG/ML IV SOLN
INTRAVENOUS | Status: DC | PRN
  Administered 2019-05-19: 100 ug via INTRAVENOUS

## 2019-05-19 MED ORDER — MIDAZOLAM HCL 2 MG/2ML IJ SOLN
1.0000 mg | Freq: Once | INTRAMUSCULAR | Status: AC
  Administered 2019-05-19: 15:00:00 1 mg via INTRAVENOUS

## 2019-05-19 MED ORDER — BUPIVACAINE LIPOSOME 1.3 % IJ SUSP
INTRAMUSCULAR | Status: DC | PRN
  Administered 2019-05-19: 10 mL via PERINEURAL

## 2019-05-19 MED ORDER — OXYCODONE HCL 5 MG/5ML PO SOLN: 5.0000 mg | Freq: Once | ORAL | Status: DC | PRN

## 2019-05-19 MED ORDER — LIDOCAINE-EPINEPHRINE 1 %-1:100000 IJ SOLN
INTRAMUSCULAR | Status: AC
  Filled 2019-05-19: qty 2

## 2019-05-19 MED ORDER — SUGAMMADEX SODIUM 200 MG/2ML IV SOLN
INTRAVENOUS | Status: DC | PRN
  Administered 2019-05-19: 100 mg via INTRAVENOUS

## 2019-05-19 MED ORDER — TECHNETIUM TC 99M SULFUR COLLOID FILTERED
1.0000 | Freq: Once | INTRAVENOUS | Status: AC | PRN
  Administered 2019-05-19: 1 via INTRADERMAL

## 2019-05-19 MED ORDER — DEXAMETHASONE SODIUM PHOSPHATE 10 MG/ML IJ SOLN
INTRAMUSCULAR | Status: DC | PRN
  Administered 2019-05-19: 5 mg via INTRAVENOUS

## 2019-05-19 MED ORDER — OXYCODONE HCL 5 MG PO TABS: 5.0000 mg | ORAL_TABLET | Freq: Once | ORAL | Status: DC | PRN

## 2019-05-19 MED ORDER — PROPOFOL 10 MG/ML IV BOLUS
INTRAVENOUS | Status: DC | PRN
  Administered 2019-05-19: 100 mg via INTRAVENOUS

## 2019-05-19 MED ORDER — CHLORHEXIDINE GLUCONATE CLOTH 2 % EX PADS: 6.0000 | MEDICATED_PAD | Freq: Once | CUTANEOUS | Status: DC

## 2019-05-19 MED ORDER — ACETAMINOPHEN 500 MG PO TABS
1000.0000 mg | ORAL_TABLET | ORAL | Status: AC
  Administered 2019-05-19: 13:00:00 1000 mg via ORAL

## 2019-05-19 MED ORDER — SUCCINYLCHOLINE CHLORIDE 20 MG/ML IJ SOLN
INTRAMUSCULAR | Status: DC | PRN
  Administered 2019-05-19: 80 mg via INTRAVENOUS

## 2019-05-19 MED ORDER — FENTANYL CITRATE (PF) 100 MCG/2ML IJ SOLN
50.0000 ug | Freq: Once | INTRAMUSCULAR | Status: AC
  Administered 2019-05-19: 15:00:00 50 ug via INTRAVENOUS

## 2019-05-19 MED ORDER — MEPERIDINE HCL 25 MG/ML IJ SOLN: 6.2500 mg | INTRAMUSCULAR | Status: DC | PRN

## 2019-05-19 MED ORDER — CELECOXIB 100 MG PO CAPS
100.0000 mg | ORAL_CAPSULE | ORAL | Status: AC
  Administered 2019-05-19: 100 mg via ORAL
  Filled 2019-05-19: qty 1

## 2019-05-19 MED ORDER — FENTANYL CITRATE (PF) 100 MCG/2ML IJ SOLN: 25.0000 ug | INTRAMUSCULAR | Status: DC | PRN

## 2019-05-19 MED ORDER — ACETAMINOPHEN 325 MG PO TABS: 325.0000 mg | ORAL_TABLET | ORAL | Status: DC | PRN

## 2019-05-19 MED ORDER — CEFAZOLIN SODIUM-DEXTROSE 2-4 GM/100ML-% IV SOLN
INTRAVENOUS | Status: AC
  Filled 2019-05-19: qty 100

## 2019-05-19 MED ORDER — 0.9 % SODIUM CHLORIDE (POUR BTL) OPTIME
TOPICAL | Status: DC | PRN
  Administered 2019-05-19: 1000 mL

## 2019-05-19 MED ORDER — FENTANYL CITRATE (PF) 100 MCG/2ML IJ SOLN
INTRAMUSCULAR | Status: AC
  Administered 2019-05-19: 50 ug via INTRAVENOUS
  Filled 2019-05-19: qty 2

## 2019-05-19 MED ORDER — ROPIVACAINE HCL 5 MG/ML IJ SOLN
INTRAMUSCULAR | Status: DC | PRN
  Administered 2019-05-19: 20 mL via PERINEURAL

## 2019-05-19 MED ORDER — ACETAMINOPHEN 160 MG/5ML PO SOLN: 325.0000 mg | ORAL | Status: DC | PRN

## 2019-05-19 MED ORDER — LIDOCAINE-EPINEPHRINE 1 %-1:100000 IJ SOLN
INTRAMUSCULAR | Status: DC | PRN
  Administered 2019-05-19: 30 mL

## 2019-05-19 MED ORDER — ONDANSETRON HCL 4 MG/2ML IJ SOLN: 4.0000 mg | Freq: Once | INTRAMUSCULAR | Status: DC | PRN

## 2019-05-19 MED ORDER — MIDAZOLAM HCL 2 MG/2ML IJ SOLN
INTRAMUSCULAR | Status: AC
  Filled 2019-05-19: qty 2

## 2019-05-19 MED ORDER — CEFAZOLIN SODIUM-DEXTROSE 2-4 GM/100ML-% IV SOLN
2.0000 g | INTRAVENOUS | Status: AC
  Administered 2019-05-19: 2 g via INTRAVENOUS

## 2019-05-19 MED ORDER — MIDAZOLAM HCL 2 MG/2ML IJ SOLN
INTRAMUSCULAR | Status: AC
  Administered 2019-05-19: 1 mg via INTRAVENOUS
  Filled 2019-05-19: qty 2

## 2019-05-19 MED ORDER — PROPOFOL 10 MG/ML IV BOLUS
INTRAVENOUS | Status: AC
  Filled 2019-05-19: qty 20

## 2019-05-19 MED ORDER — ROCURONIUM BROMIDE 100 MG/10ML IV SOLN
INTRAVENOUS | Status: DC | PRN
  Administered 2019-05-19: 10 mg via INTRAVENOUS

## 2019-05-19 MED ORDER — FENTANYL CITRATE (PF) 100 MCG/2ML IJ SOLN
INTRAMUSCULAR | Status: DC | PRN
  Administered 2019-05-19: 75 ug via INTRAVENOUS
  Administered 2019-05-19: 25 ug via INTRAVENOUS

## 2019-05-19 MED ORDER — ACETAMINOPHEN 500 MG PO TABS
ORAL_TABLET | ORAL | Status: AC
  Administered 2019-05-19: 1000 mg via ORAL
  Filled 2019-05-19: qty 2

## 2019-05-19 MED ORDER — LACTATED RINGERS IV SOLN
INTRAVENOUS | Status: DC
  Administered 2019-05-19: 13:00:00 via INTRAVENOUS

## 2019-05-19 MED ORDER — LIDOCAINE 2% (20 MG/ML) 5 ML SYRINGE
INTRAMUSCULAR | Status: DC | PRN
  Administered 2019-05-19: 30 mg via INTRAVENOUS

## 2019-05-19 MED ORDER — OXYCODONE HCL 5 MG PO TABS: 5.0000 mg | ORAL_TABLET | Freq: Four times a day (QID) | ORAL | 0 refills | Status: DC | PRN

## 2019-05-19 SURGICAL SUPPLY — 53 items
ADH SKN CLS APL DERMABOND .7 (GAUZE/BANDAGES/DRESSINGS) ×1
APL PRP STRL LF DISP 70% ISPRP (MISCELLANEOUS) ×1
BINDER BREAST LRG (GAUZE/BANDAGES/DRESSINGS) IMPLANT
BINDER BREAST MEDIUM (GAUZE/BANDAGES/DRESSINGS) ×1 IMPLANT
BINDER BREAST XLRG (GAUZE/BANDAGES/DRESSINGS) IMPLANT
BNDG COHESIVE 4X5 TAN STRL (GAUZE/BANDAGES/DRESSINGS) ×2 IMPLANT
CANISTER SUCT 3000ML PPV (MISCELLANEOUS) ×2 IMPLANT
CHLORAPREP W/TINT 26 (MISCELLANEOUS) ×2 IMPLANT
CLIP VESOCCLUDE LG 6/CT (CLIP) ×2 IMPLANT
CLIP VESOCCLUDE MED 6/CT (CLIP) ×3 IMPLANT
CLIP VESOCCLUDE SM WIDE 6/CT (CLIP) ×2 IMPLANT
CLSR STERI-STRIP ANTIMIC 1/2X4 (GAUZE/BANDAGES/DRESSINGS) ×1 IMPLANT
CONT SPEC 4OZ CLIKSEAL STRL BL (MISCELLANEOUS) ×4 IMPLANT
COVER PROBE W GEL 5X96 (DRAPES) ×2 IMPLANT
COVER SURGICAL LIGHT HANDLE (MISCELLANEOUS) ×2 IMPLANT
COVER WAND RF STERILE (DRAPES) ×1 IMPLANT
DERMABOND ADVANCED (GAUZE/BANDAGES/DRESSINGS) ×1
DERMABOND ADVANCED .7 DNX12 (GAUZE/BANDAGES/DRESSINGS) ×1 IMPLANT
DEVICE DUBIN SPECIMEN MAMMOGRA (MISCELLANEOUS) IMPLANT
DRAPE CHEST BREAST 15X10 FENES (DRAPES) ×2 IMPLANT
DRSG PAD ABDOMINAL 8X10 ST (GAUZE/BANDAGES/DRESSINGS) ×1 IMPLANT
ELECT COATED BLADE 2.86 ST (ELECTRODE) ×2 IMPLANT
ELECT NDL BLADE 2-5/6 (NEEDLE) ×1 IMPLANT
ELECT NEEDLE BLADE 2-5/6 (NEEDLE) ×2 IMPLANT
ELECT REM PT RETURN 9FT ADLT (ELECTROSURGICAL) ×2
ELECTRODE REM PT RTRN 9FT ADLT (ELECTROSURGICAL) ×1 IMPLANT
GAUZE SPONGE 4X4 12PLY STRL (GAUZE/BANDAGES/DRESSINGS) ×1 IMPLANT
GLOVE BIO SURGEON STRL SZ 6 (GLOVE) ×2 IMPLANT
GLOVE INDICATOR 6.5 STRL GRN (GLOVE) ×2 IMPLANT
GOWN STRL REUS W/ TWL LRG LVL3 (GOWN DISPOSABLE) ×1 IMPLANT
GOWN STRL REUS W/TWL 2XL LVL3 (GOWN DISPOSABLE) ×2 IMPLANT
GOWN STRL REUS W/TWL LRG LVL3 (GOWN DISPOSABLE) ×2
KIT BASIN OR (CUSTOM PROCEDURE TRAY) ×2 IMPLANT
KIT MARKER MARGIN INK (KITS) ×2 IMPLANT
LIGHT WAVEGUIDE WIDE FLAT (MISCELLANEOUS) ×1 IMPLANT
NDL 18GX1X1/2 (RX/OR ONLY) (NEEDLE) IMPLANT
NDL FILTER BLUNT 18X1 1/2 (NEEDLE) IMPLANT
NDL HYPO 25GX1X1/2 BEV (NEEDLE) ×1 IMPLANT
NEEDLE 18GX1X1/2 (RX/OR ONLY) (NEEDLE) IMPLANT
NEEDLE FILTER BLUNT 18X 1/2SAF (NEEDLE)
NEEDLE FILTER BLUNT 18X1 1/2 (NEEDLE) IMPLANT
NEEDLE HYPO 25GX1X1/2 BEV (NEEDLE) ×2 IMPLANT
NS IRRIG 1000ML POUR BTL (IV SOLUTION) ×2 IMPLANT
PACK GENERAL/GYN (CUSTOM PROCEDURE TRAY) ×2 IMPLANT
PACK UNIVERSAL I (CUSTOM PROCEDURE TRAY) ×2 IMPLANT
STOCKINETTE IMPERVIOUS 9X36 MD (GAUZE/BANDAGES/DRESSINGS) ×2 IMPLANT
SUT MNCRL AB 4-0 PS2 18 (SUTURE) ×2 IMPLANT
SUT VIC AB 2-0 SH 27 (SUTURE) ×2
SUT VIC AB 2-0 SH 27XBRD (SUTURE) IMPLANT
SUT VIC AB 3-0 SH 8-18 (SUTURE) ×2 IMPLANT
SYR CONTROL 10ML LL (SYRINGE) ×2 IMPLANT
TOWEL GREEN STERILE (TOWEL DISPOSABLE) ×2 IMPLANT
TOWEL GREEN STERILE FF (TOWEL DISPOSABLE) ×2 IMPLANT

## 2019-05-19 NOTE — Op Note (Signed)
Right Breast Radioactive seed localized lumpectomy x 2 and sentinel lymph node mapping and biopsy  Indications: This patient presents with history of right breast cancer.  She had a dominant 4 cm mass at 11 o'clock and a smaller one peripherally near the same location.  She is s/p neoadjuvant chemotherapy.    Pre-operative Diagnosis: right breast cancer, cT2N0 grade 3 invasive ductal carcinoma, -/-/+.    Post-operative Diagnosis: same  Surgeon: [name] MD  Assistant:  Arrie Aran, RNFA  Anesthesia: General endotracheal anesthesia  ASA Class: 2  Procedure Details  The patient was seen in the Holding Room. The risks, benefits, complications, treatment options, and expected outcomes were discussed with the patient. The possibilities of bleeding, infection, the need for additional procedures, failure to diagnose a condition, and creating a complication requiring transfusion or operation were discussed with the patient. The patient concurred with the proposed plan, giving informed consent.  The site of surgery properly noted/marked. The patient was taken to Operating Room # 2, identified, and the procedure verified as Right Breast seed localized Lumpectomy x 2 and sentinel lymph node mapping and biopsy. A Time Out was held and the above information confirmed. The methylene blue was injected into the subareolar breast tissue.    The right arm, breast, and chest were prepped and draped in standard fashion. The lumpectomy was performed by creating an axillary incision near the previously placed radioactive seeds.  Dissection was carried down around the point of maximum signal intensity of the small satellite lesion laterally and superficially. The cautery was used to perform the dissection.  Hemostasis was achieved with cautery. The second seed was accessed via the same incision, but the cavities were NOT contiguous.  The edges of the larger cavity were marked with large clips, with one each medial,  lateral, inferior and superior, and two clips posteriorly.   The specimen was inked with the margin marker paint kit.    Specimen radiography confirmed inclusion of the mammographic lesion, the clip, and the seed.  The background signal in the breast was zero.  The wound was irrigated and closed with 3-0 vicryl in layers and 4-0 monocryl subcuticular suture.    Using a hand-held gamma probe, right axillary sentinel nodes were identified transcutaneously.  The same axillary incision was used.   Dissection was carried through the clavipectoral fascia.  Four deep level 2 axillary sentinel nodes were removed.  Counts per second were 590 at the highest, then 79, 18, and 60.    The background count was 0 cps.  The wound was irrigated.  Hemostasis was achieved with cautery.  The axillary incision was closed with a 3-0 vicryl deep dermal interrupted sutures and a 4-0 monocryl subcuticular closure.    Sterile dressings were applied. At the end of the operation, all sponge, instrument, and needle counts were correct.  Findings: grossly clear surgical margins and no adenopathy.  Anterior margin is skin and posterior margin is pectoralis  Estimated Blood Loss:  min         Specimens: right breast lumpectomy x 2 and four axillary sentinel lymph nodes.             Complications:  None; patient tolerated the procedure well.         Disposition: PACU - hemodynamically stable.         Condition: stable

## 2019-05-19 NOTE — Interval H&P Note (Signed)
History and Physical Interval Note:  05/19/2019 12:05 PM  Christina Sexton  has presented today for surgery, with the diagnosis of RIGHT BREAST CANCER.  The various methods of treatment have been discussed with the patient and family. After consideration of risks, benefits and other options for treatment, the patient has consented to  Procedure(s): BREAST LUMPECTOMY WITH RADIOACTIVE SEED X2  AND SENTINEL LYMPH NODE BIOPSY (Right) as a surgical intervention.  The patient's history has been reviewed, patient examined, no change in status, stable for surgery.  I have reviewed the patient's chart and labs.  Questions were answered to the patient's satisfaction.     [name]

## 2019-05-19 NOTE — Transfer of Care (Signed)
Immediate Anesthesia Transfer of Care Note  Patient: Christina Sexton  Procedure(s) Performed: BREAST LUMPECTOMY WITH RADIOACTIVE SEED X2  AND SENTINEL LYMPH NODE BIOPSY (Right Breast)  Patient Location: PACU  Anesthesia Type:General  Level of Consciousness: awake and alert   Airway & Oxygen Therapy: Patient Spontanous Breathing  Post-op Assessment: Report given to RN and Post -op Vital signs reviewed and stable  Post vital signs: Reviewed and stable  Last Vitals:  Vitals Value Taken Time  BP 142/69 05/19/19 1717  Temp 36.6 C 05/19/19 1717  Pulse 89 05/19/19 1724  Resp 16 05/19/19 1724  SpO2 96 % 05/19/19 1724  Vitals shown include unvalidated device data.  Last Pain:  Vitals:   05/19/19 1717  TempSrc:   PainSc: Asleep      Patients Stated Pain Goal: 3 ([id number])  Complications: No apparent anesthesia complications

## 2019-05-19 NOTE — Anesthesia Procedure Notes (Signed)
Anesthesia Regional Block: Pectoralis block   Pre-Anesthetic Checklist: ,, timeout performed, Correct Patient, Correct Site, Correct Laterality, Correct Procedure, Correct Position, site marked, Risks and benefits discussed,  Surgical consent,  Pre-op evaluation,  At surgeon's request and post-op pain management  Laterality: Right  Prep: Maximum Sterile Barrier Precautions used, chloraprep       Needles:  Injection technique: Single-shot  Needle Type: Echogenic Stimulator Needle     Needle Length: 9cm  Needle Gauge: 22     Additional Needles:   Procedures:,,,, ultrasound used (permanent image in chart),,,,  Narrative:  Start time: 05/19/2019 2:50 PM End time: 05/19/2019 2:54 PM Injection made incrementally with aspirations every 5 mL.  Performed by: Personally  Anesthesiologist: Pervis Hocking, DO  Additional Notes: Monitors applied. No increased pain on injection. No increased resistance to injection. Injection made in 5cc increments. Good needle visualization. Patient tolerated procedure well.

## 2019-05-19 NOTE — Discharge Instructions (Signed)
[facility] °Office Phone Number [phone] ° °BREAST BIOPSY/ PARTIAL MASTECTOMY: POST OP INSTRUCTIONS ° °Always review your discharge instruction sheet given to you by the facility where your surgery was performed. ° °IF YOU HAVE DISABILITY OR FAMILY LEAVE FORMS, YOU MUST BRING THEM TO THE OFFICE FOR PROCESSING.  DO NOT GIVE THEM TO YOUR DOCTOR. ° °1. A prescription for pain medication may be given to you upon discharge.  Take your pain medication as prescribed, if needed.  If narcotic pain medicine is not needed, then you may take acetaminophen (Tylenol) or ibuprofen (Advil) as needed. °2. Take your usually prescribed medications unless otherwise directed °3. If you need a refill on your pain medication, please contact your pharmacy.  They will contact our office to request authorization.  Prescriptions will not be filled after 5pm or on week-ends. °4. You should eat very light the first 24 hours after surgery, such as soup, crackers, pudding, etc.  Resume your normal diet the day after surgery. °5. Most patients will experience some swelling and bruising in the breast.  Ice packs and a good support bra will help.  Swelling and bruising can take several days to resolve.  °6. It is common to experience some constipation if taking pain medication after surgery.  Increasing fluid intake and taking a stool softener will usually help or prevent this problem from occurring.  A mild laxative (Milk of Magnesia or Miralax) should be taken according to package directions if there are no bowel movements after 48 hours. °7. Unless discharge instructions indicate otherwise, you may remove your bandages 48 hours after surgery, and you may shower at that time.  You may have steri-strips (small skin tapes) in place directly over the incision.  These strips should be left on the skin for 7-10 days.   Any sutures or staples will be removed at the office during your follow-up visit. °8. ACTIVITIES:  You may resume  regular daily activities (gradually increasing) beginning the next day.  Wearing a good support bra or sports bra (or the breast binder) minimizes pain and swelling.  You may have sexual intercourse when it is comfortable. °a. You may drive when you no longer are taking prescription pain medication, you can comfortably wear a seatbelt, and you can safely maneuver your car and apply brakes. °b. RETURN TO WORK:  __________1 week_______________ °9. You should see your doctor in the office for a follow-up appointment approximately two weeks after your surgery.  Your doctor’s nurse will typically make your follow-up appointment when she calls you with your pathology report.  Expect your pathology report 2-3 business days after your surgery.  You may call to check if you do not hear from us after three days. ° ° °WHEN TO CALL YOUR DOCTOR: °1. Fever over 101.0 °2. Nausea and/or vomiting. °3. Extreme swelling or bruising. °4. Continued bleeding from incision. °5. Increased pain, redness, or drainage from the incision. ° °The clinic staff is available to answer your questions during regular business hours.  Please don’t hesitate to call and ask to speak to one of the nurses for clinical concerns.  If you have a medical emergency, go to the nearest emergency room or call 911.  A surgeon from [facility] is always on call at the hospital. ° °For further questions, please visit [email or web address]  ° °

## 2019-05-20 ENCOUNTER — Encounter (HOSPITAL_COMMUNITY): Payer: Self-pay | Admitting: General Surgery

## 2019-05-20 NOTE — Anesthesia Postprocedure Evaluation (Signed)
Anesthesia Post Note  Patient: Christina Sexton  Procedure(s) Performed: BREAST LUMPECTOMY WITH RADIOACTIVE SEED X2  AND SENTINEL LYMPH NODE BIOPSY (Right Breast)     Patient location during evaluation: PACU Anesthesia Type: Regional and General Level of consciousness: awake and alert Pain management: pain level controlled Vital Signs Assessment: post-procedure vital signs reviewed and stable Respiratory status: spontaneous breathing, nonlabored ventilation, respiratory function stable and patient connected to nasal cannula oxygen Cardiovascular status: blood pressure returned to baseline and stable Postop Assessment: no apparent nausea or vomiting Anesthetic complications: no    Last Vitals:  Vitals:   05/19/19 1717 05/19/19 1731  BP: (!) 142/69 (!) 153/78  Pulse: (!) 101 92  Resp: 16 16  Temp: 36.6 C   SpO2: 99% 97%    Last Pain:  Vitals:   05/19/19 1717  TempSrc:   PainSc: Asleep   Pain Goal: Patients Stated Pain Goal: 3 (05/19/19 1229)                 Bassett

## 2019-05-24 LAB — SURGICAL PATHOLOGY

## 2019-05-24 NOTE — Progress Notes (Signed)
Christina Sexton   [phone] [phone]   Clinic Follow up Note   Patient Care Team: Deland Pretty, MD as PCP - General (Internal Medicine) [name], RN as Oncology Nurse Navigator [name], RN as Oncology Nurse Navigator [name], MD as Consulting Physician (General Surgery) [name], MD as Consulting Physician (Hematology) [name], MD as Attending Physician (Radiation Oncology)  Date of Service:  06/02/2019  CHIEF COMPLAINT: F/u of right breast cancer  SUMMARY OF ONCOLOGIC HISTORY: Oncology History Overview Note  Cancer Staging Malignant neoplasm of upper-outer quadrant of right female breast [facility]) Staging form: Breast, AJCC 8th Edition - Clinical stage from 12/09/2018: Stage IIA (cT2, cN0, cM0, G3, ER-, PR-, HER2+) - Signed by [name], MD on 12/15/2018 - Pathologic stage from 05/19/2019: No Stage Recommended (ypT0, pN0, cM0, GX, ER: Not Assessed, PR: Not Assessed, HER2: Not Assessed) - Signed by [name], MD on 06/01/2019    Malignant neoplasm of upper-outer quadrant of right female breast ([place])  12/07/2018 Mammogram   Mammogram 12/07/18  IMPRESSION: 1. 3 cm mass in the 11 o'clock position of the right breast, 4 cm the nipple, measuring 3 x 2 x 2 cm, highly suspicious for breast carcinoma. No enlarged or abnormal right axillary lymph nodes.   12/09/2018 Cancer Staging   Staging form: Breast, AJCC 8th Edition - Clinical stage from 12/09/2018: Stage IIA (cT2, cN0, cM0, G3, ER-, PR-, HER2+) - Signed by [name], MD on 12/15/2018   12/09/2018 Initial Biopsy   Diagnosis 12/09/18 Breast, right, needle core biopsy, 11 o'clock - INVASIVE DUCTAL CARCINOMA. - DUCTAL CARCINOMA IN SITU. - SEE COMMENT.   12/09/2018 Receptors her2   The tumor cells are POSITIVE for Her2 (3+). Estrogen Receptor: 0%, NEGATIVE Progesterone Receptor: 0%, NEGATIVE Proliferation Marker Ki67: 30%   12/15/2018 Initial Diagnosis   Malignant neoplasm of  upper-outer quadrant of right female breast ([place])   12/21/2018 Breast MRI   IMPRESSION: 1. Approximate 4 cm mass involving the UPPER RIGHT breast with extension into the UPPER INNER QUADRANT and UPPER OUTER QUADRANT, predominantly located in the [place], biopsy-proven invasive ductal carcinoma and DCIS. The mass abuts the chest wall though I cannot confirm pectoralis muscle invasion. 2. Superficial satellite mass versus pathologic intramammary lymph node in the UPPER OUTER QUADRANT of the RIGHT breast measuring approximately 1 cm, located LATERAL to the dominant mass. 3. No MRI evidence of malignancy involving the LEFT breast. 4. No pathologic lymphadenopathy otherwise.   12/24/2018 Pathology Results   Diagnosis Breast, right, needle core biopsy, upper outer quadrant, peripheral to the recently biopsy proven breast carcinoma - INVASIVE DUCTAL CARCINOMA. SEE COMMENT. Results: IMMUNOHISTOCHEMICAL AND MORPHOMETRIC ANALYSIS PERFORMED MANUALLY The tumor cells are POSITIVE for Her2 (3+). Estrogen Receptor: 0%, NEGATIVE Progesterone Receptor: 0%, NEGATIVE Proliferation Marker Ki67: 40%   01/06/2019 -  Chemotherapy   Neoadjuvant chemotherapy TCHP beginning 01/06/19 completed 5 cycle of TCHP on 03/30/19.  She started maintenance Herceptin on 05/04/19.    04/15/2019 Breast MRI   IMPRESSION: 1. Interval resolution of the 2 enhancing masses corresponding with biopsy proven malignancy in the upper right breast. There is no residual or new, suspicious enhancement. 2. No MRI evidence of malignancy in the left breast.   04/15/2019 Echocardiogram   FINDINGS  Left Ventricle: Left ventricular ejection fraction, by visual estimation, is 55 to 60%. The left ventricle has normal function. There is no left ventricular hypertrophy. Normal left ventricular size. Spectral Doppler shows Left ventricular diastolic  parameters were normal pattern of LV diastolic filling. Compared with the echo 12/31/48,  systolic function is slightly worse, especially in the anteroseptum. However overall strain is slightly better.     05/19/2019 Surgery   BREAST LUMPECTOMY WITH RADIOACTIVE SEED X2  AND SENTINEL LYMPH NODE BIOPSY by Dr. Barry Dienes  05/19/19    05/19/2019 Pathology Results   FINAL MICROSCOPIC DIAGNOSIS:   A. BREAST, RIGHT LATERAL SATELLITE LESION, LUMPECTOMY:  - No residual invasive carcinoma status post neoadjuvant treatment.  - One intraparenchymal lymph node, negative for carcinoma (0/1).  - See oncology table.   B. BREAST, RIGHT DOMINANT MASS, LUMPECTOMY:  - No residual invasive carcinoma status post neoadjuvant treatment.  - Biopsy site changes.  - See oncology table.   C. SENTINEL LYMPH NODE, RIGHT AXILLARY, EXCISION:  - One lymph node, negative for carcinoma (0/1).   D. SENTINEL LYMPH NODE, RIGHT AXILLARY, EXCISION:  - One lymph node, negative for carcinoma (0/1).   E. SENTINEL LYMPH NODE, RIGHT AXILLARY, EXCISION:  - One lymph node, negative for carcinoma (0/1).   F. SENTINEL LYMPH NODE, RIGHT AXILLARY, EXCISION:  - One lymph node, negative for carcinoma (0/1).    05/19/2019 Cancer Staging   Staging form: Breast, AJCC 8th Edition - Pathologic stage from 05/19/2019: No Stage Recommended (ypT0, pN0, cM0, GX, ER: Not Assessed, PR: Not Assessed, HER2: Not Assessed) - Signed by [name], MD on 06/01/2019      CURRENT THERAPY:  She started maintenance Herceptin on 05/04/19. Plan to continue to complete 1 year of treatment from 12/2018.   INTERVAL HISTORY:  Christina Sexton is here for a follow up and treatment. She presents to the clinic alone. She notes her breast surgery went well. She has residual right breast tenderness and soreness and able to have normal ROM. She has not followed up with Dr Barry Dienes. She notes she recently saw cardiologist who cleared her to continue Herceptin.  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 She notes she has dropped some things  occasionally and has trouble buttoning.    REVIEW OF SYSTEMS:   Constitutional: Denies fevers, chills or abnormal weight loss Eyes: Denies blurriness of vision Ears, nose, mouth, throat, and face: Denies mucositis or sore throat Respiratory: Denies cough, dyspnea or wheezes Cardiovascular: Denies palpitation, chest discomfort or lower extremity swelling Gastrointestinal:  Denies nausea, heartburn or change in bowel habits Skin: Denies abnormal skin rashes Lymphatics: Denies new lymphadenopathy or easy bruising Neurological: (+) Mild residual neuropathy in fingers and toes.  Behavioral/Psych: Mood is stable, no new changes  All other systems were reviewed with the patient and are negative.  MEDICAL HISTORY:  Past Medical History:  Diagnosis Date  . Allergy   . Anemia   . Asthma   . Cancer ([place])    Breast-R  . Chronic kidney disease   . History of kidney stones   . Hyperlipidemia   . Hypertension   . Kidney stone   . Mitral valve prolapse   . Osteoporosis     SURGICAL HISTORY: Past Surgical History:  Procedure Laterality Date  . BREAST BIOPSY Left 12/27/2015   benign  . BREAST BIOPSY Right 07/26/2013   benign  . BREAST EXCISIONAL BIOPSY Right 09/08/2013   high risk   . BREAST LUMPECTOMY WITH RADIOACTIVE SEED AND SENTINEL LYMPH NODE BIOPSY Right 05/19/2019   Procedure: BREAST LUMPECTOMY WITH RADIOACTIVE SEED X2  AND SENTINEL LYMPH NODE BIOPSY;  Surgeon: [name], MD;  Location: [facility];  Service: General;  Laterality: Right;  . PORTACATH PLACEMENT N/A 12/28/2018   Procedure: INSERTION PORT-A-CATH;  Surgeon: [name], MD;  Location: [facility];  Service: General;  Laterality: N/A;  . SHOULDER SURGERY      I have reviewed the social history and family history with the patient and they are unchanged from previous note.  ALLERGIES:  has No Known Allergies.  MEDICATIONS:  Current Outpatient Medications  Medication Sig Dispense Refill  . atorvastatin (LIPITOR) 20 MG  tablet Take 20 mg by mouth daily.    Marland Kitchen lidocaine-prilocaine (EMLA) cream Apply to affected area once 30 g 3  . pantoprazole (PROTONIX) 20 MG tablet TAKE 1 TABLET BY MOUTH EVERY DAY 30 tablet 1  . potassium chloride SA (KLOR-CON) 20 MEQ tablet Take 1 tablet (20 mEq total) by mouth every other day. 30 tablet 1   Current Facility-Administered Medications  Medication Dose Route Frequency Provider Last Rate Last Dose  . 0.9 %  sodium chloride infusion  500 mL Intravenous Once Nandigam, Venia Minks, MD        PHYSICAL EXAMINATION: ECOG PERFORMANCE STATUS: 1 - Symptomatic but completely ambulatory  Vitals:   06/02/19 0822  BP: 127/87  Pulse: 93  Resp: 18  Temp: (!) 97.4 F (36.3 C)  SpO2: 99%   Filed Weights   06/02/19 0822  Weight: 100 lb 14.4 oz (45.8 kg)    GENERAL:alert, no distress and comfortable SKIN: skin color, texture, turgor are normal, no rashes or significant lesions EYES: normal, Conjunctiva are pink and non-injected, sclera clear  NECK: supple, thyroid normal size, non-tender, without nodularity LYMPH:  no palpable lymphadenopathy in the cervical, axillary  LUNGS: clear to auscultation and percussion with normal breathing effort HEART: regular rate & rhythm and no murmurs and no lower extremity edema ABDOMEN:abdomen soft, non-tender and normal bowel sounds Musculoskeletal:no cyanosis of digits and no clubbing  NEURO: alert & oriented x 3 with fluent speech, no focal motor/sensory deficits BREAST: s/p right lumpectomy: Surgical incision is healing well. No palpable mass, nodules or adenopathy bilaterally. Breast exam benign.   LABORATORY DATA:  I have reviewed the data as listed CBC Latest Ref Rng & Units 05/14/2019 05/04/2019 03/30/2019  WBC 4.0 - 10.5 K/uL 3.0(L) 4.2 19.7(H)  Hemoglobin 12.0 - 15.0 g/dL 12.3 10.8(L) 11.0(L)  Hematocrit 36.0 - 46.0 % 39.1 33.3(L) 33.7(L)  Platelets 150 - 400 K/uL 170 162 241     CMP Latest Ref Rng & Units 05/14/2019 05/04/2019  03/30/2019  Glucose 70 - 99 mg/dL 107(H) 93 226(H)  BUN 8 - 23 mg/dL '11 11 15  '$ Creatinine 0.44 - 1.00 mg/dL 0.69 0.70 0.73  Sodium 135 - 145 mmol/L 143 143 140  Potassium 3.5 - 5.1 mmol/L 3.9 3.6 3.1(L)  Chloride 98 - 111 mmol/L 107 106 103  CO2 22 - 32 mmol/L '27 27 25  '$ Calcium 8.9 - 10.3 mg/dL 9.7 9.1 9.6  Total Protein 6.5 - 8.1 g/dL - 6.4(L) 6.5  Total Bilirubin 0.3 - 1.2 mg/dL - 0.5 0.2(L)  Alkaline Phos 38 - 126 U/L - 55 87  AST 15 - 41 U/L - 27 21  ALT 0 - 44 U/L - 32 29      RADIOGRAPHIC STUDIES: I have personally reviewed the radiological images as listed and agreed with the findings in the report. No results found.   ASSESSMENT & PLAN:  DAVI ROTAN is a 68 y.o. 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 -Breast MRI shows multifocal disease of right breast. Her 6/25/20secondary biopsy shows satellite lesion in right UOQ that is positive for invasive ductal carcinoma; with similar molecular features. -She had baseline ECHOwith60-65% and PACwasplaced. -She completed 5/6 cycles of neoadjuvant TCHP, she did not tolerate well.  -She underwent right lumpectomy by Dr. Barry Dienes on 05/19/19. We discussed her pathology report which shows complete response from neoadjuvant chemo, negative 5 nodes. This predicts very low risk of recurrence.  -To further reduce her risk of local recurrence adjuvant radiation with Dr Isidore Moos is recommended. She is apprehensive about proceeding but is open to further discuss this with Rad Onc.  -She started maintenance Herceptin every 3 weeks on 05/04/19. I strongly recommend her to continue for a total of 1 year of treatment from 12/2018. She was reluctant for more treatment, we discussed the benefits, and the relatively low risk of the treatment, after lengthy discussion, she voiced to continue  Herceptin point echocardiogram every 3 months, giving her recent slightly decreased EF.  I discussed the option of Herceptin injection. She opted to continue with infusion.  -Given her negative lymph nodes at the initial diagnosis, benefits of adjuvant Perjeta is minimal, will discontinue Perjeta -Physical exam unremarkable. Labs reviewed this month and adequate to proceed with Herceptin today. -Her 04/2019 ECHO shows EF slightly dropped to 55-60%. She was seen by cardiology yesterday.  Continue to monitor every 3 months.  -F/u in 9 weeks    2. 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 I discussed this can take a while to recover. I suggest she use OTC B complex. If not enough I can call in Gabapentin.  -She has been able to maintain her weight now and her hair and nail growth is slowly returning.  -I encouraged her to continue eating adequately and exercising for strength.   3. Social and financial support  -She is single, lives alone and has no children. She lives 1-2 miles from cancer center.  -She notes she has friends but overall not much family support. -She is so far able to do all self-care -She is concerned about mounting medical bills and is interested in talking with financial advocate today. She has medicare. I referred her. Due to privacy issues, she prefers to be called at home.   4. Hyperglycemia  -Secondary to steroids during neoadjuvant chemo. Should resolve after completion of chemo.  -9/102/02 A1c at 5.6, continue to monitor.    PLAN: -Labs reviewed and adequate to proceed with Herceptin today, will reload since her last treatment was 29 days ago -Send Rad Onc Referral to Dr Isidore Moos  -Lab, flush, Herceptin in 3, 6, 9 weeks  -F/u in 9 weeks   No problem-specific Assessment & Plan notes found for this encounter.   No orders of the defined types were placed in this encounter.   All questions were answered. The patient knows to call the clinic with any problems, questions or concerns. No barriers to learning was detected. I spent 20 minutes counseling the patient face to face. The total time spent in the appointment was 25 minutes and more than 50% was on counseling and review of test results     [name], MD 06/02/2019   I, Joslyn Devon, am acting as scribe for [name], MD.   I have reviewed the above documentation for accuracy and completeness, and I agree  with the above.

## 2019-05-24 NOTE — Progress Notes (Signed)
Please let patient know that there was no residual cancer and lymph nodes were also negative.

## 2019-05-28 ENCOUNTER — Encounter: Payer: Self-pay | Admitting: *Deleted

## 2019-05-28 NOTE — Progress Notes (Signed)
[facility] Clinical Social Work  Clinical Social Work received voicemail patient requesting another [employer].  CSW spoke with patient and agreed to leave at front desk for pick up.  Patient will contact CSW team to confirm pick up next week.  Gwinda Maine, LCSW  Clinical Social Worker [facility]

## 2019-05-31 NOTE — Progress Notes (Signed)
Cardiology Office Note   Date:  06/04/2019   ID:  Christina Sexton, DOB [birth year], MRN [id number]  PCP:  Deland Pretty, MD  Cardiologist:   No primary care provider on file. Referring:  Alla Feeling, NP   Chief Complaint  Patient presents with  . Abnormal Echo      History of Present Illness: Christina Sexton is a 68 y.o. female who presents for evaluation of an abnormal echo..  She has breast cancer.  This is stage IIa HER-2 positive.  This was in the right breast.  Prior to getting therapy she had an echocardiogram which demonstrated the EF  to be 60 to 65%.  There was otherwise unremarkable.  She has been treated with TCHP (Herceptin, Perjeta, Taxotere and carboplatinum) she had an MRI that demonstrated resolution of the 2 enhancing masses in the right breast.  A repeat echocardiogram suggested the EF to be slightly reduced at 55 to 60%.  It was again otherwise unremarkable.  She otherwise feels well.  Has lost weight.  Had shingles.  It sounds like she had a tough time with some of the chemotherapies.  The suggestion is that she have follow-up prior to having maintenance Herceptin and Perjeta therapy.  She is also to get radiation.  She is considering whether she wants to have this done.  She denies any specific cardiovascular symptoms. The patient denies any new symptoms such as chest discomfort, neck or arm discomfort. There has been no new shortness of breath, PND or orthopnea. There have been no reported palpitations, presyncope or syncope.    Past Medical History:  Diagnosis Date  . Allergy   . Anemia   . Asthma   . Cancer ([place])    Breast-R  . Chronic kidney disease   . History of kidney stones   . Hyperlipidemia   . Hypertension   . Kidney stone   . Mitral valve prolapse   . Osteoporosis     Past Surgical History:  Procedure Laterality Date  . BREAST BIOPSY Left 12/27/2015   benign  . BREAST BIOPSY Right 07/26/2013   benign  . BREAST EXCISIONAL BIOPSY  Right 09/08/2013   high risk   . BREAST LUMPECTOMY WITH RADIOACTIVE SEED AND SENTINEL LYMPH NODE BIOPSY Right 05/19/2019   Procedure: BREAST LUMPECTOMY WITH RADIOACTIVE SEED X2  AND SENTINEL LYMPH NODE BIOPSY;  Surgeon: [name], MD;  Location: [facility];  Service: General;  Laterality: Right;  . PORTACATH PLACEMENT N/A 12/28/2018   Procedure: INSERTION PORT-A-CATH;  Surgeon: [name], MD;  Location[facility];  Service: General;  Laterality: N/A;  . SHOULDER SURGERY       Current Outpatient Medications  Medication Sig Dispense Refill  . atorvastatin (LIPITOR) 20 MG tablet Take 20 mg by mouth daily.    . pantoprazole (PROTONIX) 20 MG tablet TAKE 1 TABLET BY MOUTH EVERY DAY 30 tablet 1  . potassium chloride SA (KLOR-CON) 20 MEQ tablet Take 1 tablet (20 mEq total) by mouth every other day. 30 tablet 1   Current Facility-Administered Medications  Medication Dose Route Frequency Provider Last Rate Last Dose  . 0.9 %  sodium chloride infusion  500 mL Intravenous Once Nandigam, Venia Minks, MD        Allergies:   Patient has no known allergies.    Social History:  The patient  reports that she has never smoked. She has never used smokeless tobacco. She reports current alcohol use. She reports that she does  not use drugs.   Family History:  The patient's family history includes COPD in her mother; Colon cancer in her brother; Diabetes in her father.    ROS:  Please see the history of present illness.   Otherwise, review of systems are positive for none.   All other systems are reviewed and negative.    PHYSICAL EXAM: VS:  BP (!) 148/84   Pulse 90   Ht _0  (1.6 m)   Wt 101 lb 12.8 oz (46.2 kg)   BMI 18.03 kg/m  , BMI Body mass index is 18.03 kg/m. GENERAL:  Thin appearing HEENT:  Pupils equal round and reactive, fundi not visualized, oral mucosa unremarkable NECK:  No jugular venous distention, waveform within normal limits, carotid upstroke brisk and symmetric, no bruits, no  thyromegaly LYMPHATICS:  No cervical, inguinal adenopathy LUNGS:  Clear to auscultation bilaterally BACK:  No CVA tenderness CHEST:  Unremarkable HEART:  PMI not displaced or sustained,S1 and S2 within normal limits, no S3, no S4, no clicks, no rubs, no murmurs ABD:  Flat, positive bowel sounds normal in frequency in pitch, no bruits, no rebound, no guarding, no midline pulsatile mass, no hepatomegaly, no splenomegaly EXT:  2 plus pulses throughout, no edema, no cyanosis no clubbing SKIN:  No rashes no nodules NEURO:  Cranial nerves II through XII grossly intact, motor grossly intact throughout PSYCH:  Cognitively intact, oriented to person place and time    EKG:  EKG is not ordered today. The ekg ordered 12/28/18 demonstrates sinus rhythm, rate 73, axis within normal limits, intervals within normal limits, no acute ST-T wave changes.   Recent Labs: 05/04/2019: ALT 32 05/14/2019: BUN 11; Creatinine, Ser 0.69; Hemoglobin 12.3; Platelets 170; Potassium 3.9; Sodium 143    Lipid Panel No results found for: CHOL, TRIG, HDL, CHOLHDL, VLDL, LDLCALC, LDLDIRECT    Wt Readings from Last 3 Encounters:  06/02/19 100 lb 14.4 oz (45.8 kg)  06/01/19 101 lb 12.8 oz (46.2 kg)  05/19/19 98 lb 1 oz (44.5 kg)      Other studies Reviewed: Additional studies/ records that were reviewed today include: Oncology records.  Side by side comparison of echo images.   Review of the above records demonstrates:  Please see elsewhere in the note.     ASSESSMENT AND PLAN:  HIGH RISK MEDICATION: The patient has no symptoms.  I reviewed side-by-side the 2 echocardiograms.  There is some acquisition difference in image quality difference between the 2 studies.  In particular the endocardium for the October study is less well visualized.  There might be a slight reduction in LV function comparing June 2 October but it is not marked.  Overall the ejection fraction appears to be in low normal side.  I had a very long  discussion with the patient about this.  Looking at this there is no absolutely prohibitive finding that would preclude trastuzumab maintenance therapy although she would need very close follow-up of her EF and I would suggest frequent echo surveillance.  We discussed the fact that there are data to support the reversibility of cardiomyopathies should they develop.  She will need standard heart failure therapy.  The risk of this needs to be weighed against the potential benefit and I will defer to her oncologist to further had this conversation.  She is not sure at this point that she wants to proceed.  I think we would need to see her back in follow-up certainly should she elect to proceed with maintenance  therapy.  COVID EDUCATION:  We talked about the vaccine.   Current medicines are reviewed at length with the patient today.  The patient does not have concerns regarding medicines.  The following changes have been made:  no change  Labs/ tests ordered today include: None No orders of the defined types were placed in this encounter.    Disposition:   FU with based on the decision making above.     Signed, Minus Breeding, MD  06/04/2019 1:10 PM    [facility]

## 2019-06-01 ENCOUNTER — Ambulatory Visit (INDEPENDENT_AMBULATORY_CARE_PROVIDER_SITE_OTHER): Payer: Medicare HMO | Admitting: Cardiology

## 2019-06-01 ENCOUNTER — Other Ambulatory Visit: Payer: Self-pay

## 2019-06-01 ENCOUNTER — Encounter: Payer: Self-pay | Admitting: Cardiology

## 2019-06-01 VITALS — BP 148/84 | HR 90 | Ht 63.0 in | Wt 101.8 lb

## 2019-06-01 DIAGNOSIS — C50411 Malignant neoplasm of upper-outer quadrant of right female breast: Secondary | ICD-10-CM | POA: Diagnosis not present

## 2019-06-01 DIAGNOSIS — Z7189 Other specified counseling: Secondary | ICD-10-CM | POA: Diagnosis not present

## 2019-06-01 DIAGNOSIS — Z171 Estrogen receptor negative status [ER-]: Secondary | ICD-10-CM

## 2019-06-01 DIAGNOSIS — R931 Abnormal findings on diagnostic imaging of heart and coronary circulation: Secondary | ICD-10-CM | POA: Diagnosis not present

## 2019-06-01 NOTE — Patient Instructions (Signed)
Medication Instructions:  Your physician recommends that you continue on your current medications as directed. Please refer to the Current Medication list given to you today.  *If you need a refill on your cardiac medications before your next appointment, please call your pharmacy*  Lab Work: none If you have labs (blood work) drawn today and your tests are completely normal, you will receive your results only by: Marland Kitchen MyChart Message (if you have MyChart) OR . A paper copy in the mail If you have any lab test that is abnormal or we need to change your treatment, we will call you to review the results.  Testing/Procedures: none

## 2019-06-02 ENCOUNTER — Inpatient Hospital Stay: Payer: Medicare HMO

## 2019-06-02 ENCOUNTER — Other Ambulatory Visit: Payer: Self-pay | Admitting: *Deleted

## 2019-06-02 ENCOUNTER — Other Ambulatory Visit: Payer: Self-pay

## 2019-06-02 ENCOUNTER — Encounter: Payer: Self-pay | Admitting: Hematology

## 2019-06-02 ENCOUNTER — Inpatient Hospital Stay: Payer: Medicare HMO | Attending: Hematology | Admitting: Hematology

## 2019-06-02 VITALS — BP 127/87 | HR 93 | Temp 97.4°F | Resp 18 | Ht 63.0 in | Wt 100.9 lb

## 2019-06-02 DIAGNOSIS — G629 Polyneuropathy, unspecified: Secondary | ICD-10-CM | POA: Insufficient documentation

## 2019-06-02 DIAGNOSIS — Z79899 Other long term (current) drug therapy: Secondary | ICD-10-CM | POA: Diagnosis not present

## 2019-06-02 DIAGNOSIS — I129 Hypertensive chronic kidney disease with stage 1 through stage 4 chronic kidney disease, or unspecified chronic kidney disease: Secondary | ICD-10-CM | POA: Insufficient documentation

## 2019-06-02 DIAGNOSIS — Z171 Estrogen receptor negative status [ER-]: Secondary | ICD-10-CM

## 2019-06-02 DIAGNOSIS — E785 Hyperlipidemia, unspecified: Secondary | ICD-10-CM | POA: Insufficient documentation

## 2019-06-02 DIAGNOSIS — N189 Chronic kidney disease, unspecified: Secondary | ICD-10-CM | POA: Diagnosis not present

## 2019-06-02 DIAGNOSIS — T380X5A Adverse effect of glucocorticoids and synthetic analogues, initial encounter: Secondary | ICD-10-CM | POA: Insufficient documentation

## 2019-06-02 DIAGNOSIS — R739 Hyperglycemia, unspecified: Secondary | ICD-10-CM | POA: Insufficient documentation

## 2019-06-02 DIAGNOSIS — C50411 Malignant neoplasm of upper-outer quadrant of right female breast: Secondary | ICD-10-CM

## 2019-06-02 MED ORDER — DIPHENHYDRAMINE HCL 25 MG PO CAPS
50.0000 mg | ORAL_CAPSULE | Freq: Once | ORAL | Status: AC
  Administered 2019-06-02: 50 mg via ORAL

## 2019-06-02 MED ORDER — ACETAMINOPHEN 325 MG PO TABS
650.0000 mg | ORAL_TABLET | Freq: Once | ORAL | Status: AC
  Administered 2019-06-02: 650 mg via ORAL

## 2019-06-02 MED ORDER — SODIUM CHLORIDE 0.9 % IV SOLN
Freq: Once | INTRAVENOUS | Status: AC
  Administered 2019-06-02: 09:00:00 via INTRAVENOUS
  Filled 2019-06-02: qty 250

## 2019-06-02 MED ORDER — SODIUM CHLORIDE 0.9% FLUSH
10.0000 mL | INTRAVENOUS | Status: DC | PRN
  Administered 2019-06-02: 10 mL
  Filled 2019-06-02: qty 10

## 2019-06-02 MED ORDER — TRASTUZUMAB-ANNS CHEMO 150 MG IV SOLR
8.0000 mg/kg | Freq: Once | INTRAVENOUS | Status: AC
  Administered 2019-06-02: 357 mg via INTRAVENOUS
  Filled 2019-06-02: qty 17

## 2019-06-02 MED ORDER — DIPHENHYDRAMINE HCL 25 MG PO CAPS
ORAL_CAPSULE | ORAL | Status: AC
  Filled 2019-06-02: qty 2

## 2019-06-02 MED ORDER — HEPARIN SOD (PORK) LOCK FLUSH 100 UNIT/ML IV SOLN
500.0000 [IU] | Freq: Once | INTRAVENOUS | Status: AC | PRN
  Administered 2019-06-02: 500 [IU]
  Filled 2019-06-02: qty 5

## 2019-06-02 MED ORDER — ACETAMINOPHEN 325 MG PO TABS
ORAL_TABLET | ORAL | Status: AC
  Filled 2019-06-02: qty 2

## 2019-06-02 NOTE — Patient Instructions (Signed)
[facility] °Discharge Instructions for Patients Receiving Chemotherapy ° °Today you received the following chemotherapy agents Trastuzumab ° °To help prevent nausea and vomiting after your treatment, we encourage you to take your nausea medication as directed. °  °If you develop nausea and vomiting that is not controlled by your nausea medication, call the clinic.  ° °BELOW ARE SYMPTOMS THAT SHOULD BE REPORTED IMMEDIATELY: °· *FEVER GREATER THAN 100.5 F °· *CHILLS WITH OR WITHOUT FEVER °· NAUSEA AND VOMITING THAT IS NOT CONTROLLED WITH YOUR NAUSEA MEDICATION °· *UNUSUAL SHORTNESS OF BREATH °· *UNUSUAL BRUISING OR BLEEDING °· TENDERNESS IN MOUTH AND THROAT WITH OR WITHOUT PRESENCE OF ULCERS °· *URINARY PROBLEMS °· *BOWEL PROBLEMS °· UNUSUAL RASH °Items with * indicate a potential emergency and should be followed up as soon as possible. ° °Feel free to call the clinic should you have any questions or concerns. The clinic phone number is [phone]. ° °Please show the CHEMO ALERT CARD at check-in to the Emergency Department and triage nurse. ° ° °

## 2019-06-02 NOTE — Progress Notes (Signed)
Reload trastuzumab today per Dr. Burr Medico. No Perjeta.   Demetrius Charity, PharmD, [facility] Pharmacist Pharmacy Phone: [phone] 06/02/2019

## 2019-06-03 ENCOUNTER — Telehealth: Payer: Self-pay | Admitting: Hematology

## 2019-06-03 NOTE — Telephone Encounter (Signed)
Scheduled appt per 1/22 los.  Spoke with pt and she is aware of the appt date and time. 

## 2019-06-04 ENCOUNTER — Encounter: Payer: Self-pay | Admitting: Cardiology

## 2019-06-04 NOTE — Progress Notes (Signed)
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  Receptor Status: ER(NEG), PR (NEG), Her2-neu (POS), Ki-(30%)  12/24/18 Diagnosis Breast, right, needle core biopsy, upper outer quadrant, peripheral to the recently biopsy proven breast carcinoma - INVASIVE DUCTAL CARCINOMA. SEE COMMENT.  Receptor Status: ER(NEG), PR(NEG), Her 2 (POS), Ki (40%)  05/19/19  FINAL MICROSCOPIC DIAGNOSIS: A. BREAST, RIGHT LATERAL SATELLITE LESION, LUMPECTOMY: - No residual invasive carcinoma status post neoadjuvant treatment. - One intraparenchymal lymph node, negative for carcinoma (0/1). - See oncology table. B. BREAST, RIGHT DOMINANT MASS, LUMPECTOMY: - No residual invasive carcinoma status post neoadjuvant treatment. - Biopsy site changes. - See oncology table. C. SENTINEL LYMPH NODE, RIGHT AXILLARY, EXCISION: - One lymph node, negative for carcinoma (0/1). D. SENTINEL LYMPH NODE, RIGHT AXILLARY, EXCISION: - One lymph node, negative for carcinoma (0/1). E. SENTINEL LYMPH NODE, RIGHT AXILLARY, EXCISION: - One lymph node, negative for carcinoma (0/1). F. SENTINEL LYMPH NODE, RIGHT AXILLARY, EXCISION: - One lymph node, negative for carcinoma (0/1).  Did patient present with symptoms or was this found on screening mammography?: She had a palpable right breast mass for 3 months.   Past/Anticipated interventions by surgeon, if any: 05/19/19 Dr. Barry Dienes Right Breast Radioactive seed localized lumpectomy x 2 and sentinel lymph node mapping and biopsy  Past/Anticipated interventions by medical oncology, if any:  06/02/19 Dr. Burr Medico PLAN: -Labs reviewed and adequate to proceed with Herceptin today, will reload since her last treatment was 29 days ago -Send Rad Onc Referral to Dr Isidore Moos  -Lab, flush, Herceptin in 3, 6, 9 weeks  -F/u in 9 weeks  Lymphedema  issues, if any:  She denies.   Pain issues, if any:  She reports shooting pains every now and then, and tenderness at the surgery site.   SAFETY ISSUES:  Prior radiation? No  Pacemaker/ICD? No  Possible current pregnancy? No  Is the patient on methotrexate? No  Current Complaints / other details:      Chaz Ronning, Stephani Police, RN 06/04/2019,3:48 PM

## 2019-06-08 ENCOUNTER — Ambulatory Visit
Admission: RE | Admit: 2019-06-08 | Discharge: 2019-06-08 | Disposition: A | Discharge status: Home / Self Care | Payer: Medicare HMO | Source: Ambulatory Visit | Attending: Radiation Oncology | Admitting: Radiation Oncology

## 2019-06-08 ENCOUNTER — Other Ambulatory Visit: Payer: Self-pay

## 2019-06-08 ENCOUNTER — Encounter: Payer: Self-pay | Admitting: Radiation Oncology

## 2019-06-08 DIAGNOSIS — C50411 Malignant neoplasm of upper-outer quadrant of right female breast: Secondary | ICD-10-CM | POA: Diagnosis not present

## 2019-06-08 DIAGNOSIS — Z171 Estrogen receptor negative status [ER-]: Secondary | ICD-10-CM | POA: Diagnosis not present

## 2019-06-08 DIAGNOSIS — Z79899 Other long term (current) drug therapy: Secondary | ICD-10-CM | POA: Diagnosis not present

## 2019-06-08 DIAGNOSIS — Z9889 Other specified postprocedural states: Secondary | ICD-10-CM | POA: Diagnosis not present

## 2019-06-08 NOTE — Progress Notes (Signed)
Radiation Oncology         [phone] ________________________________  Name: GORDIE BELVIN MRN: [id number]  Date: 06/08/2019  DOB: 1951-01-08  Follow-Up Telephone Note by telephone as patient was unable to access MyChart video during pandemic precautions   Outpatient  CC: Deland Pretty, MD  [name], MD  Diagnosis:      ICD-10-CM   1. Malignant neoplasm of upper-outer quadrant of right breast in female, estrogen receptor negative ([place])  C50.411    Z17.1      Cancer Staging Malignant neoplasm of upper-outer quadrant of right female breast [facility]) Staging form: Breast, AJCC 8th Edition - Clinical stage from 12/09/2018: Stage IIA (cT2, cN0, cM0, G3, ER-, PR-, HER2+) - Signed by [name], MD on 12/15/2018 - Pathologic stage from 05/19/2019: No Stage Recommended (ypT0, pN0, cM0, GX, ER: Not Assessed, PR: Not Assessed, HER2: Not Assessed) - Signed by [name], MD on 06/01/2019   CHIEF COMPLAINT: Here to discuss management of right breast cancer  Narrative:  The patient returns today for follow-up to discuss radiation treatment options. She was seen in the multidisciplinary breast clinic on 12/16/2018.     Since consultation date, she underwent breast MRI on 12/21/2018 revealing: approximate 4 cm mass involving upper right breast with extension into the upper-inner and -outer quadrants, predominantly upper-inner, and abuts the chest wall; 1 cm superficial satellite mass vs pathologic intramammary lymph node in the upper-outer right breast, located lateral to dominant mass; no evidence of left breast malignancy; no pathologic lymphadenopathy otherwise.  She proceeded to biopsy of the peripheral satellite mass on 12/24/2018. Pathology showed: invasive ductal carcinoma; ER negative, PR negative, Her2 positive.  She is being treated with systemic therapy by Dr. Burr Medico: neoadjuvant chemotherapy with TCHP x5 cycles from 01/06/2019 through 03/30/2019. She is currently on maintenance Herceptin since  05/04/2019.  Post-treatment breast MRI on 04/15/2019 showed: interval resolution of the two enhancing masses, corresponding with biopsy-proven malignancy; no residual or new suspicious enhancement.  She opted to proceed with right lumpectomy x2 and sentinel lymph node biopsy on date of 05/19/2019 with pathology report revealing: no residual carcinoma; nodal status of negative (0/5).  Lymphedema issues, if any:  She denies.   Pain issues, if any:  She reports shooting pains every now and then, and tenderness at the surgery site.   SAFETY ISSUES:  Prior radiation? No  Pacemaker/ICD? No  Possible current pregnancy? No  Is the patient on methotrexate? No         ALLERGIES:  has No Known Allergies.  Meds: Current Outpatient Medications  Medication Sig Dispense Refill   atorvastatin (LIPITOR) 20 MG tablet Take 20 mg by mouth daily.     pantoprazole (PROTONIX) 20 MG tablet TAKE 1 TABLET BY MOUTH EVERY DAY 30 tablet 1   potassium chloride SA (KLOR-CON) 20 MEQ tablet Take 1 tablet (20 mEq total) by mouth every other day. 30 tablet 1   Current Facility-Administered Medications  Medication Dose Route Frequency Provider Last Rate Last Dose   0.9 %  sodium chloride infusion  500 mL Intravenous Once Nandigam, Venia Minks, MD        Physical Findings:  vitals were not taken for this visit. .     Lab Findings: Lab Results  Component Value Date   WBC 3.0 (L) 05/14/2019   HGB 12.3 05/14/2019   HCT 39.1 05/14/2019   MCV 102.6 (H) 05/14/2019   PLT 170 05/14/2019    Radiographic Findings: Nm Sentinel Node Inj-no Rpt (  breast)  Result Date: 05/19/2019 Sulfur colloid was injected by the nuclear medicine technologist for melanoma sentinel node.   Mm Breast Surgical Specimen  Result Date: 05/19/2019 CLINICAL DATA:  Status post right breast lumpectomy. EXAM: SPECIMEN RADIOGRAPH OF THE RIGHT BREAST COMPARISON:  Previous exam(s). FINDINGS: Status post excision of the right breast. The  radioactive seed and ribbon shaped biopsy marker clip are present, completely intact, and were marked for pathology. IMPRESSION: Specimen radiograph of the right breast. Electronically Signed   By: Lillia Mountain M.D.   On: 05/19/2019 16:33   Mm Breast Surgical Specimen  Result Date: 05/19/2019 CLINICAL DATA:  Status post right breast lumpectomy. EXAM: SPECIMEN RADIOGRAPH OF THE RIGHT BREAST COMPARISON:  Previous exam(s). FINDINGS: Status post excision of the right breast. The radioactive seed and coil shaped biopsy marker clip are present, completely intact, and were marked for pathology. IMPRESSION: Specimen radiograph of the right breast. Electronically Signed   By: Lillia Mountain M.D.   On: 05/19/2019 16:23   Mm Rt Radioactive Seed Loc Mammo Guide  Result Date: 05/18/2019 CLINICAL DATA:  Patient for preoperative localization prior to right breast lumpectomy, 2 sites. EXAM: MAMMOGRAPHIC GUIDED RADIOACTIVE SEED LOCALIZATION OF THE RIGHT BREAST COMPARISON:  Previous exam(s). FINDINGS: Patient presents for radioactive seed localization prior to right breast lumpectomy, 2 sites. I met with the patient and we discussed the procedure of seed localization including benefits and alternatives. We discussed the high likelihood of a successful procedure. We discussed the risks of the procedure including infection, bleeding, tissue injury and further surgery. We discussed the low dose of radioactivity involved in the procedure. Informed, written consent was given. The usual time-out protocol was performed immediately prior to the procedure. Site 1: Ribbon shaped clip. Using mammographic guidance, sterile technique, 1% lidocaine and an I-125 radioactive seed, ribbon shaped clip was localized using a cranial approach. The follow-up mammogram images confirm the seed in the expected location and were marked for Dr. Barry Dienes. Follow-up survey of the patient confirms presence of the radioactive seed. Order number of I-125 seed:   [id number]. Total activity:  4.401 millicuries reference Date: 05/11/2019 Site 2: Coil shaped clip. Using mammographic guidance, sterile technique, 1% lidocaine and an I-125 radioactive seed, coil shaped clip was localized using a lateral approach. The follow-up mammogram images confirm the seed in the expected location and were marked for Dr. Barry Dienes. Follow-up survey of the patient confirms presence of the radioactive seed. Order number of I-125 seed:  [id number]. Total activity:  4.034 millicuries reference Date: 05/11/2019 The patient tolerated the procedure well and was released from the [place]. She was given instructions regarding seed removal. IMPRESSION: Radioactive seed localization right breast, 2 sites. No apparent complications. Electronically Signed   By: Lovey Newcomer M.D.   On: 05/18/2019 15:53   Mm Rt Radio Seed Ea Add Lesion Loc Mammo  Result Date: 05/18/2019 CLINICAL DATA:  Patient for preoperative localization prior to right breast lumpectomy, 2 sites. EXAM: MAMMOGRAPHIC GUIDED RADIOACTIVE SEED LOCALIZATION OF THE RIGHT BREAST COMPARISON:  Previous exam(s). FINDINGS: Patient presents for radioactive seed localization prior to right breast lumpectomy, 2 sites. I met with the patient and we discussed the procedure of seed localization including benefits and alternatives. We discussed the high likelihood of a successful procedure. We discussed the risks of the procedure including infection, bleeding, tissue injury and further surgery. We discussed the low dose of radioactivity involved in the procedure. Informed, written consent was given. The usual time-out protocol was performed immediately prior  to the procedure. Site 1: Ribbon shaped clip. Using mammographic guidance, sterile technique, 1% lidocaine and an I-125 radioactive seed, ribbon shaped clip was localized using a cranial approach. The follow-up mammogram images confirm the seed in the expected location and were marked for Dr.  Barry Dienes. Follow-up survey of the patient confirms presence of the radioactive seed. Order number of I-125 seed:  [id number]. Total activity:  8.270 millicuries reference Date: 05/11/2019 Site 2: Coil shaped clip. Using mammographic guidance, sterile technique, 1% lidocaine and an I-125 radioactive seed, coil shaped clip was localized using a lateral approach. The follow-up mammogram images confirm the seed in the expected location and were marked for Dr. Barry Dienes. Follow-up survey of the patient confirms presence of the radioactive seed. Order number of I-125 seed:  [id number]. Total activity:  0.100 millicuries reference Date: 05/11/2019 The patient tolerated the procedure well and was released from the [place]. She was given instructions regarding seed removal. IMPRESSION: Radioactive seed localization right breast, 2 sites. No apparent complications. Electronically Signed   By: Lovey Newcomer M.D.   On: 05/18/2019 15:53    Impression/Plan: Right Breast Cancer   We discussed adjuvant radiotherapy today.  I recommend 4 weeks directed at the right breast in order to reduce the risk of locoregional recurrence by 2/3.  I plan to treat with high tangents to give additional coverage to axillary tissue; the risks, benefits and side effects of this treatment were discussed in detail.  She understands that radiotherapy is associated with skin irritation and fatigue in the acute setting. Late effects can include cosmetic changes and rare injury to internal organs.  She is enthusiastic about proceeding with treatment.   We will proceed with CT simulation in the near future.  This encounter was provided by telemedicine platform by telephone as patient was unable to access MyChart video during pandemic precautions The patient has given verbal consent for this type of encounter and has been advised to only accept a meeting of this type in a secure network environment. The time spent during this encounter was 25  minutes. The attendants for this meeting include [name]  and Yalda Herd Furman.  During the encounter, [name] was located at [facility] Radiation Oncology Department.  Iretta Mangrum Dwight was located at home.   _____________________________________   [name], MD   This document serves as a record of services personally performed by [name], MD. It was created on her behalf by Wilburn Mylar, a trained medical scribe. The creation of this record is based on the scribe's personal observations and the provider's statements to them. This document has been checked and approved by the attending provider.

## 2019-06-09 ENCOUNTER — Encounter: Payer: Self-pay | Admitting: Radiation Oncology

## 2019-06-11 ENCOUNTER — Other Ambulatory Visit: Payer: Self-pay

## 2019-06-11 ENCOUNTER — Ambulatory Visit
Admission: RE | Admit: 2019-06-11 | Discharge: 2019-06-11 | Disposition: A | Discharge status: Home / Self Care | Payer: Medicare HMO | Source: Ambulatory Visit | Attending: Radiation Oncology | Admitting: Radiation Oncology

## 2019-06-11 DIAGNOSIS — C50411 Malignant neoplasm of upper-outer quadrant of right female breast: Secondary | ICD-10-CM | POA: Diagnosis not present

## 2019-06-11 DIAGNOSIS — Z510 Encounter for antineoplastic radiation therapy: Secondary | ICD-10-CM | POA: Diagnosis present

## 2019-06-11 DIAGNOSIS — Z171 Estrogen receptor negative status [ER-]: Secondary | ICD-10-CM | POA: Insufficient documentation

## 2019-06-11 NOTE — Progress Notes (Signed)
  Radiation Oncology         (336) [phone] ________________________________  Name: Christina Sexton MRN: [id number]  Date: 06/11/2019  DOB: [birth year]  SIMULATION AND TREATMENT PLANNING NOTE    Outpatient  DIAGNOSIS:     ICD-10-CM   1. Malignant neoplasm of upper-outer quadrant of right breast in female, estrogen receptor negative ([place])  C50.411    Z17.1     NARRATIVE:  The patient was brought to the [place].  Identity was confirmed.  All relevant records and images related to the planned course of therapy were reviewed.  The patient freely provided informed written consent to proceed with treatment after reviewing the details related to the planned course of therapy. The consent form was witnessed and verified by the simulation staff.    Then, the patient was set-up in a stable reproducible supine position for radiation therapy with her ipsilateral arm over her head, and her upper body secured in a custom-made Vac-lok device.  CT images were obtained.  Surface markings were placed.  The CT images were loaded into the planning software.    TREATMENT PLANNING NOTE: Treatment planning then occurred.  The radiation prescription was entered and confirmed.     A total of 3 medically necessary complex treatment devices were fabricated and supervised by me: 2 fields with MLCs for custom blocks to protect heart, and lungs;  and, a Vac-lok. MORE COMPLEX DEVICES MAY BE MADE IN DOSIMETRY FOR FIELD IN FIELD BEAMS FOR DOSE HOMOGENEITY.  I have requested : 3D Simulation which is medically necessary to give adequate dose to at risk tissues while sparing lungs and heart.  I have requested a DVH of the following structures: lungs, heart, right lumpectomy cavity.    The patient will receive 40.05 Gy in 15 fractions to the right breast and axilla with 2 high tangential fields.  This will be followed by a boost.  Optical Surface Tracking Plan:  Since intensity modulated radiotherapy (IMRT)  and 3D conformal radiation treatment methods are predicated on accurate and precise positioning for treatment, intrafraction motion monitoring is medically necessary to ensure accurate and safe treatment delivery. The ability to quantify intrafraction motion without excessive ionizing radiation dose can only be performed with optical surface tracking. Accordingly, surface imaging offers the opportunity to obtain 3D measurements of patient position throughout IMRT and 3D treatments without excessive radiation exposure. I am ordering optical surface tracking for this patient's upcoming course of radiotherapy.  ________________________________   Reference:  Ursula Alert, J, et al. Surface imaging-based analysis of intrafraction motion for breast radiotherapy patients.Journal of [place], n. 6, nov. 2014. ISSN [id number].  Available at: <http://www.jacmp.org/index.php/jacmp/article/view/4957>.    -----------------------------------  [name], MD

## 2019-06-15 DIAGNOSIS — Z510 Encounter for antineoplastic radiation therapy: Secondary | ICD-10-CM | POA: Diagnosis not present

## 2019-06-15 DIAGNOSIS — Z171 Estrogen receptor negative status [ER-]: Secondary | ICD-10-CM | POA: Diagnosis not present

## 2019-06-15 DIAGNOSIS — C50411 Malignant neoplasm of upper-outer quadrant of right female breast: Secondary | ICD-10-CM | POA: Diagnosis not present

## 2019-06-16 ENCOUNTER — Ambulatory Visit
Admission: RE | Admit: 2019-06-16 | Discharge: 2019-06-16 | Disposition: A | Discharge status: Home / Self Care | Payer: Medicare HMO | Source: Ambulatory Visit | Attending: Radiation Oncology | Admitting: Radiation Oncology

## 2019-06-16 ENCOUNTER — Other Ambulatory Visit: Payer: Self-pay

## 2019-06-16 ENCOUNTER — Ambulatory Visit: Payer: Medicare HMO | Admitting: Radiation Oncology

## 2019-06-16 ENCOUNTER — Ambulatory Visit: Payer: Medicare HMO

## 2019-06-16 DIAGNOSIS — Z171 Estrogen receptor negative status [ER-]: Secondary | ICD-10-CM | POA: Diagnosis not present

## 2019-06-16 DIAGNOSIS — Z510 Encounter for antineoplastic radiation therapy: Secondary | ICD-10-CM | POA: Diagnosis not present

## 2019-06-16 DIAGNOSIS — C50411 Malignant neoplasm of upper-outer quadrant of right female breast: Secondary | ICD-10-CM | POA: Diagnosis not present

## 2019-06-17 ENCOUNTER — Other Ambulatory Visit: Payer: Self-pay

## 2019-06-17 ENCOUNTER — Ambulatory Visit
Admission: RE | Admit: 2019-06-17 | Discharge: 2019-06-17 | Disposition: A | Discharge status: Home / Self Care | Payer: Medicare HMO | Source: Ambulatory Visit | Attending: Radiation Oncology | Admitting: Radiation Oncology

## 2019-06-17 DIAGNOSIS — Z171 Estrogen receptor negative status [ER-]: Secondary | ICD-10-CM | POA: Diagnosis not present

## 2019-06-17 DIAGNOSIS — C50411 Malignant neoplasm of upper-outer quadrant of right female breast: Secondary | ICD-10-CM | POA: Diagnosis not present

## 2019-06-17 DIAGNOSIS — Z510 Encounter for antineoplastic radiation therapy: Secondary | ICD-10-CM | POA: Diagnosis not present

## 2019-06-17 MED ORDER — ALRA NON-METALLIC DEODORANT (RAD-ONC)
1.0000 "application " | Freq: Once | TOPICAL | Status: AC
  Administered 2019-06-17: 1 via TOPICAL

## 2019-06-17 MED ORDER — SONAFINE EX EMUL
1.0000 "application " | Freq: Once | CUTANEOUS | Status: AC
  Administered 2019-06-17: 1 via TOPICAL

## 2019-06-17 NOTE — Progress Notes (Signed)

## 2019-06-18 ENCOUNTER — Other Ambulatory Visit: Payer: Self-pay

## 2019-06-18 ENCOUNTER — Ambulatory Visit
Admission: RE | Admit: 2019-06-18 | Discharge: 2019-06-18 | Disposition: A | Discharge status: Home / Self Care | Payer: Medicare HMO | Source: Ambulatory Visit | Attending: Radiation Oncology | Admitting: Radiation Oncology

## 2019-06-18 DIAGNOSIS — C50411 Malignant neoplasm of upper-outer quadrant of right female breast: Secondary | ICD-10-CM | POA: Diagnosis not present

## 2019-06-18 DIAGNOSIS — Z510 Encounter for antineoplastic radiation therapy: Secondary | ICD-10-CM | POA: Diagnosis not present

## 2019-06-18 DIAGNOSIS — Z171 Estrogen receptor negative status [ER-]: Secondary | ICD-10-CM | POA: Diagnosis not present

## 2019-06-21 ENCOUNTER — Other Ambulatory Visit: Payer: Self-pay

## 2019-06-21 ENCOUNTER — Ambulatory Visit
Admission: RE | Admit: 2019-06-21 | Discharge: 2019-06-21 | Disposition: A | Discharge status: Home / Self Care | Payer: Medicare HMO | Source: Ambulatory Visit | Attending: Radiation Oncology | Admitting: Radiation Oncology

## 2019-06-21 DIAGNOSIS — Z510 Encounter for antineoplastic radiation therapy: Secondary | ICD-10-CM | POA: Diagnosis not present

## 2019-06-21 DIAGNOSIS — Z171 Estrogen receptor negative status [ER-]: Secondary | ICD-10-CM | POA: Diagnosis not present

## 2019-06-21 DIAGNOSIS — C50411 Malignant neoplasm of upper-outer quadrant of right female breast: Secondary | ICD-10-CM | POA: Diagnosis not present

## 2019-06-22 ENCOUNTER — Other Ambulatory Visit: Payer: Self-pay

## 2019-06-22 ENCOUNTER — Ambulatory Visit
Admission: RE | Admit: 2019-06-22 | Discharge: 2019-06-22 | Disposition: A | Discharge status: Home / Self Care | Payer: Medicare HMO | Source: Ambulatory Visit | Attending: Radiation Oncology | Admitting: Radiation Oncology

## 2019-06-22 DIAGNOSIS — Z510 Encounter for antineoplastic radiation therapy: Secondary | ICD-10-CM | POA: Diagnosis not present

## 2019-06-22 DIAGNOSIS — C50411 Malignant neoplasm of upper-outer quadrant of right female breast: Secondary | ICD-10-CM | POA: Diagnosis not present

## 2019-06-22 DIAGNOSIS — Z171 Estrogen receptor negative status [ER-]: Secondary | ICD-10-CM | POA: Diagnosis not present

## 2019-06-23 ENCOUNTER — Ambulatory Visit
Admission: RE | Admit: 2019-06-23 | Discharge: 2019-06-23 | Disposition: A | Discharge status: Home / Self Care | Payer: Medicare HMO | Source: Ambulatory Visit | Attending: Radiation Oncology | Admitting: Radiation Oncology

## 2019-06-23 ENCOUNTER — Inpatient Hospital Stay: Payer: Medicare HMO

## 2019-06-23 ENCOUNTER — Other Ambulatory Visit: Payer: Self-pay

## 2019-06-23 VITALS — BP 149/73 | HR 80 | Temp 97.8°F | Resp 18

## 2019-06-23 DIAGNOSIS — R739 Hyperglycemia, unspecified: Secondary | ICD-10-CM | POA: Diagnosis not present

## 2019-06-23 DIAGNOSIS — Z510 Encounter for antineoplastic radiation therapy: Secondary | ICD-10-CM | POA: Diagnosis not present

## 2019-06-23 DIAGNOSIS — E785 Hyperlipidemia, unspecified: Secondary | ICD-10-CM | POA: Diagnosis not present

## 2019-06-23 DIAGNOSIS — G629 Polyneuropathy, unspecified: Secondary | ICD-10-CM | POA: Diagnosis not present

## 2019-06-23 DIAGNOSIS — T380X5A Adverse effect of glucocorticoids and synthetic analogues, initial encounter: Secondary | ICD-10-CM | POA: Diagnosis not present

## 2019-06-23 DIAGNOSIS — Z79899 Other long term (current) drug therapy: Secondary | ICD-10-CM | POA: Diagnosis not present

## 2019-06-23 DIAGNOSIS — Z171 Estrogen receptor negative status [ER-]: Secondary | ICD-10-CM | POA: Diagnosis not present

## 2019-06-23 DIAGNOSIS — N189 Chronic kidney disease, unspecified: Secondary | ICD-10-CM | POA: Diagnosis not present

## 2019-06-23 DIAGNOSIS — I129 Hypertensive chronic kidney disease with stage 1 through stage 4 chronic kidney disease, or unspecified chronic kidney disease: Secondary | ICD-10-CM | POA: Diagnosis not present

## 2019-06-23 DIAGNOSIS — C50411 Malignant neoplasm of upper-outer quadrant of right female breast: Secondary | ICD-10-CM | POA: Diagnosis not present

## 2019-06-23 MED ORDER — ACETAMINOPHEN 325 MG PO TABS
650.0000 mg | ORAL_TABLET | Freq: Once | ORAL | Status: AC
  Administered 2019-06-23: 650 mg via ORAL

## 2019-06-23 MED ORDER — DIPHENHYDRAMINE HCL 25 MG PO CAPS
50.0000 mg | ORAL_CAPSULE | Freq: Once | ORAL | Status: AC
  Administered 2019-06-23: 50 mg via ORAL

## 2019-06-23 MED ORDER — HEPARIN SOD (PORK) LOCK FLUSH 100 UNIT/ML IV SOLN
500.0000 [IU] | Freq: Once | INTRAVENOUS | Status: AC | PRN
  Administered 2019-06-23: 500 [IU]
  Filled 2019-06-23: qty 5

## 2019-06-23 MED ORDER — DIPHENHYDRAMINE HCL 25 MG PO CAPS
ORAL_CAPSULE | ORAL | Status: AC
  Filled 2019-06-23: qty 2

## 2019-06-23 MED ORDER — SODIUM CHLORIDE 0.9% FLUSH
10.0000 mL | INTRAVENOUS | Status: DC | PRN
  Administered 2019-06-23: 10 mL
  Filled 2019-06-23: qty 10

## 2019-06-23 MED ORDER — TRASTUZUMAB-ANNS CHEMO 150 MG IV SOLR
300.0000 mg | Freq: Once | INTRAVENOUS | Status: AC
  Administered 2019-06-23: 300 mg via INTRAVENOUS
  Filled 2019-06-23: qty 14.29

## 2019-06-23 MED ORDER — ACETAMINOPHEN 325 MG PO TABS
ORAL_TABLET | ORAL | Status: AC
  Filled 2019-06-23: qty 2

## 2019-06-23 MED ORDER — SODIUM CHLORIDE 0.9 % IV SOLN
Freq: Once | INTRAVENOUS | Status: AC
  Filled 2019-06-23: qty 250

## 2019-06-23 NOTE — Patient Instructions (Signed)
[facility] °Discharge Instructions for Patients Receiving Chemotherapy ° °Today you received the following chemotherapy agents Trastuzumab ° °To help prevent nausea and vomiting after your treatment, we encourage you to take your nausea medication as directed. °  °If you develop nausea and vomiting that is not controlled by your nausea medication, call the clinic.  ° °BELOW ARE SYMPTOMS THAT SHOULD BE REPORTED IMMEDIATELY: °· *FEVER GREATER THAN 100.5 F °· *CHILLS WITH OR WITHOUT FEVER °· NAUSEA AND VOMITING THAT IS NOT CONTROLLED WITH YOUR NAUSEA MEDICATION °· *UNUSUAL SHORTNESS OF BREATH °· *UNUSUAL BRUISING OR BLEEDING °· TENDERNESS IN MOUTH AND THROAT WITH OR WITHOUT PRESENCE OF ULCERS °· *URINARY PROBLEMS °· *BOWEL PROBLEMS °· UNUSUAL RASH °Items with * indicate a potential emergency and should be followed up as soon as possible. ° °Feel free to call the clinic should you have any questions or concerns. The clinic phone number is [phone]. ° °Please show the CHEMO ALERT CARD at check-in to the Emergency Department and triage nurse. ° ° °

## 2019-06-24 ENCOUNTER — Ambulatory Visit
Admission: RE | Admit: 2019-06-24 | Discharge: 2019-06-24 | Disposition: A | Discharge status: Home / Self Care | Payer: Medicare HMO | Source: Ambulatory Visit | Attending: Radiation Oncology | Admitting: Radiation Oncology

## 2019-06-24 ENCOUNTER — Other Ambulatory Visit: Payer: Self-pay

## 2019-06-24 DIAGNOSIS — Z171 Estrogen receptor negative status [ER-]: Secondary | ICD-10-CM | POA: Diagnosis not present

## 2019-06-24 DIAGNOSIS — C50411 Malignant neoplasm of upper-outer quadrant of right female breast: Secondary | ICD-10-CM | POA: Diagnosis not present

## 2019-06-24 DIAGNOSIS — Z510 Encounter for antineoplastic radiation therapy: Secondary | ICD-10-CM | POA: Diagnosis not present

## 2019-06-28 ENCOUNTER — Other Ambulatory Visit: Payer: Self-pay

## 2019-06-28 ENCOUNTER — Ambulatory Visit
Admission: RE | Admit: 2019-06-28 | Discharge: 2019-06-28 | Disposition: A | Discharge status: Home / Self Care | Payer: Medicare HMO | Source: Ambulatory Visit | Attending: Radiation Oncology | Admitting: Radiation Oncology

## 2019-06-28 DIAGNOSIS — C50411 Malignant neoplasm of upper-outer quadrant of right female breast: Secondary | ICD-10-CM | POA: Diagnosis not present

## 2019-06-28 DIAGNOSIS — Z171 Estrogen receptor negative status [ER-]: Secondary | ICD-10-CM | POA: Diagnosis not present

## 2019-06-28 DIAGNOSIS — Z510 Encounter for antineoplastic radiation therapy: Secondary | ICD-10-CM | POA: Diagnosis not present

## 2019-06-29 ENCOUNTER — Encounter: Payer: Self-pay | Admitting: *Deleted

## 2019-06-29 ENCOUNTER — Other Ambulatory Visit: Payer: Self-pay

## 2019-06-29 ENCOUNTER — Ambulatory Visit
Admission: RE | Admit: 2019-06-29 | Discharge: 2019-06-29 | Disposition: A | Discharge status: Home / Self Care | Payer: Medicare HMO | Source: Ambulatory Visit | Attending: Radiation Oncology | Admitting: Radiation Oncology

## 2019-06-29 DIAGNOSIS — Z510 Encounter for antineoplastic radiation therapy: Secondary | ICD-10-CM | POA: Diagnosis not present

## 2019-06-29 DIAGNOSIS — C50411 Malignant neoplasm of upper-outer quadrant of right female breast: Secondary | ICD-10-CM | POA: Diagnosis not present

## 2019-06-29 DIAGNOSIS — Z171 Estrogen receptor negative status [ER-]: Secondary | ICD-10-CM | POA: Diagnosis not present

## 2019-06-30 ENCOUNTER — Other Ambulatory Visit: Payer: Self-pay

## 2019-06-30 ENCOUNTER — Ambulatory Visit
Admission: RE | Admit: 2019-06-30 | Discharge: 2019-06-30 | Disposition: A | Discharge status: Home / Self Care | Payer: Medicare HMO | Source: Ambulatory Visit | Attending: Radiation Oncology | Admitting: Radiation Oncology

## 2019-06-30 DIAGNOSIS — Z510 Encounter for antineoplastic radiation therapy: Secondary | ICD-10-CM | POA: Diagnosis not present

## 2019-06-30 DIAGNOSIS — Z171 Estrogen receptor negative status [ER-]: Secondary | ICD-10-CM | POA: Diagnosis not present

## 2019-06-30 DIAGNOSIS — C50411 Malignant neoplasm of upper-outer quadrant of right female breast: Secondary | ICD-10-CM | POA: Diagnosis not present

## 2019-07-01 ENCOUNTER — Ambulatory Visit
Admission: RE | Admit: 2019-07-01 | Discharge: 2019-07-01 | Disposition: A | Discharge status: Home / Self Care | Payer: Medicare HMO | Source: Ambulatory Visit | Attending: Radiation Oncology | Admitting: Radiation Oncology

## 2019-07-01 ENCOUNTER — Other Ambulatory Visit: Payer: Self-pay

## 2019-07-01 DIAGNOSIS — Z171 Estrogen receptor negative status [ER-]: Secondary | ICD-10-CM | POA: Diagnosis not present

## 2019-07-01 DIAGNOSIS — Z510 Encounter for antineoplastic radiation therapy: Secondary | ICD-10-CM | POA: Diagnosis not present

## 2019-07-01 DIAGNOSIS — C50411 Malignant neoplasm of upper-outer quadrant of right female breast: Secondary | ICD-10-CM | POA: Diagnosis not present

## 2019-07-02 ENCOUNTER — Other Ambulatory Visit: Payer: Self-pay | Admitting: Hematology

## 2019-07-05 ENCOUNTER — Ambulatory Visit
Admission: RE | Admit: 2019-07-05 | Discharge: 2019-07-05 | Disposition: A | Discharge status: Home / Self Care | Payer: Medicare HMO | Source: Ambulatory Visit | Attending: Radiation Oncology | Admitting: Radiation Oncology

## 2019-07-05 ENCOUNTER — Other Ambulatory Visit: Payer: Self-pay

## 2019-07-05 DIAGNOSIS — C50411 Malignant neoplasm of upper-outer quadrant of right female breast: Secondary | ICD-10-CM | POA: Diagnosis not present

## 2019-07-05 DIAGNOSIS — Z510 Encounter for antineoplastic radiation therapy: Secondary | ICD-10-CM | POA: Diagnosis present

## 2019-07-05 DIAGNOSIS — Z171 Estrogen receptor negative status [ER-]: Secondary | ICD-10-CM | POA: Insufficient documentation

## 2019-07-06 ENCOUNTER — Ambulatory Visit
Admission: RE | Admit: 2019-07-06 | Discharge: 2019-07-06 | Disposition: A | Discharge status: Home / Self Care | Payer: Medicare HMO | Source: Ambulatory Visit | Attending: Radiation Oncology | Admitting: Radiation Oncology

## 2019-07-06 ENCOUNTER — Other Ambulatory Visit: Payer: Self-pay

## 2019-07-06 DIAGNOSIS — Z510 Encounter for antineoplastic radiation therapy: Secondary | ICD-10-CM | POA: Diagnosis not present

## 2019-07-06 DIAGNOSIS — C50411 Malignant neoplasm of upper-outer quadrant of right female breast: Secondary | ICD-10-CM | POA: Diagnosis not present

## 2019-07-07 ENCOUNTER — Ambulatory Visit
Admission: RE | Admit: 2019-07-07 | Discharge: 2019-07-07 | Disposition: A | Discharge status: Home / Self Care | Payer: Medicare HMO | Source: Ambulatory Visit | Attending: Radiation Oncology | Admitting: Radiation Oncology

## 2019-07-07 ENCOUNTER — Other Ambulatory Visit: Payer: Self-pay

## 2019-07-07 DIAGNOSIS — C50411 Malignant neoplasm of upper-outer quadrant of right female breast: Secondary | ICD-10-CM | POA: Diagnosis not present

## 2019-07-07 DIAGNOSIS — Z510 Encounter for antineoplastic radiation therapy: Secondary | ICD-10-CM | POA: Diagnosis not present

## 2019-07-08 ENCOUNTER — Other Ambulatory Visit: Payer: Self-pay

## 2019-07-08 ENCOUNTER — Ambulatory Visit
Admission: RE | Admit: 2019-07-08 | Discharge: 2019-07-08 | Disposition: A | Discharge status: Home / Self Care | Payer: Medicare HMO | Source: Ambulatory Visit | Attending: Radiation Oncology | Admitting: Radiation Oncology

## 2019-07-08 DIAGNOSIS — Z510 Encounter for antineoplastic radiation therapy: Secondary | ICD-10-CM | POA: Diagnosis not present

## 2019-07-08 DIAGNOSIS — C50411 Malignant neoplasm of upper-outer quadrant of right female breast: Secondary | ICD-10-CM | POA: Diagnosis not present

## 2019-07-09 ENCOUNTER — Other Ambulatory Visit: Payer: Self-pay

## 2019-07-09 ENCOUNTER — Ambulatory Visit
Admission: RE | Admit: 2019-07-09 | Discharge: 2019-07-09 | Disposition: A | Discharge status: Home / Self Care | Payer: Medicare HMO | Source: Ambulatory Visit | Attending: Radiation Oncology | Admitting: Radiation Oncology

## 2019-07-09 DIAGNOSIS — Z510 Encounter for antineoplastic radiation therapy: Secondary | ICD-10-CM | POA: Diagnosis not present

## 2019-07-12 ENCOUNTER — Other Ambulatory Visit: Payer: Self-pay

## 2019-07-12 ENCOUNTER — Ambulatory Visit
Admission: RE | Admit: 2019-07-12 | Discharge: 2019-07-12 | Disposition: A | Discharge status: Home / Self Care | Payer: Medicare HMO | Source: Ambulatory Visit | Attending: Radiation Oncology | Admitting: Radiation Oncology

## 2019-07-12 DIAGNOSIS — Z510 Encounter for antineoplastic radiation therapy: Secondary | ICD-10-CM | POA: Diagnosis not present

## 2019-07-13 ENCOUNTER — Ambulatory Visit
Admission: RE | Admit: 2019-07-13 | Discharge: 2019-07-13 | Disposition: A | Discharge status: Home / Self Care | Payer: Medicare HMO | Source: Ambulatory Visit | Attending: Radiation Oncology | Admitting: Radiation Oncology

## 2019-07-13 ENCOUNTER — Other Ambulatory Visit: Payer: Self-pay

## 2019-07-13 DIAGNOSIS — Z510 Encounter for antineoplastic radiation therapy: Secondary | ICD-10-CM | POA: Diagnosis not present

## 2019-07-14 ENCOUNTER — Ambulatory Visit: Payer: Medicare HMO

## 2019-07-14 ENCOUNTER — Ambulatory Visit
Admission: RE | Admit: 2019-07-14 | Discharge: 2019-07-14 | Disposition: A | Discharge status: Home / Self Care | Payer: Medicare HMO | Source: Ambulatory Visit | Attending: Radiation Oncology | Admitting: Radiation Oncology

## 2019-07-14 ENCOUNTER — Other Ambulatory Visit: Payer: Self-pay

## 2019-07-14 ENCOUNTER — Inpatient Hospital Stay: Payer: Medicare HMO | Attending: Hematology

## 2019-07-14 VITALS — BP 142/91 | HR 80 | Temp 98.6°F | Resp 16 | Ht 63.0 in | Wt 99.2 lb

## 2019-07-14 DIAGNOSIS — C50411 Malignant neoplasm of upper-outer quadrant of right female breast: Secondary | ICD-10-CM | POA: Diagnosis present

## 2019-07-14 DIAGNOSIS — Z79899 Other long term (current) drug therapy: Secondary | ICD-10-CM | POA: Insufficient documentation

## 2019-07-14 DIAGNOSIS — Z510 Encounter for antineoplastic radiation therapy: Secondary | ICD-10-CM | POA: Diagnosis not present

## 2019-07-14 DIAGNOSIS — I129 Hypertensive chronic kidney disease with stage 1 through stage 4 chronic kidney disease, or unspecified chronic kidney disease: Secondary | ICD-10-CM | POA: Diagnosis not present

## 2019-07-14 DIAGNOSIS — G629 Polyneuropathy, unspecified: Secondary | ICD-10-CM | POA: Insufficient documentation

## 2019-07-14 DIAGNOSIS — Z5112 Encounter for antineoplastic immunotherapy: Secondary | ICD-10-CM | POA: Diagnosis not present

## 2019-07-14 DIAGNOSIS — R739 Hyperglycemia, unspecified: Secondary | ICD-10-CM | POA: Insufficient documentation

## 2019-07-14 DIAGNOSIS — E785 Hyperlipidemia, unspecified: Secondary | ICD-10-CM | POA: Insufficient documentation

## 2019-07-14 DIAGNOSIS — N189 Chronic kidney disease, unspecified: Secondary | ICD-10-CM | POA: Insufficient documentation

## 2019-07-14 DIAGNOSIS — Z171 Estrogen receptor negative status [ER-]: Secondary | ICD-10-CM | POA: Diagnosis not present

## 2019-07-14 MED ORDER — ACETAMINOPHEN 325 MG PO TABS
ORAL_TABLET | ORAL | Status: AC
  Filled 2019-07-14: qty 2

## 2019-07-14 MED ORDER — ACETAMINOPHEN 325 MG PO TABS
650.0000 mg | ORAL_TABLET | Freq: Once | ORAL | Status: AC
  Administered 2019-07-14: 10:00:00 650 mg via ORAL

## 2019-07-14 MED ORDER — SODIUM CHLORIDE 0.9 % IV SOLN
Freq: Once | INTRAVENOUS | Status: AC
  Filled 2019-07-14: qty 250

## 2019-07-14 MED ORDER — SODIUM CHLORIDE 0.9% FLUSH
10.0000 mL | INTRAVENOUS | Status: DC | PRN
  Administered 2019-07-14: 11:00:00 10 mL
  Filled 2019-07-14: qty 10

## 2019-07-14 MED ORDER — DIPHENHYDRAMINE HCL 25 MG PO CAPS
ORAL_CAPSULE | ORAL | Status: AC
  Filled 2019-07-14: qty 2

## 2019-07-14 MED ORDER — DIPHENHYDRAMINE HCL 25 MG PO CAPS
50.0000 mg | ORAL_CAPSULE | Freq: Once | ORAL | Status: AC
  Administered 2019-07-14: 10:00:00 50 mg via ORAL

## 2019-07-14 MED ORDER — HEPARIN SOD (PORK) LOCK FLUSH 100 UNIT/ML IV SOLN
500.0000 [IU] | Freq: Once | INTRAVENOUS | Status: AC | PRN
  Administered 2019-07-14: 11:00:00 500 [IU]
  Filled 2019-07-14: qty 5

## 2019-07-14 MED ORDER — TRASTUZUMAB-ANNS CHEMO 150 MG IV SOLR
300.0000 mg | Freq: Once | INTRAVENOUS | Status: AC
  Administered 2019-07-14: 11:00:00 300 mg via INTRAVENOUS
  Filled 2019-07-14: qty 14.29

## 2019-07-14 MED ORDER — ACETAMINOPHEN 325 MG PO TABS
ORAL_TABLET | ORAL | Status: AC
  Filled 2019-07-14: qty 1

## 2019-07-14 NOTE — Patient Instructions (Signed)
[facility] Discharge Instructions for Patients Receiving Chemotherapy  Today you received the following chemotherapy agents: Trastuzumab   To help prevent nausea and vomiting after your treatment, we encourage you to take your nausea medication  as prescribed.    If you develop nausea and vomiting that is not controlled by your nausea medication, call the clinic.   BELOW ARE SYMPTOMS THAT SHOULD BE REPORTED IMMEDIATELY:  *FEVER GREATER THAN 100.5 F  *CHILLS WITH OR WITHOUT FEVER  NAUSEA AND VOMITING THAT IS NOT CONTROLLED WITH YOUR NAUSEA MEDICATION  *UNUSUAL SHORTNESS OF BREATH  *UNUSUAL BRUISING OR BLEEDING  TENDERNESS IN MOUTH AND THROAT WITH OR WITHOUT PRESENCE OF ULCERS  *URINARY PROBLEMS  *BOWEL PROBLEMS  UNUSUAL RASH Items with * indicate a potential emergency and should be followed up as soon as possible.  Feel free to call the clinic should you have any questions or concerns. The clinic phone number is [phone].  Please show the CHEMO ALERT CARD at check-in to the Emergency Department and triage nurse.   

## 2019-07-15 ENCOUNTER — Other Ambulatory Visit: Payer: Self-pay

## 2019-07-15 ENCOUNTER — Ambulatory Visit
Admission: RE | Admit: 2019-07-15 | Discharge: 2019-07-15 | Disposition: A | Discharge status: Home / Self Care | Payer: Medicare HMO | Source: Ambulatory Visit | Attending: Radiation Oncology | Admitting: Radiation Oncology

## 2019-07-15 ENCOUNTER — Encounter: Payer: Self-pay | Admitting: *Deleted

## 2019-07-15 ENCOUNTER — Encounter: Payer: Self-pay | Admitting: Radiation Oncology

## 2019-07-15 DIAGNOSIS — Z510 Encounter for antineoplastic radiation therapy: Secondary | ICD-10-CM | POA: Diagnosis not present

## 2019-07-15 DIAGNOSIS — C50411 Malignant neoplasm of upper-outer quadrant of right female breast: Secondary | ICD-10-CM | POA: Diagnosis not present

## 2019-07-19 ENCOUNTER — Other Ambulatory Visit: Payer: Medicare HMO

## 2019-07-29 NOTE — Progress Notes (Signed)
[place]   [phone] [phone]   Clinic Follow up Note   Patient Care Team: Deland Pretty, MD as PCP - General (Internal Medicine) [name], RN as Oncology Nurse Navigator [name], RN as Oncology Nurse Navigator [name], MD as Consulting Physician (General Surgery) [name], MD as Consulting Physician (Hematology) [name], MD as Attending Physician (Radiation Oncology)  Date of Service:  08/04/2019  CHIEF COMPLAINT: F/u of right breast cancer  SUMMARY OF ONCOLOGIC HISTORY: Oncology History Overview Note  Cancer Staging Malignant neoplasm of upper-outer quadrant of right female breast [facility]) Staging form: Breast, AJCC 8th Edition - Clinical stage from 12/09/2018: Stage IIA (cT2, cN0, cM0, G3, ER-, PR-, HER2+) - Signed by [name], MD on 12/15/2018 - Pathologic stage from 05/19/2019: No Stage Recommended (ypT0, pN0, cM0, GX, ER: Not Assessed, PR: Not Assessed, HER2: Not Assessed) - Signed by [name], MD on 06/01/2019    Malignant neoplasm of upper-outer quadrant of right female breast ([place])  12/07/2018 Mammogram   Mammogram 12/07/18  IMPRESSION: 1. 3 cm mass in the 11 o'clock position of the right breast, 4 cm the nipple, measuring 3 x 2 x 2 cm, highly suspicious for breast carcinoma. No enlarged or abnormal right axillary lymph nodes.   12/09/2018 Cancer Staging   Staging form: Breast, AJCC 8th Edition - Clinical stage from 12/09/2018: Stage IIA (cT2, cN0, cM0, G3, ER-, PR-, HER2+) - Signed by [name], MD on 12/15/2018   12/09/2018 Initial Biopsy   Diagnosis 12/09/18 Breast, right, needle core biopsy, 11 o'clock - INVASIVE DUCTAL CARCINOMA. - DUCTAL CARCINOMA IN SITU. - SEE COMMENT.   12/09/2018 Receptors her2   The tumor cells are POSITIVE for Her2 (3+). Estrogen Receptor: 0%, NEGATIVE Progesterone Receptor: 0%, NEGATIVE Proliferation Marker Ki67: 30%   12/15/2018 Initial Diagnosis   Malignant neoplasm of  upper-outer quadrant of right female breast ([place])   12/21/2018 Breast MRI   IMPRESSION: 1. Approximate 4 cm mass involving the UPPER RIGHT breast with extension into the UPPER INNER QUADRANT and UPPER OUTER QUADRANT, predominantly located in the [place], biopsy-proven invasive ductal carcinoma and DCIS. The mass abuts the chest wall though I cannot confirm pectoralis muscle invasion. 2. Superficial satellite mass versus pathologic intramammary lymph node in the UPPER OUTER QUADRANT of the RIGHT breast measuring approximately 1 cm, located LATERAL to the dominant mass. 3. No MRI evidence of malignancy involving the LEFT breast. 4. No pathologic lymphadenopathy otherwise.   12/24/2018 Pathology Results   Diagnosis Breast, right, needle core biopsy, upper outer quadrant, peripheral to the recently biopsy proven breast carcinoma - INVASIVE DUCTAL CARCINOMA. SEE COMMENT. Results: IMMUNOHISTOCHEMICAL AND MORPHOMETRIC ANALYSIS PERFORMED MANUALLY The tumor cells are POSITIVE for Her2 (3+). Estrogen Receptor: 0%, NEGATIVE Progesterone Receptor: 0%, NEGATIVE Proliferation Marker Ki67: 40%   01/06/2019 -  Chemotherapy   Neoadjuvant chemotherapy TCHP beginning 01/06/19 completed 5 cycle of TCHP on 03/30/19.  She started maintenance Herceptin on 05/04/19. D/c Perjeta after 05/04/19 due to low benefit.    04/15/2019 Breast MRI   IMPRESSION: 1. Interval resolution of the 2 enhancing masses corresponding with biopsy proven malignancy in the upper right breast. There is no residual or new, suspicious enhancement. 2. No MRI evidence of malignancy in the left breast.   04/15/2019 Echocardiogram   FINDINGS  Left Ventricle: Left ventricular ejection fraction, by visual estimation, is 55 to 60%. The left ventricle has normal function. There is no left ventricular hypertrophy. Normal left ventricular  size. Spectral Doppler shows Left ventricular diastolic  parameters were normal pattern of LV  diastolic filling. Compared with the echo 2/70/62, systolic function is slightly worse, especially in the anteroseptum. However overall strain is slightly better.     05/19/2019 Surgery   BREAST LUMPECTOMY WITH RADIOACTIVE SEED X2  AND SENTINEL LYMPH NODE BIOPSY by Dr. Barry Dienes  05/19/19    05/19/2019 Pathology Results   FINAL MICROSCOPIC DIAGNOSIS:   A. BREAST, RIGHT LATERAL SATELLITE LESION, LUMPECTOMY:  - No residual invasive carcinoma status post neoadjuvant treatment.  - One intraparenchymal lymph node, negative for carcinoma (0/1).  - See oncology table.   B. BREAST, RIGHT DOMINANT MASS, LUMPECTOMY:  - No residual invasive carcinoma status post neoadjuvant treatment.  - Biopsy site changes.  - See oncology table.   C. SENTINEL LYMPH NODE, RIGHT AXILLARY, EXCISION:  - One lymph node, negative for carcinoma (0/1).   D. SENTINEL LYMPH NODE, RIGHT AXILLARY, EXCISION:  - One lymph node, negative for carcinoma (0/1).   E. SENTINEL LYMPH NODE, RIGHT AXILLARY, EXCISION:  - One lymph node, negative for carcinoma (0/1).   F. SENTINEL LYMPH NODE, RIGHT AXILLARY, EXCISION:  - One lymph node, negative for carcinoma (0/1).    05/19/2019 Cancer Staging   Staging form: Breast, AJCC 8th Edition - Pathologic stage from 05/19/2019: No Stage Recommended (ypT0, pN0, cM0, GX, ER: Not Assessed, PR: Not Assessed, HER2: Not Assessed) - Signed by [name], MD on 06/01/2019   06/16/2019 - 07/15/2019 Radiation Therapy   Adjuvant Radiation with Dr. Isidore Moos  06/16/2019 through 07/15/2019      CURRENT THERAPY:  She started maintenance Herceptin on 05/04/19. Plan to continue to complete 1 year of treatment from 12/2018. D/c Perjeta after 05/04/19 due to low benefit.   INTERVAL HISTORY:  Christina Sexton is here for a follow up and treatment. She presents to the clinic alone. She notes she will proceed with first [place]  vaccination on 2/6. She was in communication with Dr. Percival Spanish in 06/2019. She notes  occasional SOB from dry cough mostly when she takes a lot. She attributes this to sinuses. She denies chest discomfort of fever. She notes she has continued Protonix after chemo. She notes she would like to continue given her prior acid reflux.  She notes $4200 was her payment for treatment in 2020. She notes paying $40 copay for doctors visits to specialists. She has concerns about bills for 2021.     REVIEW OF SYSTEMS:   Constitutional: Denies fevers, chills or abnormal weight loss Eyes: Denies blurriness of vision Ears, nose, mouth, throat, and face: Denies mucositis or sore throat Respiratory: (+) Occasional cough and SOB  Cardiovascular: Denies palpitation, chest discomfort or lower extremity swelling Gastrointestinal:  Denies nausea, heartburn or change in bowel habits Skin: Denies abnormal skin rashes Lymphatics: Denies new lymphadenopathy or easy bruising Neurological:Denies numbness, tingling or new weaknesses Behavioral/Psych: Mood is stable, no new changes  All other systems were reviewed with the patient and are negative.  MEDICAL HISTORY:  Past Medical History:  Diagnosis Date  . Allergy   . Anemia   . Asthma   . Cancer ([place])    Breast-R  . Chronic kidney disease   . History of kidney stones   . Hyperlipidemia   . Hypertension   . Kidney stone   . Mitral valve prolapse   . Osteoporosis     SURGICAL HISTORY: Past Surgical History:  Procedure Laterality Date  . BREAST BIOPSY Left 12/27/2015   benign  .  BREAST BIOPSY Right 07/26/2013   benign  . BREAST EXCISIONAL BIOPSY Right 09/08/2013   high risk   . BREAST LUMPECTOMY WITH RADIOACTIVE SEED AND SENTINEL LYMPH NODE BIOPSY Right 05/19/2019   Procedure: BREAST LUMPECTOMY WITH RADIOACTIVE SEED X2  AND SENTINEL LYMPH NODE BIOPSY;  Surgeon: [name], MD;  Location: [facility];  Service: General;  Laterality: Right;  . PORTACATH PLACEMENT N/A 12/28/2018   Procedure: INSERTION PORT-A-CATH;  Surgeon: [name], MD;   Location: [facility];  Service: General;  Laterality: N/A;  . SHOULDER SURGERY      I have reviewed the social history and family history with the patient and they are unchanged from previous note.  ALLERGIES:  has No Known Allergies.  MEDICATIONS:  Current Outpatient Medications  Medication Sig Dispense Refill  . atorvastatin (LIPITOR) 20 MG tablet Take 20 mg by mouth daily.    . pantoprazole (PROTONIX) 20 MG tablet TAKE 1 TABLET BY MOUTH EVERY DAY 30 tablet 1  . potassium chloride SA (KLOR-CON) 20 MEQ tablet Take 1 tablet (20 mEq total) by mouth every other day. 30 tablet 1   Current Facility-Administered Medications  Medication Dose Route Frequency Provider Last Rate Last Admin  . 0.9 %  sodium chloride infusion  500 mL Intravenous Once Nandigam, Venia Minks, MD        PHYSICAL EXAMINATION: ECOG PERFORMANCE STATUS: 0 - Asymptomatic  Vitals:   08/04/19 0921  BP: 118/64  Pulse: 89  Resp: 18  Temp: 97.7 F (36.5 C)  SpO2: 99%   Filed Weights   08/04/19 0921  Weight: 102 lb 8 oz (46.5 kg)    GENERAL:alert, no distress and comfortable SKIN: skin color, texture, turgor are normal, no rashes or significant lesions EYES: normal, Conjunctiva are pink and non-injected, sclera clear  NECK: supple, thyroid normal size, non-tender, without nodularity LYMPH:  no palpable lymphadenopathy in the cervical, axillary  LUNGS: clear to auscultation and percussion with normal breathing effort HEART: regular rate & rhythm and no murmurs and no lower extremity edema ABDOMEN:abdomen soft, non-tender and normal bowel sounds Musculoskeletal:no cyanosis of digits and no clubbing  NEURO: alert & oriented x 3 with fluent speech, no focal motor/sensory deficits BREAST: S/p right lumpectomy: Surgical incision healed well. (+) nearly resolved hyperpigmentation. No palpable mass, nodules or adenopathy bilaterally. Breast exam benign.   LABORATORY DATA:  I have reviewed the data as listed CBC Latest  Ref Rng & Units 08/04/2019 05/14/2019 05/04/2019  WBC 4.0 - 10.5 K/uL 3.2(L) 3.0(L) 4.2  Hemoglobin 12.0 - 15.0 g/dL 12.6 12.3 10.8(L)  Hematocrit 36.0 - 46.0 % 37.8 39.1 33.3(L)  Platelets 150 - 400 K/uL 124(L) 170 162     CMP Latest Ref Rng & Units 08/04/2019 05/14/2019 05/04/2019  Glucose 70 - 99 mg/dL 120(H) 107(H) 93  BUN 8 - 23 mg/dL '13 11 11  '$ Creatinine 0.44 - 1.00 mg/dL 0.81 0.69 0.70  Sodium 135 - 145 mmol/L 142 143 143  Potassium 3.5 - 5.1 mmol/L 3.4(L) 3.9 3.6  Chloride 98 - 111 mmol/L 107 107 106  CO2 22 - 32 mmol/L '28 27 27  '$ Calcium 8.9 - 10.3 mg/dL 9.0 9.7 9.1  Total Protein 6.5 - 8.1 g/dL 6.7 - 6.4(L)  Total Bilirubin 0.3 - 1.2 mg/dL 0.6 - 0.5  Alkaline Phos 38 - 126 U/L 57 - 55  AST 15 - 41 U/L 23 - 27  ALT 0 - 44 U/L 26 - 32      RADIOGRAPHIC STUDIES: I have personally  reviewed the radiological images as listed and agreed with the findings in the report. No results found.   ASSESSMENT & PLAN:  CIRIA BERNARDINI is a 69 y.o. 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 -She completed 5/6 cycles of neoadjuvant TCHP, she did not tolerate well.  -She underwent right lumpectomy by Dr. Barry Dienes on 05/19/19. We discussed her pathology report which shows complete response from neoadjuvant chemo, negative 5 nodes. This predicts very low risk of recurrence.  -She recently completed adjuvant radiation on 07/15/19.  -She started maintenance Herceptin every 3 weeks on 05/04/19 to continue to complete 1 year treatment. I previously discussed the option of Herceptin injection. She opted to continue with infusion. Given her negative lymph nodes at the initial diagnosis, benefits of adjuvant Perjeta is minimal, we d/c Perjeta since 05/04/19.  -She is clinically stable. She continues to tolerate treatment well. Physical exam unremarkable.  Labs reviewed, WBC 3.2, PLT 124K, K 3.4. Overall adequate to proceed with Herceptin today. Continue every 3 weeks. She has 9 more infusion remaining.  -I recommend she increase potassium in her diet.  -She is overdue for ECHO. I will send note to Dr. Percival Spanish to be done soon.  -f/u in 3 months  -She will proceed with first COVID19 vaccine on 2/6.    2. Neuropathy, weight loss, secondary to chemo -Since completing chemo she has mild residual neuropathy in her feet and fingers. I discussed this can take a while to recover. I previously suggested she use OTC B complex. If not enough I can call in Gabapentin.  -She has been able to maintain her weight now and her hair and nail growth is slowly returning.  -She has continued on Protonix after chemo. I discussed longterm use comes with more side effects. She can stop Protonix but if she wants to continues she should use it only as needed.   3. Socialand financialsupport  -She is single, lives alone and has no children. She lives 1-2 miles from cancer center.  -She notes she has friends but overall not much family support. -She is so far able to do all self-care -She has medicare and on social security as her only income.  -She notes she has been given some financial aid from [place] but still notes her concerns with future medical bills in 2021. I will have her Financial advocate reach out to her.   4. Hyperglycemia  -Secondary to steroids during neoadjuvant chemo. Should resolve after completion of chemo.  -Continue to monitor.    PLAN: -Send note to Dr. Percival Spanish for her echo  -Labs reviewed and adequate to proceed with Herceptin today  -Lab, flush, Herceptin every 3 weeks X4  -F/u in 3 months  -I reached out to our financial advocate for her    No problem-specific Assessment & Plan notes found for this encounter.   Orders Placed This Encounter  Procedures  . ECHOCARDIOGRAM COMPLETE    Standing Status:   Future    Standing  Expiration Date:   11/01/2020    Order Specific Question:   Where should this test be performed    Answer:   [facility]    Order Specific Question:   Perflutren DEFINITY (image enhancing agent) should be administered unless hypersensitivity or allergy exist    Answer:   Administer Perflutren    Order Specific Question:   Is a special reader  required? (athlete or structural heart)    Answer:   No    Order Specific Question:   Reason for exam-Echo    Answer:   Chemo  V67.2 / Z09   All questions were answered. The patient knows to call the clinic with any problems, questions or concerns. No barriers to learning was detected. The total time spent in the appointment was 30 minutes.     [name], MD 08/04/2019   I, Joslyn Devon, am acting as scribe for [name], MD.   I have reviewed the above documentation for accuracy and completeness, and I agree with the above.

## 2019-08-02 NOTE — Progress Notes (Signed)
  Patient Name: Christina Sexton MRN: [id number] DOB: March 16, 1951 Referring Physician: [name] (Profile Not Attached) Date of Service: 07/15/2019 [facility], [place]                                                        End Of Treatment Note  Diagnoses: C50.411-Malignant neoplasm of upper-outer quadrant of right female breast  Cancer Staging:  Cancer Staging Malignant neoplasm of upper-outer quadrant of right female breast [facility]) Staging form: Breast, AJCC 8th Edition - Clinical stage from 12/09/2018: Stage IIA (cT2, cN0, cM0, G3, ER-, PR-, HER2+) - Signed by [name], MD on 12/15/2018 - Pathologic stage from 05/19/2019: No Stage Recommended (ypT0, pN0, cM0, GX, ER: Not Assessed, PR: Not Assessed, HER2: Not Assessed) - Signed by [name], MD on 06/01/2019    Intent: curative  Radiation Treatment Dates: 06/16/2019 through 07/15/2019 Site Technique Total Dose (Gy) Dose per Fx (Gy) Completed Fx Beam Energies  Breast, Right: Breast_Rt 3D 40.05/40.05 2.67 15/15 6X  Breast, Right: Breast_Rt_Bst specialPort 10/10 2 5/5 9E, 12E   Narrative: The patient tolerated radiation therapy relatively well.   Plan: The patient will follow-up with radiation oncology in 1 mo, or as needed.  -----------------------------------  [name], MD

## 2019-08-04 ENCOUNTER — Inpatient Hospital Stay: Payer: Medicare HMO

## 2019-08-04 ENCOUNTER — Inpatient Hospital Stay: Payer: Medicare HMO | Attending: Hematology

## 2019-08-04 ENCOUNTER — Other Ambulatory Visit: Payer: Self-pay

## 2019-08-04 ENCOUNTER — Inpatient Hospital Stay (HOSPITAL_BASED_OUTPATIENT_CLINIC_OR_DEPARTMENT_OTHER): Payer: Medicare HMO | Admitting: Hematology

## 2019-08-04 ENCOUNTER — Encounter: Payer: Self-pay | Admitting: *Deleted

## 2019-08-04 VITALS — BP 118/64 | HR 89 | Temp 97.7°F | Resp 18 | Ht 63.0 in | Wt 102.5 lb

## 2019-08-04 DIAGNOSIS — G629 Polyneuropathy, unspecified: Secondary | ICD-10-CM | POA: Diagnosis not present

## 2019-08-04 DIAGNOSIS — C50411 Malignant neoplasm of upper-outer quadrant of right female breast: Secondary | ICD-10-CM

## 2019-08-04 DIAGNOSIS — N189 Chronic kidney disease, unspecified: Secondary | ICD-10-CM | POA: Diagnosis not present

## 2019-08-04 DIAGNOSIS — Z79899 Other long term (current) drug therapy: Secondary | ICD-10-CM | POA: Diagnosis not present

## 2019-08-04 DIAGNOSIS — Z171 Estrogen receptor negative status [ER-]: Secondary | ICD-10-CM

## 2019-08-04 DIAGNOSIS — I10 Essential (primary) hypertension: Secondary | ICD-10-CM | POA: Diagnosis not present

## 2019-08-04 DIAGNOSIS — Z9221 Personal history of antineoplastic chemotherapy: Secondary | ICD-10-CM | POA: Insufficient documentation

## 2019-08-04 DIAGNOSIS — I341 Nonrheumatic mitral (valve) prolapse: Secondary | ICD-10-CM | POA: Insufficient documentation

## 2019-08-04 DIAGNOSIS — K219 Gastro-esophageal reflux disease without esophagitis: Secondary | ICD-10-CM | POA: Diagnosis not present

## 2019-08-04 DIAGNOSIS — R739 Hyperglycemia, unspecified: Secondary | ICD-10-CM | POA: Diagnosis not present

## 2019-08-04 DIAGNOSIS — Z7952 Long term (current) use of systemic steroids: Secondary | ICD-10-CM | POA: Insufficient documentation

## 2019-08-04 DIAGNOSIS — Z87442 Personal history of urinary calculi: Secondary | ICD-10-CM | POA: Insufficient documentation

## 2019-08-04 DIAGNOSIS — Z923 Personal history of irradiation: Secondary | ICD-10-CM | POA: Insufficient documentation

## 2019-08-04 DIAGNOSIS — Z5112 Encounter for antineoplastic immunotherapy: Secondary | ICD-10-CM | POA: Diagnosis present

## 2019-08-04 DIAGNOSIS — E785 Hyperlipidemia, unspecified: Secondary | ICD-10-CM | POA: Insufficient documentation

## 2019-08-04 DIAGNOSIS — Z95828 Presence of other vascular implants and grafts: Secondary | ICD-10-CM

## 2019-08-04 LAB — CMP (CANCER CENTER ONLY)
ALT: 26 U/L (ref 0–44)
AST: 23 U/L (ref 15–41)
Albumin: 4.2 g/dL (ref 3.5–5.0)
Alkaline Phosphatase: 57 U/L (ref 38–126)
Anion gap: 7 (ref 5–15)
BUN: 13 mg/dL (ref 8–23)
CO2: 28 mmol/L (ref 22–32)
Calcium: 9 mg/dL (ref 8.9–10.3)
Chloride: 107 mmol/L (ref 98–111)
Creatinine: 0.81 mg/dL (ref 0.44–1.00)
GFR, Est AFR Am: >60 mL/min (ref >60)
GFR, Estimated: >60 mL/min (ref >60)
Glucose, Bld: 120 mg/dL — ABNORMAL HIGH (ref 70–99)
Potassium: 3.4 mmol/L — ABNORMAL LOW (ref 3.5–5.1)
Sodium: 142 mmol/L (ref 135–145)
Total Bilirubin: 0.6 mg/dL (ref 0.3–1.2)
Total Protein: 6.7 g/dL (ref 6.5–8.1)

## 2019-08-04 LAB — CBC WITH DIFFERENTIAL (CANCER CENTER ONLY)
Abs Immature Granulocytes: 0.01 10*3/uL (ref 0.00–0.07)
Basophils Absolute: 0 10*3/uL (ref 0.0–0.1)
Basophils Relative: 1 %
Eosinophils Absolute: 0.2 10*3/uL (ref 0.0–0.5)
Eosinophils Relative: 7 %
HCT: 37.8 % (ref 36.0–46.0)
Hemoglobin: 12.6 g/dL (ref 12.0–15.0)
Immature Granulocytes: 0 %
Lymphocytes Relative: 20 %
Lymphs Abs: 0.7 10*3/uL (ref 0.7–4.0)
MCH: 31 pg (ref 26.0–34.0)
MCHC: 33.3 g/dL (ref 30.0–36.0)
MCV: 93.1 fL (ref 80.0–100.0)
Monocytes Absolute: 0.4 10*3/uL (ref 0.1–1.0)
Monocytes Relative: 12 %
Neutro Abs: 1.9 10*3/uL (ref 1.7–7.7)
Neutrophils Relative %: 60 %
Platelet Count: 124 10*3/uL — ABNORMAL LOW (ref 150–400)
RBC: 4.06 MIL/uL (ref 3.87–5.11)
RDW: 12.1 % (ref 11.5–15.5)
WBC Count: 3.2 10*3/uL — ABNORMAL LOW (ref 4.0–10.5)
nRBC: 0 % (ref 0.0–0.2)

## 2019-08-04 MED ORDER — HEPARIN SOD (PORK) LOCK FLUSH 100 UNIT/ML IV SOLN
500.0000 [IU] | Freq: Once | INTRAVENOUS | Status: AC | PRN
  Administered 2019-08-04: 11:00:00 500 [IU]
  Filled 2019-08-04: qty 5

## 2019-08-04 MED ORDER — ACETAMINOPHEN 325 MG PO TABS
650.0000 mg | ORAL_TABLET | Freq: Once | ORAL | Status: AC
  Administered 2019-08-04: 10:00:00 650 mg via ORAL

## 2019-08-04 MED ORDER — SODIUM CHLORIDE 0.9% FLUSH
10.0000 mL | Freq: Once | INTRAVENOUS | Status: AC
  Administered 2019-08-04: 09:00:00 10 mL
  Filled 2019-08-04: qty 10

## 2019-08-04 MED ORDER — TRASTUZUMAB-ANNS CHEMO 150 MG IV SOLR
300.0000 mg | Freq: Once | INTRAVENOUS | Status: AC
  Administered 2019-08-04: 11:00:00 300 mg via INTRAVENOUS
  Filled 2019-08-04: qty 14.29

## 2019-08-04 MED ORDER — DIPHENHYDRAMINE HCL 25 MG PO CAPS
ORAL_CAPSULE | ORAL | Status: AC
  Filled 2019-08-04: qty 2

## 2019-08-04 MED ORDER — SODIUM CHLORIDE 0.9 % IV SOLN
Freq: Once | INTRAVENOUS | Status: AC
  Filled 2019-08-04: qty 250

## 2019-08-04 MED ORDER — SODIUM CHLORIDE 0.9% FLUSH
10.0000 mL | INTRAVENOUS | Status: DC | PRN
  Administered 2019-08-04: 11:00:00 10 mL
  Filled 2019-08-04: qty 10

## 2019-08-04 MED ORDER — DIPHENHYDRAMINE HCL 25 MG PO CAPS
50.0000 mg | ORAL_CAPSULE | Freq: Once | ORAL | Status: AC
  Administered 2019-08-04: 10:00:00 50 mg via ORAL

## 2019-08-04 MED ORDER — ACETAMINOPHEN 325 MG PO TABS
ORAL_TABLET | ORAL | Status: AC
  Filled 2019-08-04: qty 2

## 2019-08-04 NOTE — Progress Notes (Signed)
Per Dr. Burr Medico, ok to proceed with treatment using echocardiogram results from 04/2019. Will arrange for repeat echo.

## 2019-08-04 NOTE — Patient Instructions (Signed)
[facility] Discharge Instructions for Patients Receiving Chemotherapy  Today you received the following chemotherapy agents trastuzumab.  To help prevent nausea and vomiting after your treatment, we encourage you to take your nausea medication as directed.    If you develop nausea and vomiting that is not controlled by your nausea medication, call the clinic.   BELOW ARE SYMPTOMS THAT SHOULD BE REPORTED IMMEDIATELY:  *FEVER GREATER THAN 100.5 F  *CHILLS WITH OR WITHOUT FEVER  NAUSEA AND VOMITING THAT IS NOT CONTROLLED WITH YOUR NAUSEA MEDICATION  *UNUSUAL SHORTNESS OF BREATH  *UNUSUAL BRUISING OR BLEEDING  TENDERNESS IN MOUTH AND THROAT WITH OR WITHOUT PRESENCE OF ULCERS  *URINARY PROBLEMS  *BOWEL PROBLEMS  UNUSUAL RASH Items with * indicate a potential emergency and should be followed up as soon as possible.  Feel free to call the clinic should you have any questions or concerns. The clinic phone number is [phone].  Please show the CHEMO ALERT CARD at check-in to the Emergency Department and triage nurse.   

## 2019-08-05 ENCOUNTER — Encounter: Payer: Self-pay | Admitting: *Deleted

## 2019-08-05 ENCOUNTER — Encounter: Payer: Self-pay | Admitting: Hematology

## 2019-08-05 ENCOUNTER — Other Ambulatory Visit: Payer: Self-pay

## 2019-08-05 ENCOUNTER — Telehealth: Payer: Self-pay | Admitting: Hematology

## 2019-08-05 DIAGNOSIS — C50411 Malignant neoplasm of upper-outer quadrant of right female breast: Secondary | ICD-10-CM

## 2019-08-05 DIAGNOSIS — Z79899 Other long term (current) drug therapy: Secondary | ICD-10-CM

## 2019-08-05 NOTE — Telephone Encounter (Signed)
Scheduled appt per 2/3 los.  Left a vm of the appt date and time.

## 2019-08-05 NOTE — Progress Notes (Signed)
Called patient to follow up on concern regarding financial assistance for treatment. Discussed qualifications for [facility] financial assistance and advised her to contact billing to discuss arrangements. There is currently no copay assistance for her diagnosis/treatment available to her. She has already utilized outside foundation assistance which is one time.  She has my card for any additional financial questions or concerns.

## 2019-08-18 ENCOUNTER — Other Ambulatory Visit: Payer: Self-pay

## 2019-08-18 ENCOUNTER — Encounter: Payer: Self-pay | Admitting: Radiation Oncology

## 2019-08-18 ENCOUNTER — Ambulatory Visit (HOSPITAL_COMMUNITY)
Admission: RE | Admit: 2019-08-18 | Discharge: 2019-08-18 | Disposition: A | Discharge status: Home / Self Care | Payer: Medicare HMO | Source: Ambulatory Visit | Attending: Hematology | Admitting: Hematology

## 2019-08-18 DIAGNOSIS — Z01818 Encounter for other preprocedural examination: Secondary | ICD-10-CM | POA: Insufficient documentation

## 2019-08-18 DIAGNOSIS — N189 Chronic kidney disease, unspecified: Secondary | ICD-10-CM | POA: Insufficient documentation

## 2019-08-18 DIAGNOSIS — I129 Hypertensive chronic kidney disease with stage 1 through stage 4 chronic kidney disease, or unspecified chronic kidney disease: Secondary | ICD-10-CM | POA: Insufficient documentation

## 2019-08-18 DIAGNOSIS — C50411 Malignant neoplasm of upper-outer quadrant of right female breast: Secondary | ICD-10-CM | POA: Insufficient documentation

## 2019-08-18 DIAGNOSIS — E785 Hyperlipidemia, unspecified: Secondary | ICD-10-CM | POA: Diagnosis not present

## 2019-08-18 DIAGNOSIS — Z171 Estrogen receptor negative status [ER-]: Secondary | ICD-10-CM

## 2019-08-18 NOTE — Progress Notes (Signed)
  Echocardiogram 2D Echocardiogram has been performed.  Christina Sexton 08/18/2019, 11:17 AM

## 2019-08-18 NOTE — Progress Notes (Signed)
I called the patient today about her upcoming follow-up appointment in radiation oncology.   Given the state of the COVID-19 pandemic, concerning case numbers in our community, and guidance from [facility], I offered a phone assessment with the patient to determine if coming to the clinic was necessary. She accepted.  I let the patient know that I had spoken with Dr. Isidore Moos, and she wanted them to know the importance of washing their hands for at least 20 seconds at a time, especially after going out in public, and before they eat.  Limit going out in public whenever possible. Do not touch your face, unless your hands are clean, such as when bathing. Get plenty of rest, eat well, and stay hydrated.   The patient denies any symptomatic concerns.  Specifically, they report good healing of their skin in the radiation fields.  Skin is intact.    I recommended that she continue skin care by applying oil or lotion with vitamin E to the skin in the radiation fields, BID, for 2 more months.  Continue follow-up with medical oncology - follow-up is scheduled on 08/25/19 for Transtuzumab infusion and on 10/27/19 with Dr. Burr Medico.  I explained that yearly mammograms are important for patients with intact breast tissue, and physical exams are important after mastectomy for patients that cannot undergo mammography.  I encouraged her to call if she had further questions or concerns about her healing. Otherwise, she will follow-up PRN in radiation oncology. Patient is pleased with this plan, and we will cancel her upcoming follow-up to reduce the risk of COVID-19 transmission.

## 2019-08-19 ENCOUNTER — Encounter: Payer: Self-pay | Admitting: Hematology

## 2019-08-20 ENCOUNTER — Ambulatory Visit
Admission: RE | Admit: 2019-08-20 | Discharge: 2019-08-20 | Disposition: A | Discharge status: Home / Self Care | Payer: Medicare HMO | Source: Ambulatory Visit | Attending: Radiation Oncology | Admitting: Radiation Oncology

## 2019-08-20 HISTORY — DX: Personal history of irradiation: Z92.3

## 2019-08-25 ENCOUNTER — Inpatient Hospital Stay: Payer: Medicare HMO

## 2019-08-25 ENCOUNTER — Other Ambulatory Visit: Payer: Self-pay

## 2019-08-25 ENCOUNTER — Other Ambulatory Visit: Payer: Self-pay | Admitting: Hematology

## 2019-08-25 VITALS — BP 136/81 | HR 90 | Temp 97.8°F | Resp 18 | Wt 99.5 lb

## 2019-08-25 DIAGNOSIS — K219 Gastro-esophageal reflux disease without esophagitis: Secondary | ICD-10-CM | POA: Diagnosis not present

## 2019-08-25 DIAGNOSIS — C50411 Malignant neoplasm of upper-outer quadrant of right female breast: Secondary | ICD-10-CM | POA: Diagnosis not present

## 2019-08-25 DIAGNOSIS — Z5112 Encounter for antineoplastic immunotherapy: Secondary | ICD-10-CM | POA: Diagnosis not present

## 2019-08-25 DIAGNOSIS — Z171 Estrogen receptor negative status [ER-]: Secondary | ICD-10-CM | POA: Diagnosis not present

## 2019-08-25 DIAGNOSIS — R739 Hyperglycemia, unspecified: Secondary | ICD-10-CM | POA: Diagnosis not present

## 2019-08-25 DIAGNOSIS — Z9221 Personal history of antineoplastic chemotherapy: Secondary | ICD-10-CM | POA: Diagnosis not present

## 2019-08-25 DIAGNOSIS — E785 Hyperlipidemia, unspecified: Secondary | ICD-10-CM | POA: Diagnosis not present

## 2019-08-25 DIAGNOSIS — Z923 Personal history of irradiation: Secondary | ICD-10-CM | POA: Diagnosis not present

## 2019-08-25 DIAGNOSIS — G629 Polyneuropathy, unspecified: Secondary | ICD-10-CM | POA: Diagnosis not present

## 2019-08-25 DIAGNOSIS — I10 Essential (primary) hypertension: Secondary | ICD-10-CM | POA: Diagnosis not present

## 2019-08-25 MED ORDER — ACETAMINOPHEN 325 MG PO TABS
650.0000 mg | ORAL_TABLET | Freq: Once | ORAL | Status: AC
  Administered 2019-08-25: 650 mg via ORAL

## 2019-08-25 MED ORDER — ACETAMINOPHEN 325 MG PO TABS
ORAL_TABLET | ORAL | Status: AC
  Filled 2019-08-25: qty 2

## 2019-08-25 MED ORDER — HEPARIN SOD (PORK) LOCK FLUSH 100 UNIT/ML IV SOLN
500.0000 [IU] | Freq: Once | INTRAVENOUS | Status: AC | PRN
  Administered 2019-08-25: 500 [IU]
  Filled 2019-08-25: qty 5

## 2019-08-25 MED ORDER — DIPHENHYDRAMINE HCL 25 MG PO CAPS
50.0000 mg | ORAL_CAPSULE | Freq: Once | ORAL | Status: AC
  Administered 2019-08-25: 50 mg via ORAL

## 2019-08-25 MED ORDER — TRASTUZUMAB-ANNS CHEMO 150 MG IV SOLR
300.0000 mg | Freq: Once | INTRAVENOUS | Status: AC
  Administered 2019-08-25: 300 mg via INTRAVENOUS
  Filled 2019-08-25: qty 14.29

## 2019-08-25 MED ORDER — SODIUM CHLORIDE 0.9 % IV SOLN
Freq: Once | INTRAVENOUS | Status: AC
  Filled 2019-08-25: qty 250

## 2019-08-25 MED ORDER — DIPHENHYDRAMINE HCL 25 MG PO CAPS
ORAL_CAPSULE | ORAL | Status: AC
  Filled 2019-08-25: qty 2

## 2019-08-25 MED ORDER — SODIUM CHLORIDE 0.9% FLUSH
10.0000 mL | INTRAVENOUS | Status: DC | PRN
  Administered 2019-08-25: 10 mL
  Filled 2019-08-25: qty 10

## 2019-08-25 NOTE — Patient Instructions (Signed)
[facility] Discharge Instructions for Patients Receiving Chemotherapy  Today you received the following chemotherapy agents trastuzumab.  To help prevent nausea and vomiting after your treatment, we encourage you to take your nausea medication as directed.    If you develop nausea and vomiting that is not controlled by your nausea medication, call the clinic.   BELOW ARE SYMPTOMS THAT SHOULD BE REPORTED IMMEDIATELY:  *FEVER GREATER THAN 100.5 F  *CHILLS WITH OR WITHOUT FEVER  NAUSEA AND VOMITING THAT IS NOT CONTROLLED WITH YOUR NAUSEA MEDICATION  *UNUSUAL SHORTNESS OF BREATH  *UNUSUAL BRUISING OR BLEEDING  TENDERNESS IN MOUTH AND THROAT WITH OR WITHOUT PRESENCE OF ULCERS  *URINARY PROBLEMS  *BOWEL PROBLEMS  UNUSUAL RASH Items with * indicate a potential emergency and should be followed up as soon as possible.  Feel free to call the clinic should you have any questions or concerns. The clinic phone number is [phone].  Please show the CHEMO ALERT CARD at check-in to the Emergency Department and triage nurse.   

## 2019-08-29 ENCOUNTER — Other Ambulatory Visit: Payer: Self-pay | Admitting: Hematology

## 2019-09-15 ENCOUNTER — Inpatient Hospital Stay: Payer: Medicare HMO | Attending: Hematology

## 2019-09-15 ENCOUNTER — Inpatient Hospital Stay: Payer: Medicare HMO

## 2019-09-15 ENCOUNTER — Other Ambulatory Visit: Payer: Self-pay

## 2019-09-15 VITALS — BP 127/72 | HR 80 | Temp 97.8°F | Resp 17 | Ht 63.0 in | Wt 98.8 lb

## 2019-09-15 DIAGNOSIS — Z79899 Other long term (current) drug therapy: Secondary | ICD-10-CM | POA: Diagnosis not present

## 2019-09-15 DIAGNOSIS — Z5112 Encounter for antineoplastic immunotherapy: Secondary | ICD-10-CM | POA: Diagnosis not present

## 2019-09-15 DIAGNOSIS — C50411 Malignant neoplasm of upper-outer quadrant of right female breast: Secondary | ICD-10-CM | POA: Diagnosis present

## 2019-09-15 DIAGNOSIS — Z9221 Personal history of antineoplastic chemotherapy: Secondary | ICD-10-CM | POA: Diagnosis not present

## 2019-09-15 DIAGNOSIS — Z171 Estrogen receptor negative status [ER-]: Secondary | ICD-10-CM | POA: Diagnosis not present

## 2019-09-15 DIAGNOSIS — Z923 Personal history of irradiation: Secondary | ICD-10-CM | POA: Insufficient documentation

## 2019-09-15 LAB — CMP (CANCER CENTER ONLY)
ALT: 19 U/L (ref 0–44)
AST: 21 U/L (ref 15–41)
Albumin: 4.4 g/dL (ref 3.5–5.0)
Alkaline Phosphatase: 62 U/L (ref 38–126)
Anion gap: 10 (ref 5–15)
BUN: 14 mg/dL (ref 8–23)
CO2: 28 mmol/L (ref 22–32)
Calcium: 9.2 mg/dL (ref 8.9–10.3)
Chloride: 104 mmol/L (ref 98–111)
Creatinine: 0.93 mg/dL (ref 0.44–1.00)
GFR, Est AFR Am: >60 mL/min (ref >60)
GFR, Estimated: >60 mL/min (ref >60)
Glucose, Bld: 103 mg/dL — ABNORMAL HIGH (ref 70–99)
Potassium: 3.6 mmol/L (ref 3.5–5.1)
Sodium: 142 mmol/L (ref 135–145)
Total Bilirubin: 1 mg/dL (ref 0.3–1.2)
Total Protein: 7.2 g/dL (ref 6.5–8.1)

## 2019-09-15 LAB — CBC WITH DIFFERENTIAL (CANCER CENTER ONLY)
Abs Immature Granulocytes: 0.01 10*3/uL (ref 0.00–0.07)
Basophils Absolute: 0 10*3/uL (ref 0.0–0.1)
Basophils Relative: 1 %
Eosinophils Absolute: 0.2 10*3/uL (ref 0.0–0.5)
Eosinophils Relative: 6 %
HCT: 40.8 % (ref 36.0–46.0)
Hemoglobin: 13.5 g/dL (ref 12.0–15.0)
Immature Granulocytes: 0 %
Lymphocytes Relative: 23 %
Lymphs Abs: 0.8 10*3/uL (ref 0.7–4.0)
MCH: 31.3 pg (ref 26.0–34.0)
MCHC: 33.1 g/dL (ref 30.0–36.0)
MCV: 94.4 fL (ref 80.0–100.0)
Monocytes Absolute: 0.4 10*3/uL (ref 0.1–1.0)
Monocytes Relative: 11 %
Neutro Abs: 1.9 10*3/uL (ref 1.7–7.7)
Neutrophils Relative %: 59 %
Platelet Count: 161 10*3/uL (ref 150–400)
RBC: 4.32 MIL/uL (ref 3.87–5.11)
RDW: 13.1 % (ref 11.5–15.5)
WBC Count: 3.3 10*3/uL — ABNORMAL LOW (ref 4.0–10.5)
nRBC: 0 % (ref 0.0–0.2)

## 2019-09-15 MED ORDER — ACETAMINOPHEN 325 MG PO TABS
ORAL_TABLET | ORAL | Status: AC
  Filled 2019-09-15: qty 2

## 2019-09-15 MED ORDER — ACETAMINOPHEN 325 MG PO TABS
650.0000 mg | ORAL_TABLET | Freq: Once | ORAL | Status: AC
  Administered 2019-09-15: 650 mg via ORAL

## 2019-09-15 MED ORDER — SODIUM CHLORIDE 0.9% FLUSH
10.0000 mL | INTRAVENOUS | Status: DC | PRN
  Administered 2019-09-15: 10 mL
  Filled 2019-09-15: qty 10

## 2019-09-15 MED ORDER — DIPHENHYDRAMINE HCL 25 MG PO CAPS
ORAL_CAPSULE | ORAL | Status: AC
  Filled 2019-09-15: qty 2

## 2019-09-15 MED ORDER — HEPARIN SOD (PORK) LOCK FLUSH 100 UNIT/ML IV SOLN
500.0000 [IU] | Freq: Once | INTRAVENOUS | Status: AC | PRN
  Administered 2019-09-15: 500 [IU]
  Filled 2019-09-15: qty 5

## 2019-09-15 MED ORDER — DIPHENHYDRAMINE HCL 25 MG PO CAPS
50.0000 mg | ORAL_CAPSULE | Freq: Once | ORAL | Status: AC
  Administered 2019-09-15: 50 mg via ORAL

## 2019-09-15 MED ORDER — TRASTUZUMAB-ANNS CHEMO 150 MG IV SOLR
300.0000 mg | Freq: Once | INTRAVENOUS | Status: AC
  Administered 2019-09-15: 300 mg via INTRAVENOUS
  Filled 2019-09-15: qty 14.29

## 2019-09-15 MED ORDER — SODIUM CHLORIDE 0.9 % IV SOLN
Freq: Once | INTRAVENOUS | Status: AC
  Filled 2019-09-15: qty 250

## 2019-09-15 NOTE — Patient Instructions (Signed)
[facility] Discharge Instructions for Patients Receiving Chemotherapy  Today you received the following chemotherapy agents trastuzumab.  To help prevent nausea and vomiting after your treatment, we encourage you to take your nausea medication as directed.    If you develop nausea and vomiting that is not controlled by your nausea medication, call the clinic.   BELOW ARE SYMPTOMS THAT SHOULD BE REPORTED IMMEDIATELY:  *FEVER GREATER THAN 100.5 F  *CHILLS WITH OR WITHOUT FEVER  NAUSEA AND VOMITING THAT IS NOT CONTROLLED WITH YOUR NAUSEA MEDICATION  *UNUSUAL SHORTNESS OF BREATH  *UNUSUAL BRUISING OR BLEEDING  TENDERNESS IN MOUTH AND THROAT WITH OR WITHOUT PRESENCE OF ULCERS  *URINARY PROBLEMS  *BOWEL PROBLEMS  UNUSUAL RASH Items with * indicate a potential emergency and should be followed up as soon as possible.  Feel free to call the clinic should you have any questions or concerns. The clinic phone number is [phone].  Please show the CHEMO ALERT CARD at check-in to the Emergency Department and triage nurse.   

## 2019-09-30 NOTE — Progress Notes (Signed)
Pharmacist Chemotherapy Monitoring - Follow Up Assessment    I verify that I have reviewed each item in the below checklist:  . Regimen for the patient is scheduled for the appropriate day and plan matches scheduled date. Marland Kitchen Appropriate non-routine labs are ordered dependent on drug ordered. . If applicable, additional medications reviewed and ordered per protocol based on lifetime cumulative doses and/or treatment regimen.   Plan for follow-up and/or issues identified: No . I-vent associated with next due treatment: No . MD and/or nursing notified: No  Delane Ginger 09/30/2019 7:49 AM

## 2019-10-06 ENCOUNTER — Other Ambulatory Visit: Payer: Self-pay

## 2019-10-06 ENCOUNTER — Inpatient Hospital Stay: Payer: Medicare HMO | Attending: Hematology

## 2019-10-06 VITALS — BP 141/94 | HR 95 | Temp 99.1°F | Resp 18 | Ht 63.0 in | Wt 99.0 lb

## 2019-10-06 DIAGNOSIS — Z923 Personal history of irradiation: Secondary | ICD-10-CM | POA: Insufficient documentation

## 2019-10-06 DIAGNOSIS — C50411 Malignant neoplasm of upper-outer quadrant of right female breast: Secondary | ICD-10-CM | POA: Insufficient documentation

## 2019-10-06 DIAGNOSIS — Z5112 Encounter for antineoplastic immunotherapy: Secondary | ICD-10-CM | POA: Diagnosis not present

## 2019-10-06 DIAGNOSIS — Z79899 Other long term (current) drug therapy: Secondary | ICD-10-CM | POA: Insufficient documentation

## 2019-10-06 DIAGNOSIS — Z9221 Personal history of antineoplastic chemotherapy: Secondary | ICD-10-CM | POA: Diagnosis not present

## 2019-10-06 DIAGNOSIS — Z170 Estrogen receptor positive status [ER+]: Secondary | ICD-10-CM | POA: Diagnosis not present

## 2019-10-06 DIAGNOSIS — Z171 Estrogen receptor negative status [ER-]: Secondary | ICD-10-CM

## 2019-10-06 MED ORDER — DIPHENHYDRAMINE HCL 25 MG PO CAPS
ORAL_CAPSULE | ORAL | Status: AC
  Filled 2019-10-06: qty 2

## 2019-10-06 MED ORDER — ACETAMINOPHEN 325 MG PO TABS
650.0000 mg | ORAL_TABLET | Freq: Once | ORAL | Status: AC
  Administered 2019-10-06: 11:00:00 650 mg via ORAL

## 2019-10-06 MED ORDER — SODIUM CHLORIDE 0.9 % IV SOLN
Freq: Once | INTRAVENOUS | Status: AC
  Filled 2019-10-06: qty 250

## 2019-10-06 MED ORDER — HEPARIN SOD (PORK) LOCK FLUSH 100 UNIT/ML IV SOLN
500.0000 [IU] | Freq: Once | INTRAVENOUS | Status: AC | PRN
  Administered 2019-10-06: 12:00:00 500 [IU]
  Filled 2019-10-06: qty 5

## 2019-10-06 MED ORDER — DIPHENHYDRAMINE HCL 25 MG PO CAPS
50.0000 mg | ORAL_CAPSULE | Freq: Once | ORAL | Status: AC
  Administered 2019-10-06: 50 mg via ORAL

## 2019-10-06 MED ORDER — ACETAMINOPHEN 325 MG PO TABS
ORAL_TABLET | ORAL | Status: AC
  Filled 2019-10-06: qty 2

## 2019-10-06 MED ORDER — TRASTUZUMAB-ANNS CHEMO 150 MG IV SOLR
300.0000 mg | Freq: Once | INTRAVENOUS | Status: AC
  Administered 2019-10-06: 300 mg via INTRAVENOUS
  Filled 2019-10-06: qty 14.29

## 2019-10-06 MED ORDER — SODIUM CHLORIDE 0.9% FLUSH
10.0000 mL | INTRAVENOUS | Status: DC | PRN
  Administered 2019-10-06: 10 mL
  Filled 2019-10-06: qty 10

## 2019-10-06 NOTE — Patient Instructions (Signed)
[facility] °Discharge Instructions for Patients Receiving Chemotherapy ° °Today you received the following chemotherapy agents Trastuzumab ° °To help prevent nausea and vomiting after your treatment, we encourage you to take your nausea medication as directed. °  °If you develop nausea and vomiting that is not controlled by your nausea medication, call the clinic.  ° °BELOW ARE SYMPTOMS THAT SHOULD BE REPORTED IMMEDIATELY: °· *FEVER GREATER THAN 100.5 F °· *CHILLS WITH OR WITHOUT FEVER °· NAUSEA AND VOMITING THAT IS NOT CONTROLLED WITH YOUR NAUSEA MEDICATION °· *UNUSUAL SHORTNESS OF BREATH °· *UNUSUAL BRUISING OR BLEEDING °· TENDERNESS IN MOUTH AND THROAT WITH OR WITHOUT PRESENCE OF ULCERS °· *URINARY PROBLEMS °· *BOWEL PROBLEMS °· UNUSUAL RASH °Items with * indicate a potential emergency and should be followed up as soon as possible. ° °Feel free to call the clinic should you have any questions or concerns. The clinic phone number is [phone]. ° °Please show the CHEMO ALERT CARD at check-in to the Emergency Department and triage nurse. ° ° °

## 2019-10-21 NOTE — Progress Notes (Signed)
Pharmacist Chemotherapy Monitoring - Follow Up Assessment    I verify that I have reviewed each item in the below checklist:  . Regimen for the patient is scheduled for the appropriate day and plan matches scheduled date. Marland Kitchen Appropriate non-routine labs are ordered dependent on drug ordered. . If applicable, additional medications reviewed and ordered per protocol based on lifetime cumulative doses and/or treatment regimen.   Plan for follow-up and/or issues identified: No . I-vent associated with next due treatment: No . MD and/or nursing notified: No  Philomena Course 10/21/2019 3:19 PM

## 2019-10-23 ENCOUNTER — Other Ambulatory Visit: Payer: Self-pay | Admitting: Hematology

## 2019-10-25 NOTE — Progress Notes (Signed)
[facility]   [phone] [phone]   Clinic Follow up Note   Patient Care Team: Deland Pretty, MD as PCP - General (Internal Medicine) [name], RN as Oncology Nurse Navigator [name], RN as Oncology Nurse Navigator [name], MD as Consulting Physician (General Surgery) [name], MD as Consulting Physician (Hematology) [name], MD as Attending Physician (Radiation Oncology)  Date of Service:  10/27/2019  CHIEF COMPLAINT: F/u of right breast cancer  SUMMARY OF ONCOLOGIC HISTORY: Oncology History Overview Note  Cancer Staging Malignant neoplasm of upper-outer quadrant of right female breast [facility]) Staging form: Breast, AJCC 8th Edition - Clinical stage from 12/09/2018: Stage IIA (cT2, cN0, cM0, G3, ER-, PR-, HER2+) - Signed by [name], MD on 12/15/2018 - Pathologic stage from 05/19/2019: No Stage Recommended (ypT0, pN0, cM0, GX, ER: Not Assessed, PR: Not Assessed, HER2: Not Assessed) - Signed by [name], MD on 06/01/2019    Malignant neoplasm of upper-outer quadrant of right female breast ([place])  12/07/2018 Mammogram   Mammogram 12/07/18  IMPRESSION: 1. 3 cm mass in the 11 o'clock position of the right breast, 4 cm the nipple, measuring 3 x 2 x 2 cm, highly suspicious for breast carcinoma. No enlarged or abnormal right axillary lymph nodes.   12/09/2018 Cancer Staging   Staging form: Breast, AJCC 8th Edition - Clinical stage from 12/09/2018: Stage IIA (cT2, cN0, cM0, G3, ER-, PR-, HER2+) - Signed by [name], MD on 12/15/2018   12/09/2018 Initial Biopsy   Diagnosis 12/09/18 Breast, right, needle core biopsy, 11 o'clock - INVASIVE DUCTAL CARCINOMA. - DUCTAL CARCINOMA IN SITU. - SEE COMMENT.   12/09/2018 Receptors her2   The tumor cells are POSITIVE for Her2 (3+). Estrogen Receptor: 0%, NEGATIVE Progesterone Receptor: 0%, NEGATIVE Proliferation Marker Ki67: 30%   12/15/2018 Initial Diagnosis   Malignant neoplasm of  upper-outer quadrant of right female breast ([place])   12/21/2018 Breast MRI   IMPRESSION: 1. Approximate 4 cm mass involving the UPPER RIGHT breast with extension into the UPPER INNER QUADRANT and UPPER OUTER QUADRANT, predominantly located in the [place], biopsy-proven invasive ductal carcinoma and DCIS. The mass abuts the chest wall though I cannot confirm pectoralis muscle invasion. 2. Superficial satellite mass versus pathologic intramammary lymph node in the UPPER OUTER QUADRANT of the RIGHT breast measuring approximately 1 cm, located LATERAL to the dominant mass. 3. No MRI evidence of malignancy involving the LEFT breast. 4. No pathologic lymphadenopathy otherwise.   12/24/2018 Pathology Results   Diagnosis Breast, right, needle core biopsy, upper outer quadrant, peripheral to the recently biopsy proven breast carcinoma - INVASIVE DUCTAL CARCINOMA. SEE COMMENT. Results: IMMUNOHISTOCHEMICAL AND MORPHOMETRIC ANALYSIS PERFORMED MANUALLY The tumor cells are POSITIVE for Her2 (3+). Estrogen Receptor: 0%, NEGATIVE Progesterone Receptor: 0%, NEGATIVE Proliferation Marker Ki67: 40%   01/06/2019 - 10/25/2019 Chemotherapy   Neoadjuvant chemotherapy TCHP beginning 01/06/19 completed 5 cycle of TCHP on 03/30/19.  She started maintenance Herceptin on 05/04/19. D/c Perjeta after 05/04/19 due to low benefit. Stopped Herceptin after 10/25/19 due to patient request.    04/15/2019 Breast MRI   IMPRESSION: 1. Interval resolution of the 2 enhancing masses corresponding with biopsy proven malignancy in the upper right breast. There is no residual or new, suspicious enhancement. 2. No MRI evidence of malignancy in the left breast.   04/15/2019 Echocardiogram   FINDINGS  Left Ventricle: Left ventricular ejection fraction, by visual estimation, is 55 to 60%. The left ventricle has normal function. There  is no left ventricular hypertrophy. Normal left ventricular size. Spectral Doppler shows Left  ventricular diastolic  parameters were normal pattern of LV diastolic filling. Compared with the echo 6/50/35, systolic function is slightly worse, especially in the anteroseptum. However overall strain is slightly better.     05/19/2019 Surgery   BREAST LUMPECTOMY WITH RADIOACTIVE SEED X2  AND SENTINEL LYMPH NODE BIOPSY by Dr. Barry Dienes  05/19/19    05/19/2019 Pathology Results   FINAL MICROSCOPIC DIAGNOSIS:   A. BREAST, RIGHT LATERAL SATELLITE LESION, LUMPECTOMY:  - No residual invasive carcinoma status post neoadjuvant treatment.  - One intraparenchymal lymph node, negative for carcinoma (0/1).  - See oncology table.   B. BREAST, RIGHT DOMINANT MASS, LUMPECTOMY:  - No residual invasive carcinoma status post neoadjuvant treatment.  - Biopsy site changes.  - See oncology table.   C. SENTINEL LYMPH NODE, RIGHT AXILLARY, EXCISION:  - One lymph node, negative for carcinoma (0/1).   D. SENTINEL LYMPH NODE, RIGHT AXILLARY, EXCISION:  - One lymph node, negative for carcinoma (0/1).   E. SENTINEL LYMPH NODE, RIGHT AXILLARY, EXCISION:  - One lymph node, negative for carcinoma (0/1).   F. SENTINEL LYMPH NODE, RIGHT AXILLARY, EXCISION:  - One lymph node, negative for carcinoma (0/1).    05/19/2019 Cancer Staging   Staging form: Breast, AJCC 8th Edition - Pathologic stage from 05/19/2019: No Stage Recommended (ypT0, pN0, cM0, GX, ER: Not Assessed, PR: Not Assessed, HER2: Not Assessed) - Signed by [name], MD on 06/01/2019   06/16/2019 - 07/15/2019 Radiation Therapy   Adjuvant Radiation with Dr. Isidore Moos  06/16/2019 through 07/15/2019      CURRENT THERAPY:  She started maintenance Herceptin on 05/04/19.Plan to continue to complete 1 year of treatment from 12/2018.D/c Perjeta after 05/04/19 due to low benefit. Stopped 10/25/19 due to patient request.    INTERVAL HISTORY:  Christina Sexton is here for a follow up and treatment. She presents to the clinic alone. She notes left chest wall  pain form surgery which is manageable without medication. She notes skin rash of left thigh which she attributes to possible poison oak. She was recently working outside and wore long pants. She notes she did take benadryl.  She notes she pays [facility] bills at least $500. She notes she has spoken to billing and financial advocate. She notes she is considering stopping treatment after today. She notes her energy level is fair and tries to walk as much as she can. She notes she is open to volunteer driving other patients as needed.    REVIEW OF SYSTEMS:   Constitutional: Denies fevers, chills or abnormal weight loss Eyes: Denies blurriness of vision Ears, nose, mouth, throat, and face: Denies mucositis or sore throat Respiratory: Denies cough, dyspnea or wheezes Cardiovascular: Denies palpitation, chest discomfort or lower extremity swelling Gastrointestinal:  Denies nausea, heartburn or change in bowel habits Skin: (+) Skin rash of left thigh and abdomen Lymphatics: Denies new lymphadenopathy or easy bruising Neurological:Denies numbness, tingling or new weaknesses Behavioral/Psych: Mood is stable, no new changes  All other systems were reviewed with the patient and are negative.  MEDICAL HISTORY:  Past Medical History:  Diagnosis Date   Allergy    Anemia    Asthma    Cancer ([place])    Breast-R   Chronic kidney disease    History of kidney stones    History of radiation therapy 06/16/19- 07/15/19   Right Breast 15 fractions of 2.67 Gy to total 40.05 Gy. Right Breast  boost 5 fractions of 2 Gy to total 10 Gy   Hyperlipidemia    Hypertension    Kidney stone    Mitral valve prolapse    Osteoporosis     SURGICAL HISTORY: Past Surgical History:  Procedure Laterality Date   BREAST BIOPSY Left 12/27/2015   benign   BREAST BIOPSY Right 07/26/2013   benign   BREAST EXCISIONAL BIOPSY Right 09/08/2013   high risk    BREAST LUMPECTOMY WITH RADIOACTIVE SEED AND SENTINEL LYMPH  NODE BIOPSY Right 05/19/2019   Procedure: BREAST LUMPECTOMY WITH RADIOACTIVE SEED X2  AND SENTINEL LYMPH NODE BIOPSY;  Surgeon: [name], MD;  Location: [facility];  Service: General;  Laterality: Right;   PORTACATH PLACEMENT N/A 12/28/2018   Procedure: INSERTION PORT-A-CATH;  Surgeon: [name], MD;  Location[facility];  Service: General;  Laterality: N/A;   SHOULDER SURGERY      I have reviewed the social history and family history with the patient and they are unchanged from previous note.  ALLERGIES:  has No Known Allergies.  MEDICATIONS:  Current Outpatient Medications  Medication Sig Dispense Refill   atorvastatin (LIPITOR) 20 MG tablet Take 20 mg by mouth daily.     pantoprazole (PROTONIX) 20 MG tablet TAKE 1 TABLET BY MOUTH EVERY DAY 30 tablet 1   potassium chloride SA (KLOR-CON) 20 MEQ tablet Take 1 tablet (20 mEq total) by mouth every other day. 30 tablet 1   Current Facility-Administered Medications  Medication Dose Route Frequency Provider Last Rate Last Admin   0.9 %  sodium chloride infusion  500 mL Intravenous Once Nandigam, Venia Minks, MD        PHYSICAL EXAMINATION: ECOG PERFORMANCE STATUS: 0 - Asymptomatic  Vitals:   10/27/19 1009  BP: 123/86  Pulse: 74  Resp: 18  Temp: 98.9 F (37.2 C)  SpO2: 100%   Filed Weights   10/27/19 1009  Weight: 100 lb 14.4 oz (45.8 kg)    GENERAL:alert, no distress and comfortable SKIN: skin color, texture, turgor are normal (+) Skin rash of left thigh and abdomen EYES: normal, Conjunctiva are pink and non-injected, sclera clear  NECK: supple, thyroid normal size, non-tender, without nodularity LYMPH:  no palpable lymphadenopathy in the cervical, axillary  LUNGS: clear to auscultation and percussion with normal breathing effort HEART: regular rate & rhythm and no murmurs and no lower extremity edema ABDOMEN:abdomen soft, non-tender and normal bowel sounds Musculoskeletal:no cyanosis of digits and no clubbing  NEURO:  alert & oriented x 3 with fluent speech, no focal motor/sensory deficits BREAST: S/p right lumpectomy: Surgical incision healed well with scar tissue. (+) benign skin lesion of left breast. No palpable mass, nodules or adenopathy bilaterally. Breast exam benign.   LABORATORY DATA:  I have reviewed the data as listed CBC Latest Ref Rng & Units 10/27/2019 09/15/2019 08/04/2019  WBC 4.0 - 10.5 K/uL 3.1(L) 3.3(L) 3.2(L)  Hemoglobin 12.0 - 15.0 g/dL 12.2 13.5 12.6  Hematocrit 36.0 - 46.0 % 38.0 40.8 37.8  Platelets 150 - 400 K/uL 147(L) 161 124(L)     CMP Latest Ref Rng & Units 10/27/2019 09/15/2019 08/04/2019  Glucose 70 - 99 mg/dL 94 103(H) 120(H)  BUN 8 - 23 mg/dL '15 14 13  '$ Creatinine 0.44 - 1.00 mg/dL 0.80 0.93 0.81  Sodium 135 - 145 mmol/L 141 142 142  Potassium 3.5 - 5.1 mmol/L 3.8 3.6 3.4(L)  Chloride 98 - 111 mmol/L 107 104 107  CO2 22 - 32 mmol/L '28 28 28  '$ Calcium  8.9 - 10.3 mg/dL 9.2 9.2 9.0  Total Protein 6.5 - 8.1 g/dL 6.6 7.2 6.7  Total Bilirubin 0.3 - 1.2 mg/dL 0.7 1.0 0.6  Alkaline Phos 38 - 126 U/L 57 62 57  AST 15 - 41 U/L '22 21 23  '$ ALT 0 - 44 U/L '19 19 26      '$ RADIOGRAPHIC STUDIES: I have personally reviewed the radiological images as listed and agreed with the findings in the report. No results found.   ASSESSMENT & PLAN:  KAYLEANN MCCAFFERY is a 69 y.o. 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 -She completed 5/6 cycles of neoadjuvantTCHP, she did not tolerate well.  -She underwent right lumpectomy by Dr. Barry Dienes on 05/19/19. We discussed herpathologyreport which shows complete response from neoadjuvant chemo, negative5nodes. Thispredicts very low risk ofrecurrence. -She recently completed adjuvant radiation on 07/15/19.  -She started maintenanceHerceptinevery 3 weeks on 05/04/19 to continue to complete 1  year treatment from 12/2018. I previously discussed the option of Herceptin injection. She opted to continue with infusion.Given her negative lymph nodes at the initial diagnosis, benefits ofadjuvant Perjeta is minimal, we d/c Perjeta since 05/04/19.  -She is clinically stable. Physical exam unremarkable except recent skin rash of left thigh. Labs reviewed, WBC 3.1, PLT 147K. Overall adequate to proceed with Herceptin today.  -She would like to stop treatment after today. I discussed 1 year treatment is standard care based on NCCN guideline although some clinical trial data showed similar benefit of 6 months therapy. She notes her main concern remains financial burden paying for bills. She has spoken with billing and Financial Advocate.  -I discussed breast cancer surveillance after treatment today. She will continue annual screening mammogram, self exam, and a routine office visit with lab and exam with [place]. I reviewed concerning side effects of recurrence to watch for.  -She is due for Mammogram in 11/2019 -F/u in 4 months. I offered the option to attend survivorship clinic, she declined.    2.Neuropathy, weight loss, secondary to chemo  -Since completing chemo she has mild residual neuropathy in her feet and fingers. I discussed this can take a while to recover. I previously suggested she use OTC B complex. If not enough I can call in Gabapentin.  -She has been able to maintain her weight now and her hair and nail growth is returning.  -She has continued on Protonix after chemo. I discussed longterm use comes with more side effects. I encouraged her to start coming off Protonix. She is agreeable.     3. Socialand financialsupport  -She is single, lives alone and has no children. She lives 1-2 miles from cancer center.  -She notes she has friends but overall not much family support. -She is so far able to do all self-care -She has medicare and on social security as her only income.  -She  notes she has been given some financial aid from [place] but still notes her concerns with future medical bills in 2021. She will f/u with her Financial advocate -She has decided to stop treatment after 10/25/19. I encouraged her to let us know if her copay is a financial burden moving forward.   4. Skin rash of left thigh and abdomen -likely secondary to poison oak exposure. Rash presented last night. Mild on exam today (10/27/19).  -She will continue Benadryl and I recommend OTC hydrocortisone cream    PLAN: -Labs  reviewed and adequate to proceed with Herceptin today. Per pt's request, will stop after today's dose  -will message Dr. Barry Dienes to remove her port  -Will notify SW she is open to volunteer to drive other patients as needed -Lab and F/u in 4 months  -Mammogram in 11/2019    No problem-specific Assessment & Plan notes found for this encounter.   Orders Placed This Encounter  Procedures   MM DIAG BREAST TOMO BILATERAL    Standing Status:   Future    Standing Expiration Date:   10/26/2020    Order Specific Question:   Reason for Exam (SYMPTOM  OR DIAGNOSIS REQUIRED)    Answer:   screening    Order Specific Question:   Preferred imaging location?    Answer:   [facility]   All questions were answered. The patient knows to call the clinic with any problems, questions or concerns. No barriers to learning was detected. The total time spent in the appointment was 30 minutes.     [name], MD 10/27/2019   I, Christina Sexton, am acting as scribe for [name], MD.   I have reviewed the above documentation for accuracy and completeness, and I agree with the above.

## 2019-10-26 ENCOUNTER — Encounter: Payer: Self-pay | Admitting: *Deleted

## 2019-10-27 ENCOUNTER — Inpatient Hospital Stay (HOSPITAL_BASED_OUTPATIENT_CLINIC_OR_DEPARTMENT_OTHER): Payer: Medicare HMO | Admitting: Hematology

## 2019-10-27 ENCOUNTER — Inpatient Hospital Stay: Payer: Medicare HMO

## 2019-10-27 ENCOUNTER — Other Ambulatory Visit: Payer: Self-pay

## 2019-10-27 VITALS — BP 123/86 | HR 74 | Temp 98.9°F | Resp 18 | Ht 63.0 in | Wt 100.9 lb

## 2019-10-27 DIAGNOSIS — C50411 Malignant neoplasm of upper-outer quadrant of right female breast: Secondary | ICD-10-CM

## 2019-10-27 DIAGNOSIS — Z171 Estrogen receptor negative status [ER-]: Secondary | ICD-10-CM

## 2019-10-27 DIAGNOSIS — Z5112 Encounter for antineoplastic immunotherapy: Secondary | ICD-10-CM | POA: Diagnosis not present

## 2019-10-27 DIAGNOSIS — Z95828 Presence of other vascular implants and grafts: Secondary | ICD-10-CM

## 2019-10-27 LAB — CBC WITH DIFFERENTIAL (CANCER CENTER ONLY)
Abs Immature Granulocytes: 0 10*3/uL (ref 0.00–0.07)
Basophils Absolute: 0 10*3/uL (ref 0.0–0.1)
Basophils Relative: 1 %
Eosinophils Absolute: 0.1 10*3/uL (ref 0.0–0.5)
Eosinophils Relative: 3 %
HCT: 38 % (ref 36.0–46.0)
Hemoglobin: 12.2 g/dL (ref 12.0–15.0)
Immature Granulocytes: 0 %
Lymphocytes Relative: 21 %
Lymphs Abs: 0.6 10*3/uL — ABNORMAL LOW (ref 0.7–4.0)
MCH: 31 pg (ref 26.0–34.0)
MCHC: 32.1 g/dL (ref 30.0–36.0)
MCV: 96.7 fL (ref 80.0–100.0)
Monocytes Absolute: 0.4 10*3/uL (ref 0.1–1.0)
Monocytes Relative: 13 %
Neutro Abs: 1.9 10*3/uL (ref 1.7–7.7)
Neutrophils Relative %: 62 %
Platelet Count: 147 10*3/uL — ABNORMAL LOW (ref 150–400)
RBC: 3.93 MIL/uL (ref 3.87–5.11)
RDW: 13 % (ref 11.5–15.5)
WBC Count: 3.1 10*3/uL — ABNORMAL LOW (ref 4.0–10.5)
nRBC: 0 % (ref 0.0–0.2)

## 2019-10-27 LAB — CMP (CANCER CENTER ONLY)
ALT: 19 U/L (ref 0–44)
AST: 22 U/L (ref 15–41)
Albumin: 4.1 g/dL (ref 3.5–5.0)
Alkaline Phosphatase: 57 U/L (ref 38–126)
Anion gap: 6 (ref 5–15)
BUN: 15 mg/dL (ref 8–23)
CO2: 28 mmol/L (ref 22–32)
Calcium: 9.2 mg/dL (ref 8.9–10.3)
Chloride: 107 mmol/L (ref 98–111)
Creatinine: 0.8 mg/dL (ref 0.44–1.00)
GFR, Est AFR Am: >60 mL/min (ref >60)
GFR, Estimated: >60 mL/min (ref >60)
Glucose, Bld: 94 mg/dL (ref 70–99)
Potassium: 3.8 mmol/L (ref 3.5–5.1)
Sodium: 141 mmol/L (ref 135–145)
Total Bilirubin: 0.7 mg/dL (ref 0.3–1.2)
Total Protein: 6.6 g/dL (ref 6.5–8.1)

## 2019-10-27 MED ORDER — TRASTUZUMAB-ANNS CHEMO 150 MG IV SOLR
300.0000 mg | Freq: Once | INTRAVENOUS | Status: AC
  Administered 2019-10-27: 300 mg via INTRAVENOUS
  Filled 2019-10-27: qty 14.29

## 2019-10-27 MED ORDER — SODIUM CHLORIDE 0.9 % IV SOLN
Freq: Once | INTRAVENOUS | Status: AC
  Filled 2019-10-27: qty 250

## 2019-10-27 MED ORDER — ACETAMINOPHEN 325 MG PO TABS
ORAL_TABLET | ORAL | Status: AC
  Filled 2019-10-27: qty 2

## 2019-10-27 MED ORDER — SODIUM CHLORIDE 0.9% FLUSH
10.0000 mL | Freq: Once | INTRAVENOUS | Status: AC
  Administered 2019-10-27: 10:00:00 10 mL
  Filled 2019-10-27: qty 10

## 2019-10-27 MED ORDER — ACETAMINOPHEN 325 MG PO TABS
650.0000 mg | ORAL_TABLET | Freq: Once | ORAL | Status: AC
  Administered 2019-10-27: 650 mg via ORAL

## 2019-10-27 MED ORDER — DIPHENHYDRAMINE HCL 25 MG PO CAPS
50.0000 mg | ORAL_CAPSULE | Freq: Once | ORAL | Status: AC
  Administered 2019-10-27: 50 mg via ORAL

## 2019-10-27 MED ORDER — SODIUM CHLORIDE 0.9% FLUSH
10.0000 mL | INTRAVENOUS | Status: DC | PRN
  Administered 2019-10-27: 10 mL
  Filled 2019-10-27: qty 10

## 2019-10-27 MED ORDER — DIPHENHYDRAMINE HCL 25 MG PO CAPS
ORAL_CAPSULE | ORAL | Status: AC
  Filled 2019-10-27: qty 2

## 2019-10-27 MED ORDER — HEPARIN SOD (PORK) LOCK FLUSH 100 UNIT/ML IV SOLN
500.0000 [IU] | Freq: Once | INTRAVENOUS | Status: AC | PRN
  Administered 2019-10-27: 12:00:00 500 [IU]
  Filled 2019-10-27: qty 5

## 2019-10-27 NOTE — Patient Instructions (Signed)
[facility] Discharge Instructions for Patients Receiving Chemotherapy  Today you received the following chemotherapy agents [place]  To help prevent nausea and vomiting after your treatment, we encourage you to take your nausea medication as directed   If you develop nausea and vomiting that is not controlled by your nausea medication, call the clinic.   BELOW ARE SYMPTOMS THAT SHOULD BE REPORTED IMMEDIATELY:  *FEVER GREATER THAN 100.5 F  *CHILLS WITH OR WITHOUT FEVER  NAUSEA AND VOMITING THAT IS NOT CONTROLLED WITH YOUR NAUSEA MEDICATION  *UNUSUAL SHORTNESS OF BREATH  *UNUSUAL BRUISING OR BLEEDING  TENDERNESS IN MOUTH AND THROAT WITH OR WITHOUT PRESENCE OF ULCERS  *URINARY PROBLEMS  *BOWEL PROBLEMS  UNUSUAL RASH Items with * indicate a potential emergency and should be followed up as soon as possible.  Feel free to call the clinic should you have any questions or concerns. The clinic phone number is [phone].  Please show the [facility] at check-in to the Emergency Department and triage nurse.

## 2019-10-28 ENCOUNTER — Other Ambulatory Visit: Payer: Self-pay | Admitting: General Surgery

## 2019-10-28 ENCOUNTER — Encounter: Payer: Self-pay | Admitting: Hematology

## 2019-10-28 ENCOUNTER — Telehealth: Payer: Self-pay | Admitting: Hematology

## 2019-10-28 NOTE — Telephone Encounter (Signed)
Scheduled appt per 4/28 los.  Spoke with pt and she is aware of the appt date and time.

## 2019-10-29 ENCOUNTER — Telehealth: Payer: Self-pay | Admitting: General Practice

## 2019-10-29 NOTE — Telephone Encounter (Signed)
[facility] CSW Progress Notes  Call to patient - she had mentioned wanting to volunteer to help patients with cancer.  Would like to volunteer providing transportation or similar supportive services for patients diagnosed w cancer as way to give back.  Explained that most volunteer programs are on hold at present, but [facility] and [employer] may be ways to plug in once pandemic restrictions are lifted.  She will call back when facilities are open to volunteers in the future.  Edwyna Shell, LCSW Clinical Social Worker Phone:  [phone] Cell:  [phone]

## 2019-11-11 ENCOUNTER — Encounter: Payer: Self-pay | Admitting: *Deleted

## 2019-11-18 DIAGNOSIS — M810 Age-related osteoporosis without current pathological fracture: Secondary | ICD-10-CM | POA: Diagnosis not present

## 2019-11-18 DIAGNOSIS — I10 Essential (primary) hypertension: Secondary | ICD-10-CM | POA: Diagnosis not present

## 2019-11-18 DIAGNOSIS — Z0000 Encounter for general adult medical examination without abnormal findings: Secondary | ICD-10-CM | POA: Diagnosis not present

## 2019-11-18 DIAGNOSIS — E7800 Pure hypercholesterolemia, unspecified: Secondary | ICD-10-CM | POA: Diagnosis not present

## 2019-11-24 DIAGNOSIS — R319 Hematuria, unspecified: Secondary | ICD-10-CM | POA: Diagnosis not present

## 2019-12-03 ENCOUNTER — Other Ambulatory Visit (HOSPITAL_COMMUNITY)
Admission: RE | Admit: 2019-12-03 | Discharge: 2019-12-03 | Disposition: A | Discharge status: Home / Self Care | Payer: Medicare HMO | Source: Ambulatory Visit | Attending: General Surgery | Admitting: General Surgery

## 2019-12-03 DIAGNOSIS — Z20822 Contact with and (suspected) exposure to covid-19: Secondary | ICD-10-CM | POA: Insufficient documentation

## 2019-12-03 DIAGNOSIS — Z01812 Encounter for preprocedural laboratory examination: Secondary | ICD-10-CM | POA: Insufficient documentation

## 2019-12-04 LAB — SARS CORONAVIRUS 2 (TAT 6-24 HRS): SARS Coronavirus 2: NEGATIVE

## 2019-12-06 ENCOUNTER — Encounter (HOSPITAL_COMMUNITY): Payer: Self-pay | Admitting: General Surgery

## 2019-12-06 NOTE — Progress Notes (Signed)
Christina Sexton denies chest pain or shortness of breath.  Patient tested negative for Covid_6/4/2020_ and has been in quarantine since that time.

## 2019-12-07 ENCOUNTER — Other Ambulatory Visit: Payer: Self-pay

## 2019-12-07 ENCOUNTER — Encounter (HOSPITAL_COMMUNITY)
Admission: RE | Disposition: A | Discharge status: Home / Self Care | Payer: Self-pay | Source: Home / Self Care | Attending: General Surgery

## 2019-12-07 ENCOUNTER — Ambulatory Visit (HOSPITAL_COMMUNITY): Payer: Medicare HMO | Admitting: Certified Registered Nurse Anesthetist

## 2019-12-07 ENCOUNTER — Encounter (HOSPITAL_COMMUNITY): Payer: Self-pay | Admitting: General Surgery

## 2019-12-07 ENCOUNTER — Ambulatory Visit (HOSPITAL_COMMUNITY)
Admission: RE | Admit: 2019-12-07 | Discharge: 2019-12-07 | Disposition: A | Discharge status: Home / Self Care | Payer: Medicare HMO | Attending: General Surgery | Admitting: General Surgery

## 2019-12-07 DIAGNOSIS — Z452 Encounter for adjustment and management of vascular access device: Secondary | ICD-10-CM | POA: Diagnosis not present

## 2019-12-07 DIAGNOSIS — I341 Nonrheumatic mitral (valve) prolapse: Secondary | ICD-10-CM | POA: Insufficient documentation

## 2019-12-07 DIAGNOSIS — C50412 Malignant neoplasm of upper-outer quadrant of left female breast: Secondary | ICD-10-CM | POA: Diagnosis not present

## 2019-12-07 DIAGNOSIS — I129 Hypertensive chronic kidney disease with stage 1 through stage 4 chronic kidney disease, or unspecified chronic kidney disease: Secondary | ICD-10-CM | POA: Diagnosis not present

## 2019-12-07 DIAGNOSIS — D649 Anemia, unspecified: Secondary | ICD-10-CM | POA: Insufficient documentation

## 2019-12-07 DIAGNOSIS — Z9221 Personal history of antineoplastic chemotherapy: Secondary | ICD-10-CM | POA: Diagnosis not present

## 2019-12-07 DIAGNOSIS — N189 Chronic kidney disease, unspecified: Secondary | ICD-10-CM | POA: Diagnosis not present

## 2019-12-07 DIAGNOSIS — E785 Hyperlipidemia, unspecified: Secondary | ICD-10-CM | POA: Insufficient documentation

## 2019-12-07 DIAGNOSIS — Z923 Personal history of irradiation: Secondary | ICD-10-CM | POA: Insufficient documentation

## 2019-12-07 DIAGNOSIS — Z853 Personal history of malignant neoplasm of breast: Secondary | ICD-10-CM | POA: Insufficient documentation

## 2019-12-07 HISTORY — DX: Cardiac murmur, unspecified: R01.1

## 2019-12-07 HISTORY — PX: PORT-A-CATH REMOVAL: SHX5289

## 2019-12-07 SURGERY — REMOVAL PORT-A-CATH
Anesthesia: Monitor Anesthesia Care | Site: Chest | Laterality: Left

## 2019-12-07 MED ORDER — LIDOCAINE 2% (20 MG/ML) 5 ML SYRINGE
INTRAMUSCULAR | Status: AC
  Filled 2019-12-07: qty 5

## 2019-12-07 MED ORDER — LIDOCAINE-EPINEPHRINE 1 %-1:100000 IJ SOLN
INTRAMUSCULAR | Status: AC
  Filled 2019-12-07: qty 1

## 2019-12-07 MED ORDER — 0.9 % SODIUM CHLORIDE (POUR BTL) OPTIME
TOPICAL | Status: DC | PRN
  Administered 2019-12-07: 1000 mL

## 2019-12-07 MED ORDER — LACTATED RINGERS IV SOLN: INTRAVENOUS | Status: DC

## 2019-12-07 MED ORDER — BUPIVACAINE HCL (PF) 0.25 % IJ SOLN
INTRAMUSCULAR | Status: AC
  Filled 2019-12-07: qty 30

## 2019-12-07 MED ORDER — PROPOFOL 500 MG/50ML IV EMUL
INTRAVENOUS | Status: AC
  Filled 2019-12-07: qty 50

## 2019-12-07 MED ORDER — BUPIVACAINE HCL (PF) 0.25 % IJ SOLN
INTRAMUSCULAR | Status: DC | PRN
  Administered 2019-12-07: 20 mL

## 2019-12-07 MED ORDER — PROPOFOL 10 MG/ML IV BOLUS
INTRAVENOUS | Status: DC | PRN
  Administered 2019-12-07: 10 mg via INTRAVENOUS
  Administered 2019-12-07: 20 mg via INTRAVENOUS

## 2019-12-07 MED ORDER — CEFAZOLIN SODIUM-DEXTROSE 2-4 GM/100ML-% IV SOLN
2.0000 g | INTRAVENOUS | Status: AC
  Administered 2019-12-07: 2 g via INTRAVENOUS
  Filled 2019-12-07: qty 100

## 2019-12-07 MED ORDER — MIDAZOLAM HCL 2 MG/2ML IJ SOLN
INTRAMUSCULAR | Status: AC
  Filled 2019-12-07: qty 2

## 2019-12-07 MED ORDER — PROPOFOL 10 MG/ML IV BOLUS
INTRAVENOUS | Status: AC
  Filled 2019-12-07: qty 40

## 2019-12-07 MED ORDER — ONDANSETRON HCL 4 MG/2ML IJ SOLN
INTRAMUSCULAR | Status: DC | PRN
  Administered 2019-12-07: 4 mg via INTRAVENOUS

## 2019-12-07 MED ORDER — MIDAZOLAM HCL 2 MG/2ML IJ SOLN
INTRAMUSCULAR | Status: DC | PRN
Start: 2019-12-07 — End: 2019-12-07
  Administered 2019-12-07 (×2): 1 mg via INTRAVENOUS

## 2019-12-07 MED ORDER — CHLORHEXIDINE GLUCONATE CLOTH 2 % EX PADS: 6.0000 | MEDICATED_PAD | Freq: Once | CUTANEOUS | Status: DC

## 2019-12-07 MED ORDER — ACETAMINOPHEN 500 MG PO TABS
1000.0000 mg | ORAL_TABLET | ORAL | Status: AC
  Administered 2019-12-07: 1000 mg via ORAL
  Filled 2019-12-07: qty 2

## 2019-12-07 MED ORDER — FENTANYL CITRATE (PF) 100 MCG/2ML IJ SOLN
INTRAMUSCULAR | Status: AC
  Filled 2019-12-07: qty 2

## 2019-12-07 MED ORDER — LIDOCAINE-EPINEPHRINE 1 %-1:100000 IJ SOLN
INTRAMUSCULAR | Status: DC | PRN
  Administered 2019-12-07: 20 mL

## 2019-12-07 MED ORDER — PROPOFOL 500 MG/50ML IV EMUL
INTRAVENOUS | Status: DC | PRN
  Administered 2019-12-07: 75 ug/kg/min via INTRAVENOUS

## 2019-12-07 MED ORDER — LIDOCAINE HCL (CARDIAC) PF 100 MG/5ML IV SOSY
PREFILLED_SYRINGE | INTRAVENOUS | Status: DC | PRN
  Administered 2019-12-07: 40 mg via INTRAVENOUS

## 2019-12-07 MED ORDER — ORAL CARE MOUTH RINSE: 15.0000 mL | Freq: Once | OROMUCOSAL | Status: AC

## 2019-12-07 MED ORDER — CHLORHEXIDINE GLUCONATE 0.12 % MT SOLN
15.0000 mL | Freq: Once | OROMUCOSAL | Status: AC
  Administered 2019-12-07: 15 mL via OROMUCOSAL
  Filled 2019-12-07: qty 15

## 2019-12-07 MED ORDER — FENTANYL CITRATE (PF) 100 MCG/2ML IJ SOLN
INTRAMUSCULAR | Status: DC | PRN
  Administered 2019-12-07 (×2): 50 ug via INTRAVENOUS

## 2019-12-07 SURGICAL SUPPLY — 29 items
ADH SKN CLS APL DERMABOND .7 (GAUZE/BANDAGES/DRESSINGS) ×1
CHLORAPREP W/TINT 10.5 ML (MISCELLANEOUS) ×2 IMPLANT
COVER SURGICAL LIGHT HANDLE (MISCELLANEOUS) ×1 IMPLANT
DECANTER SPIKE VIAL GLASS SM (MISCELLANEOUS) ×3 IMPLANT
DERMABOND ADVANCED (GAUZE/BANDAGES/DRESSINGS) ×1
DERMABOND ADVANCED .7 DNX12 (GAUZE/BANDAGES/DRESSINGS) ×1 IMPLANT
DRAPE LAPAROTOMY 100X72 PEDS (DRAPES) ×2 IMPLANT
ELECT CAUTERY BLADE 6.4 (BLADE) ×1 IMPLANT
ELECT REM PT RETURN 9FT ADLT (ELECTROSURGICAL) ×2
ELECTRODE REM PT RTRN 9FT ADLT (ELECTROSURGICAL) ×1 IMPLANT
GAUZE 4X4 16PLY RFD (DISPOSABLE) ×1 IMPLANT
GLOVE BIO SURGEON STRL SZ 6 (GLOVE) ×2 IMPLANT
GLOVE INDICATOR 6.5 STRL GRN (GLOVE) ×2 IMPLANT
GOWN STRL REUS W/ TWL LRG LVL3 (GOWN DISPOSABLE) ×1 IMPLANT
GOWN STRL REUS W/TWL 2XL LVL3 (GOWN DISPOSABLE) ×2 IMPLANT
GOWN STRL REUS W/TWL LRG LVL3 (GOWN DISPOSABLE) ×2
KIT BASIN OR (CUSTOM PROCEDURE TRAY) ×2 IMPLANT
KIT TURNOVER KIT B (KITS) ×2 IMPLANT
NDL HYPO 25GX1X1/2 BEV (NEEDLE) ×1 IMPLANT
NEEDLE HYPO 25GX1X1/2 BEV (NEEDLE) IMPLANT
NS IRRIG 1000ML POUR BTL (IV SOLUTION) ×2 IMPLANT
PACK GENERAL/GYN (CUSTOM PROCEDURE TRAY) ×2 IMPLANT
PAD ARMBOARD 7.5X6 YLW CONV (MISCELLANEOUS) ×4 IMPLANT
SUT MON AB 4-0 PC3 18 (SUTURE) ×2 IMPLANT
SUT VIC AB 3-0 SH 27 (SUTURE) ×2
SUT VIC AB 3-0 SH 27X BRD (SUTURE) ×1 IMPLANT
SYR CONTROL 10ML LL (SYRINGE) ×2 IMPLANT
TOWEL GREEN STERILE (TOWEL DISPOSABLE) ×2 IMPLANT
TOWEL GREEN STERILE FF (TOWEL DISPOSABLE) ×2 IMPLANT

## 2019-12-07 NOTE — H&P (Signed)
Christina Sexton is an 69 y.o. female.   Chief Complaint: h/o right breast cancer HPI:  Pt is a 69 yo F s/p breast conservation treatment for right breast cancer.  She has completed chemotherapy and desires port removal.  She is doing well overall.  She denies pain at the port site.    Past Medical History:  Diagnosis Date  . Allergy   . Anemia   . Asthma    as a child   . Cancer ([place])    Breast-R  . Chronic kidney disease   . Heart murmur    MVP  . History of kidney stones     x 3 passed 1  . History of radiation therapy 06/16/19- 07/15/19   Right Breast 15 fractions of 2.67 Gy to total 40.05 Gy. Right Breast boost 5 fractions of 2 Gy to total 10 Gy  . Hyperlipidemia   . Hypertension   . Kidney stone   . Mitral valve prolapse   . Osteoporosis     Past Surgical History:  Procedure Laterality Date  . BREAST BIOPSY Left 12/27/2015   benign  . BREAST BIOPSY Right 07/26/2013   benign  . BREAST EXCISIONAL BIOPSY Right 09/08/2013   high risk   . BREAST LUMPECTOMY WITH RADIOACTIVE SEED AND SENTINEL LYMPH NODE BIOPSY Right 05/19/2019   Procedure: BREAST LUMPECTOMY WITH RADIOACTIVE SEED X2  AND SENTINEL LYMPH NODE BIOPSY;  Surgeon: [name], MD;  Location: [facility];  Service: General;  Laterality: Right;  . COLONOSCOPY W/ POLYPECTOMY    . CYSTOSCOPY    . PORTACATH PLACEMENT N/A 12/28/2018   Procedure: INSERTION PORT-A-CATH;  Surgeon: [name], MD;  [facility];  Service: General;  Laterality: N/A;  . SHOULDER SURGERY Right    frozen shoulder and bone spur    Family History  Problem Relation Age of Onset  . Colon cancer Brother   . COPD Mother   . Diabetes Father   . Esophageal cancer Neg Hx   . Liver cancer Neg Hx   . Pancreatic cancer Neg Hx   . Rectal cancer Neg Hx   . Stomach cancer Neg Hx    Social History:  reports that she has quit smoking. She has never used smokeless tobacco. She reports current alcohol use. She reports that she does not use  drugs.  Allergies:  Allergies  Allergen Reactions  . Oxycodone Other (See Comments)    "kept me awake" "talked out of my head"    Facility-Administered Medications Prior to Admission  Medication Dose Route Frequency Provider Last Rate Last Admin  . 0.9 %  sodium chloride infusion  500 mL Intravenous Once Nandigam, Venia Minks, MD       Medications Prior to Admission  Medication Sig Dispense Refill  . atorvastatin (LIPITOR) 20 MG tablet Take 20 mg by mouth at bedtime.     . pantoprazole (PROTONIX) 20 MG tablet TAKE 1 TABLET BY MOUTH EVERY DAY (Patient taking differently: Take 20 mg by mouth daily. ) 30 tablet 1  . diphenhydrAMINE (BENADRYL) 25 MG tablet Take 25 mg by mouth daily as needed for allergies.    . potassium chloride SA (KLOR-CON) 20 MEQ tablet Take 1 tablet (20 mEq total) by mouth every other day. (Patient not taking: Reported on 11/24/2019) 30 tablet 1    No results found for this or any previous visit (from the past 48 hour(s)). No results found.  Review of Systems  Skin:  Some chronic right breast pain at lumpectomy/XRT site.    All other systems reviewed and are negative.   Blood pressure (!) 153/62, pulse 73, temperature (!) 97.3 F (36.3 C), resp. rate 18, height 5\' 3"  (1.6 m), weight 46.7 kg, SpO2 98 %. Physical Exam  Constitutional: She is oriented to person, place, and time. She appears well-developed and well-nourished. No distress.  HENT:  Head: Normocephalic and atraumatic.  Eyes: Pupils are equal, round, and reactive to light. Conjunctivae are normal. No scleral icterus.  Neck: No tracheal deviation present.  Cardiovascular: Normal rate and intact distal pulses.  Respiratory: Effort normal. No respiratory distress.  Left subclavian port in place.  Musculoskeletal:     Cervical back: Neck supple.  Neurological: She is alert and oriented to person, place, and time. Coordination normal.  Skin: Skin is warm and dry. No rash noted. She is not  diaphoretic. No erythema. No pallor.  Psychiatric: She has a normal mood and affect. Her behavior is normal. Judgment and thought content normal.     Assessment/Plan H/o right breast cancer S/p chemotherapy Port in place.  Plan port removal.   Reviewed surgery and post op restrictions. Discussed risks.   She wishes to proceed.   Pt states that she has pain medication at home and doesn't need another script.    [name], MD 12/07/2019, 3:05 PM

## 2019-12-07 NOTE — Op Note (Signed)
  PRE-OPERATIVE DIAGNOSIS:  un-needed Port-A-Cath for right breast cancer  POST-OPERATIVE DIAGNOSIS:  Same   PROCEDURE:  Procedure(s):  REMOVAL PORT-A-CATH  SURGEON:  Surgeon(s):  Honi Name, MD  ANESTHESIA:   MAC + local  EBL:   Minimal  SPECIMEN:  None  Complications : none known  Procedure:   Pt was  identified in the holding area and taken to the operating room where she was placed supine on the operating room table.  MAC anesthesia was induced.  The left upper chest was prepped and draped.  The prior incision was anesthetized with local anesthetic.  The incision was opened with a #15 blade.  The subcutaneous tissue was divided with the cautery.  The port was identified and the capsule opened.  The four 2-0 prolene sutures were removed.  The port was then removed and pressure held on the tract.  The catheter appeared intact without evidence of breakage, length was 22 cm.  The wound was inspected for hemostasis, which was achieved with cautery.  The wound was closed with 3-0 vicryl deep dermal interrupted sutures and 4-0 Monocryl running subcuticular suture.  The wound was cleaned, dried, and dressed with dermabond.  The patient was awakened from anesthesia and taken to the PACU in stable condition.  Needle, sponge, and instrument counts are correct.     

## 2019-12-07 NOTE — Anesthesia Preprocedure Evaluation (Signed)
Anesthesia Evaluation  Patient identified by MRN, date of birth, ID band Patient awake    Reviewed: Allergy & Precautions, NPO status , Patient's Chart, lab work & pertinent test results  Airway Mallampati: II  TM Distance: >3 FB Neck ROM: Full    Dental no notable dental hx. (+) Teeth Intact   Pulmonary asthma , former smoker,    Pulmonary exam normal breath sounds clear to auscultation       Cardiovascular hypertension, Normal cardiovascular exam+ Valvular Problems/Murmurs MVP  Rhythm:Regular Rate:Normal  In situ port a cath   Neuro/Psych negative neurological ROS  negative psych ROS   GI/Hepatic Neg liver ROS, GERD  Medicated and Controlled,  Endo/Other  Hyperlipidemia Hx/o Right breast Ca S/P RT and ChemoRx  Renal/GU Renal diseaseHx/o renal calculi  negative genitourinary   Musculoskeletal negative musculoskeletal ROS (+)   Abdominal   Peds  Hematology  (+) anemia ,   Anesthesia Other Findings   Reproductive/Obstetrics                             Anesthesia Physical Anesthesia Plan  ASA: II  Anesthesia Plan: MAC   Post-op Pain Management:    Induction: Intravenous  PONV Risk Score and Plan: 3 and Ondansetron, Treatment may vary due to age or medical condition, Propofol infusion and Midazolam  Airway Management Planned: Natural Airway, Nasal Cannula and Simple Face Mask  Additional Equipment:   Intra-op Plan:   Post-operative Plan:   Informed Consent: I have reviewed the patients History and Physical, chart, labs and discussed the procedure including the risks, benefits and alternatives for the proposed anesthesia with the patient or authorized representative who has indicated his/her understanding and acceptance.     Dental advisory given  Plan Discussed with: CRNA and Anesthesiologist  Anesthesia Plan Comments:         Anesthesia Quick Evaluation

## 2019-12-07 NOTE — Discharge Instructions (Signed)
[place] Office Phone Number [phone]   POST OP INSTRUCTIONS  Always review your discharge instruction sheet given to you by the facility where your surgery was performed.  IF YOU HAVE DISABILITY OR FAMILY LEAVE FORMS, YOU MUST BRING THEM TO THE OFFICE FOR PROCESSING.  DO NOT GIVE THEM TO YOUR DOCTOR.  1. A prescription for pain medication may be given to you upon discharge.  Take your pain medication as prescribed, if needed.  If narcotic pain medicine is not needed, then you may take acetaminophen (Tylenol) or ibuprofen (Advil) as needed. 2. Take your usually prescribed medications unless otherwise directed 3. If you need a refill on your pain medication, please contact your pharmacy.  They will contact our office to request authorization.  Prescriptions will not be filled after 5pm or on week-ends. 4. You should eat very light the first 24 hours after surgery, such as soup, crackers, pudding, etc.  Resume your normal diet the day after surgery 5. It is common to experience some constipation if taking pain medication after surgery.  Increasing fluid intake and taking a stool softener will usually help or prevent this problem from occurring.  A mild laxative (Milk of Magnesia or Miralax) should be taken according to package directions if there are no bowel movements after 48 hours. 6. You may shower in 48 hours.  The surgical glue will flake off in 2-3 weeks.   7. ACTIVITIES:  No strenuous activity or heavy lifting for 1 week.   a. You may drive when you no longer are taking prescription pain medication, you can comfortably wear a seatbelt, and you can safely maneuver your car and apply brakes. b. RETURN TO WORK:  __________as tolerated._______________ Dennis Bast should see your doctor in the office for a follow-up appointment approximately three-four weeks after your surgery.    WHEN TO CALL YOUR DOCTOR: 1. Fever over 101.0 2. Nausea and/or vomiting. 3. Extreme swelling or  bruising. 4. Continued bleeding from incision. 5. Increased pain, redness, or drainage from the incision.  The clinic staff is available to answer your questions during regular business hours.  Please don't hesitate to call and ask to speak to one of the nurses for clinical concerns.  If you have a medical emergency, go to the nearest emergency room or call 911.  A surgeon from [facility] is always on call at the hospital.  For further questions, please visit [email or web address]

## 2019-12-07 NOTE — Progress Notes (Signed)
Per Dr. Royce Macadamia, no labs needed.

## 2019-12-07 NOTE — Transfer of Care (Signed)
Immediate Anesthesia Transfer of Care Note  Patient: Christina Sexton  Procedure(s) Performed: REMOVAL PORT-A-CATH (Left Chest)  Patient Location: PACU  Anesthesia Type:MAC  Level of Consciousness: drowsy and patient cooperative  Airway & Oxygen Therapy: Patient Spontanous Breathing and Patient connected to face mask oxygen  Post-op Assessment: Report given to RN and Post -op Vital signs reviewed and stable  Post vital signs: Reviewed and stable  Last Vitals:  Vitals Value Taken Time  BP 108/67 12/07/19 1608  Temp    Pulse 72 12/07/19 1608  Resp    SpO2 100 % 12/07/19 1608  Vitals shown include unvalidated device data.  Last Pain: There were no vitals filed for this visit.    Patients Stated Pain Goal: 2 (20/10/07 1219)  Complications: No apparent anesthesia complications

## 2019-12-07 NOTE — Anesthesia Postprocedure Evaluation (Signed)
Anesthesia Post Note  Patient: Christina Sexton  Procedure(s) Performed: REMOVAL PORT-A-CATH (Left Chest)     Patient location during evaluation: Phase II Anesthesia Type: MAC Level of consciousness: awake Pain management: pain level controlled Vital Signs Assessment: post-procedure vital signs reviewed and stable Respiratory status: spontaneous breathing Cardiovascular status: stable Postop Assessment: no apparent nausea or vomiting Anesthetic complications: no    Last Vitals:  Vitals:   12/07/19 1213 12/07/19 1610  BP: (!) 153/62 108/67  Pulse:    Resp:    Temp:  36.7 C  SpO2:      Last Pain:  Vitals:   12/07/19 1621  PainSc: 0-No pain   Pain Goal: Patients Stated Pain Goal: 2 (12/07/19 1259)                 Huston Foley

## 2019-12-19 ENCOUNTER — Other Ambulatory Visit: Payer: Self-pay | Admitting: Hematology

## 2019-12-27 ENCOUNTER — Other Ambulatory Visit: Payer: Self-pay

## 2019-12-27 ENCOUNTER — Ambulatory Visit
Admission: RE | Admit: 2019-12-27 | Discharge: 2019-12-27 | Disposition: A | Discharge status: Home / Self Care | Payer: Medicare HMO | Source: Ambulatory Visit | Attending: Hematology | Admitting: Hematology

## 2019-12-27 DIAGNOSIS — R922 Inconclusive mammogram: Secondary | ICD-10-CM | POA: Diagnosis not present

## 2019-12-27 DIAGNOSIS — Z171 Estrogen receptor negative status [ER-]: Secondary | ICD-10-CM

## 2019-12-27 DIAGNOSIS — Z853 Personal history of malignant neoplasm of breast: Secondary | ICD-10-CM | POA: Diagnosis not present

## 2019-12-27 DIAGNOSIS — C50411 Malignant neoplasm of upper-outer quadrant of right female breast: Secondary | ICD-10-CM

## 2020-01-17 DIAGNOSIS — H25013 Cortical age-related cataract, bilateral: Secondary | ICD-10-CM | POA: Diagnosis not present

## 2020-01-17 DIAGNOSIS — H2513 Age-related nuclear cataract, bilateral: Secondary | ICD-10-CM | POA: Diagnosis not present

## 2020-01-17 DIAGNOSIS — H524 Presbyopia: Secondary | ICD-10-CM | POA: Diagnosis not present

## 2020-01-17 DIAGNOSIS — H47233 Glaucomatous optic atrophy, bilateral: Secondary | ICD-10-CM | POA: Diagnosis not present

## 2020-01-21 DIAGNOSIS — Z0100 Encounter for examination of eyes and vision without abnormal findings: Secondary | ICD-10-CM | POA: Diagnosis not present

## 2020-03-01 ENCOUNTER — Ambulatory Visit: Payer: Medicare HMO | Admitting: Hematology

## 2020-03-01 ENCOUNTER — Other Ambulatory Visit: Payer: Medicare HMO

## 2020-04-07 NOTE — Progress Notes (Signed)
Christina Sexton   Telephone:([phone] Fax:(336) [phone]   Clinic Follow up Note   Patient Care Team: Deland Pretty, MD as PCP - General (Internal Medicine) [name], RN as Oncology Nurse Navigator [name], RN as Oncology Nurse Navigator [name], MD as Consulting Physician (General Surgery) [name], MD as Consulting Physician (Hematology) [name], MD as Attending Physician (Radiation Oncology)  Date of Service:  04/12/2020  CHIEF COMPLAINT: F/u of right breast cancer  SUMMARY OF ONCOLOGIC HISTORY: Oncology History Overview Note  Cancer Staging Malignant neoplasm of upper-outer quadrant of right female breast [facility]) Staging form: Breast, AJCC 8th Edition - Clinical stage from 12/09/2018: Stage IIA (cT2, cN0, cM0, G3, ER-, PR-, HER2+) - Signed by [name], MD on 12/15/2018 - Pathologic stage from 05/19/2019: No Stage Recommended (ypT0, pN0, cM0, GX, ER: Not Assessed, PR: Not Assessed, HER2: Not Assessed) - Signed by [name], MD on 06/01/2019    Malignant neoplasm of upper-outer quadrant of right female breast ([place])  12/07/2018 Mammogram   Mammogram 12/07/18  IMPRESSION: 1. 3 cm mass in the 11 o'clock position of the right breast, 4 cm the nipple, measuring 3 x 2 x 2 cm, highly suspicious for breast carcinoma. No enlarged or abnormal right axillary lymph nodes.   12/09/2018 Cancer Staging   Staging form: Breast, AJCC 8th Edition - Clinical stage from 12/09/2018: Stage IIA (cT2, cN0, cM0, G3, ER-, PR-, HER2+) - Signed by [name], MD on 12/15/2018   12/09/2018 Initial Biopsy   Diagnosis 12/09/18 Breast, right, needle core biopsy, 11 o'clock - INVASIVE DUCTAL CARCINOMA. - DUCTAL CARCINOMA IN SITU. - SEE COMMENT.   12/09/2018 Receptors her2   The tumor cells are POSITIVE for Her2 (3+). Estrogen Receptor: 0%, NEGATIVE Progesterone Receptor: 0%, NEGATIVE Proliferation Marker Ki67: 30%   12/15/2018 Initial Diagnosis   Malignant neoplasm of  upper-outer quadrant of right female breast ([place])   12/21/2018 Breast MRI   IMPRESSION: 1. Approximate 4 cm mass involving the UPPER RIGHT breast with extension into the UPPER INNER QUADRANT and UPPER OUTER QUADRANT, predominantly located in the [place], biopsy-proven invasive ductal carcinoma and DCIS. The mass abuts the chest wall though I cannot confirm pectoralis muscle invasion. 2. Superficial satellite mass versus pathologic intramammary lymph node in the UPPER OUTER QUADRANT of the RIGHT breast measuring approximately 1 cm, located LATERAL to the dominant mass. 3. No MRI evidence of malignancy involving the LEFT breast. 4. No pathologic lymphadenopathy otherwise.   12/24/2018 Pathology Results   Diagnosis Breast, right, needle core biopsy, upper outer quadrant, peripheral to the recently biopsy proven breast carcinoma - INVASIVE DUCTAL CARCINOMA. SEE COMMENT. Results: IMMUNOHISTOCHEMICAL AND MORPHOMETRIC ANALYSIS PERFORMED MANUALLY The tumor cells are POSITIVE for Her2 (3+). Estrogen Receptor: 0%, NEGATIVE Progesterone Receptor: 0%, NEGATIVE Proliferation Marker Ki67: 40%   01/06/2019 - 10/25/2019 Chemotherapy   Neoadjuvant chemotherapy TCHP beginning 01/06/19 completed 5 cycle of TCHP on 03/30/19.  She started maintenance Herceptin on 05/04/19. D/c Perjeta after 05/04/19 due to low benefit. Stopped Herceptin after 10/25/19 due to patient request.    04/15/2019 Breast MRI   IMPRESSION: 1. Interval resolution of the 2 enhancing masses corresponding with biopsy proven malignancy in the upper right breast. There is no residual or new, suspicious enhancement. 2. No MRI evidence of malignancy in the left breast.   04/15/2019 Echocardiogram   FINDINGS  Left Ventricle: Left ventricular ejection fraction, by visual estimation, is 55 to 60%. The left ventricle has normal function. There  is no left ventricular hypertrophy. Normal left ventricular size. Spectral Doppler shows Left  ventricular diastolic  parameters were normal pattern of LV diastolic filling. Compared with the echo 7/37/10, systolic function is slightly worse, especially in the anteroseptum. However overall strain is slightly better.     05/19/2019 Surgery   BREAST LUMPECTOMY WITH RADIOACTIVE SEED X2  AND SENTINEL LYMPH NODE BIOPSY by Dr. Barry Dienes  05/19/19    05/19/2019 Pathology Results   FINAL MICROSCOPIC DIAGNOSIS:   A. BREAST, RIGHT LATERAL SATELLITE LESION, LUMPECTOMY:  - No residual invasive carcinoma status post neoadjuvant treatment.  - One intraparenchymal lymph node, negative for carcinoma (0/1).  - See oncology table.   B. BREAST, RIGHT DOMINANT MASS, LUMPECTOMY:  - No residual invasive carcinoma status post neoadjuvant treatment.  - Biopsy site changes.  - See oncology table.   C. SENTINEL LYMPH NODE, RIGHT AXILLARY, EXCISION:  - One lymph node, negative for carcinoma (0/1).   D. SENTINEL LYMPH NODE, RIGHT AXILLARY, EXCISION:  - One lymph node, negative for carcinoma (0/1).   E. SENTINEL LYMPH NODE, RIGHT AXILLARY, EXCISION:  - One lymph node, negative for carcinoma (0/1).   F. SENTINEL LYMPH NODE, RIGHT AXILLARY, EXCISION:  - One lymph node, negative for carcinoma (0/1).    05/19/2019 Cancer Staging   Staging form: Breast, AJCC 8th Edition - Pathologic stage from 05/19/2019: No Stage Recommended (ypT0, pN0, cM0, GX, ER: Not Assessed, PR: Not Assessed, HER2: Not Assessed) - Signed by [name], MD on 06/01/2019   06/16/2019 - 07/15/2019 Radiation Therapy   Adjuvant Radiation with Dr. Isidore Moos  06/16/2019 through 07/15/2019      CURRENT THERAPY:  Surveillance   INTERVAL HISTORY:  Christina Sexton is here for a follow up. She was last seen by me 6 months ago. She presents to the clinic alone. She notes she is doing well. She feels she recovered well from prior treatment. She notes she has residual minimal tingling at tips of fingers, but manageable. She notes back pain only  with heavy lifting. She notes her energy is adequate. She notes she is eating well but her weight is still low, but not underweight. She is very active with walking and biking. She notes she will wait on Flu vaccine as she plans to proceed with shingles vaccine next week.    REVIEW OF SYSTEMS:   Constitutional: Denies fevers, chills or abnormal weight loss Eyes: Denies blurriness of vision Ears, nose, mouth, throat, and face: Denies mucositis or sore throat Respiratory: Denies cough, dyspnea or wheezes Cardiovascular: Denies palpitation, chest discomfort or lower extremity swelling Gastrointestinal:  Denies nausea, heartburn or change in bowel habits Skin: Denies abnormal skin rashes Lymphatics: Denies new lymphadenopathy or easy bruising Neurological:Denies numbness, tingling or new weaknesses Behavioral/Psych: Mood is stable, no new changes  All other systems were reviewed with the patient and are negative.  MEDICAL HISTORY:  Past Medical History:  Diagnosis Date  . Allergy   . Anemia   . Asthma    as a child   . Cancer ([place])    Breast-R  . Chronic kidney disease   . Heart murmur    MVP  . History of kidney stones     x 3 passed 1  . History of radiation therapy 06/16/19- 07/15/19   Right Breast 15 fractions of 2.67 Gy to total 40.05 Gy. Right Breast boost 5 fractions of 2 Gy to total 10 Gy  . Hyperlipidemia   . Hypertension   . Kidney stone   .  Mitral valve prolapse   . Osteoporosis     SURGICAL HISTORY: Past Surgical History:  Procedure Laterality Date  . BREAST BIOPSY Left 12/27/2015   benign  . BREAST BIOPSY Right 07/26/2013   benign  . BREAST EXCISIONAL BIOPSY Right 09/08/2013   high risk   . BREAST LUMPECTOMY WITH RADIOACTIVE SEED AND SENTINEL LYMPH NODE BIOPSY Right 05/19/2019   Procedure: BREAST LUMPECTOMY WITH RADIOACTIVE SEED X2  AND SENTINEL LYMPH NODE BIOPSY;  Surgeon: [name], MD;  Location: [facility];  Service: General;  Laterality: Right;  .  COLONOSCOPY W/ POLYPECTOMY    . CYSTOSCOPY    . PORT-A-CATH REMOVAL Left 12/07/2019   Procedure: REMOVAL PORT-A-CATH;  Surgeon: [name], MD;  Location: [facility];  Service: General;  Laterality: Left;  . PORTACATH PLACEMENT N/A 12/28/2018   Procedure: INSERTION PORT-A-CATH;  Surgeon: [name], MD;  Location: [facility];  Service: General;  Laterality: N/A;  . SHOULDER SURGERY Right    frozen shoulder and bone spur    I have reviewed the social history and family history with the patient and they are unchanged from previous note.  ALLERGIES:  is allergic to oxycodone.  MEDICATIONS:  Current Outpatient Medications  Medication Sig Dispense Refill  . atorvastatin (LIPITOR) 20 MG tablet Take 20 mg by mouth at bedtime.     . diphenhydrAMINE (BENADRYL) 25 MG tablet Take 25 mg by mouth daily as needed for allergies.    . pantoprazole (PROTONIX) 20 MG tablet TAKE 1 TABLET BY MOUTH EVERY DAY 30 tablet 1  . potassium chloride SA (KLOR-CON) 20 MEQ tablet Take 1 tablet (20 mEq total) by mouth every other day. (Patient not taking: Reported on 11/24/2019) 30 tablet 1   Current Facility-Administered Medications  Medication Dose Route Frequency Provider Last Rate Last Admin  . 0.9 %  sodium chloride infusion  500 mL Intravenous Once Nandigam, Kavitha V, MD        PHYSICAL EXAMINATION: ECOG PERFORMANCE STATUS: 0 - Asymptomatic  Vitals:   04/12/20 1100  BP: 135/75  Pulse: 68  Resp: 18  Temp: 99.4 F (37.4 C)  SpO2: 99%   Filed Weights   04/12/20 1100  Weight: 97 lb 1.6 oz (44 kg)    GENERAL:alert, no distress and comfortable SKIN: skin color, texture, turgor are normal, no rashes (+) Mild left breast skin lesion EYES: normal, Conjunctiva are pink and non-injected, sclera clear  NECK: supple, thyroid normal size, non-tender, without nodularity LYMPH:  no palpable lymphadenopathy in the cervical, axillary  LUNGS: clear to auscultation and percussion with normal breathing effort HEART:  regular rate & rhythm and no murmurs and no lower extremity edema ABDOMEN:abdomen soft, non-tender and normal bowel sounds Musculoskeletal:no cyanosis of digits and no clubbing  NEURO: alert & oriented x 3 with fluent speech, no focal motor/sensory deficits BREAST: S/p right lumpectomy: Surgical incision healed well with tenderness. No palpable mass, nodules or adenopathy bilaterally. Breast exam benign.   LABORATORY DATA:  I have reviewed the data as listed CBC Latest Ref Rng & Units 04/12/2020 10/27/2019 09/15/2019  WBC 4.0 - 10.5 K/uL 3.5(L) 3.1(L) 3.3(L)  Hemoglobin 12.0 - 15.0 g/dL 13.2 12.2 13.5  Hematocrit 36 - 46 % 39.5 38.0 40.8  Platelets 150 - 400 K/uL 162 147(L) 161     CMP Latest Ref Rng & Units 04/12/2020 10/27/2019 09/15/2019  Glucose 70 - 99 mg/dL 94 94 103(H)  BUN 8 - 23 mg/dL $Remove'13 15 14  'fAtObjl$ Creatinine 0.44 - 1.00 mg/dL 0.81 0.80  0.93  Sodium 135 - 145 mmol/L 142 141 142  Potassium 3.5 - 5.1 mmol/L 3.7 3.8 3.6  Chloride 98 - 111 mmol/L 104 107 104  CO2 22 - 32 mmol/L 32 28 28  Calcium 8.9 - 10.3 mg/dL 9.7 9.2 9.2  Total Protein 6.5 - 8.1 g/dL 7.1 6.6 7.2  Total Bilirubin 0.3 - 1.2 mg/dL 0.8 0.7 1.0  Alkaline Phos 38 - 126 U/L 52 57 62  AST 15 - 41 U/L $Remo'22 22 21  'khbPX$ ALT 0 - 44 U/L $Remo'17 19 19      'IqdlD$ RADIOGRAPHIC STUDIES: I have personally reviewed the radiological images as listed and agreed with the findings in the report. No results found.   ASSESSMENT & PLAN:  ILKA LOVICK is a 69 y.o. 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  She underwent right lumpectomy by Dr. Barry Dienes on 05/19/19 and completed adjuvant radiation. She is currently on surveillance.  -She is clinically doing well. She has  recovered well from prior treatment. She has residual minimal tingling at fingertips. Lab reviewed, her CBC and CMP are within normal limits except WBC 3.5. Her physical exam and her 11/2019 mammogram were unremarkable. There is no clinical concern for recurrence. -Continue surveillance. I reviewed concerning symptoms to watch for. I recommend she take 1000-2000 units Vit D and Calcium about $RemoveBefor'600mg'[id number]$  daily. I encouraged her to continue age appropriate cancer screenings. Last colonoscopy in 12/2017.  -F/u in 4 months   2. Weight Loss  -She initially had weight loss from breast cancer and treatment.  -Since completing treatment she has not gained much weight back. -She is eating adequately and very active with walking and biking. She can use nutritional supplements to help weight gain.   3. Bone health  -She has not had a bone density scan for many years, I encouraged her to get one but she declined -I encouraged her to take vitamin D and calcium, and weightbearing exercise to stretch her bone  4. Socialand financialsupport  -She is single, lives alone and has no children. She lives 1-2 miles from cancer center.  -She notes she has friends but overall not much family support. -She has medicareand on social security as her only income.   PLAN: -Lab and F/u in 4 months     No problem-specific Assessment & Plan notes found for this encounter.   No orders of the defined types were placed in this encounter.  All questions were answered. The patient knows to call the clinic with any problems, questions or concerns. No barriers to learning was detected. The total time spent in the appointment was 25 minutes.     [name], MD 04/12/2020   I, Joslyn Devon, am acting as scribe for [name], MD.   I have reviewed the above documentation for accuracy and completeness, and I agree with the above.

## 2020-04-11 ENCOUNTER — Other Ambulatory Visit: Payer: Self-pay

## 2020-04-11 DIAGNOSIS — C50411 Malignant neoplasm of upper-outer quadrant of right female breast: Secondary | ICD-10-CM

## 2020-04-12 ENCOUNTER — Encounter: Payer: Self-pay | Admitting: Hematology

## 2020-04-12 ENCOUNTER — Inpatient Hospital Stay: Payer: Medicare HMO | Attending: Hematology

## 2020-04-12 ENCOUNTER — Inpatient Hospital Stay (HOSPITAL_BASED_OUTPATIENT_CLINIC_OR_DEPARTMENT_OTHER): Payer: Medicare HMO | Admitting: Hematology

## 2020-04-12 ENCOUNTER — Other Ambulatory Visit: Payer: Self-pay

## 2020-04-12 VITALS — BP 135/75 | HR 68 | Temp 99.4°F | Resp 18 | Ht 63.0 in | Wt 97.1 lb

## 2020-04-12 DIAGNOSIS — Z923 Personal history of irradiation: Secondary | ICD-10-CM | POA: Insufficient documentation

## 2020-04-12 DIAGNOSIS — C50411 Malignant neoplasm of upper-outer quadrant of right female breast: Secondary | ICD-10-CM

## 2020-04-12 DIAGNOSIS — Z79899 Other long term (current) drug therapy: Secondary | ICD-10-CM | POA: Diagnosis not present

## 2020-04-12 DIAGNOSIS — Z87442 Personal history of urinary calculi: Secondary | ICD-10-CM | POA: Insufficient documentation

## 2020-04-12 DIAGNOSIS — J45909 Unspecified asthma, uncomplicated: Secondary | ICD-10-CM | POA: Insufficient documentation

## 2020-04-12 DIAGNOSIS — I10 Essential (primary) hypertension: Secondary | ICD-10-CM | POA: Insufficient documentation

## 2020-04-12 DIAGNOSIS — N189 Chronic kidney disease, unspecified: Secondary | ICD-10-CM | POA: Diagnosis not present

## 2020-04-12 DIAGNOSIS — Z171 Estrogen receptor negative status [ER-]: Secondary | ICD-10-CM

## 2020-04-12 DIAGNOSIS — E785 Hyperlipidemia, unspecified: Secondary | ICD-10-CM | POA: Insufficient documentation

## 2020-04-12 LAB — CMP (CANCER CENTER ONLY)
ALT: 17 U/L (ref 0–44)
AST: 22 U/L (ref 15–41)
Albumin: 4.2 g/dL (ref 3.5–5.0)
Alkaline Phosphatase: 52 U/L (ref 38–126)
Anion gap: 6 (ref 5–15)
BUN: 13 mg/dL (ref 8–23)
CO2: 32 mmol/L (ref 22–32)
Calcium: 9.7 mg/dL (ref 8.9–10.3)
Chloride: 104 mmol/L (ref 98–111)
Creatinine: 0.81 mg/dL (ref 0.44–1.00)
GFR, Estimated: >60 mL/min (ref >60)
Glucose, Bld: 94 mg/dL (ref 70–99)
Potassium: 3.7 mmol/L (ref 3.5–5.1)
Sodium: 142 mmol/L (ref 135–145)
Total Bilirubin: 0.8 mg/dL (ref 0.3–1.2)
Total Protein: 7.1 g/dL (ref 6.5–8.1)

## 2020-04-12 LAB — CBC WITH DIFFERENTIAL (CANCER CENTER ONLY)
Abs Immature Granulocytes: 0.01 10*3/uL (ref 0.00–0.07)
Basophils Absolute: 0 10*3/uL (ref 0.0–0.1)
Basophils Relative: 1 %
Eosinophils Absolute: 0 10*3/uL (ref 0.0–0.5)
Eosinophils Relative: 1 %
HCT: 39.5 % (ref 36.0–46.0)
Hemoglobin: 13.2 g/dL (ref 12.0–15.0)
Immature Granulocytes: 0 %
Lymphocytes Relative: 28 %
Lymphs Abs: 1 10*3/uL (ref 0.7–4.0)
MCH: 31 pg (ref 26.0–34.0)
MCHC: 33.4 g/dL (ref 30.0–36.0)
MCV: 92.7 fL (ref 80.0–100.0)
Monocytes Absolute: 0.4 10*3/uL (ref 0.1–1.0)
Monocytes Relative: 12 %
Neutro Abs: 2 10*3/uL (ref 1.7–7.7)
Neutrophils Relative %: 58 %
Platelet Count: 162 10*3/uL (ref 150–400)
RBC: 4.26 MIL/uL (ref 3.87–5.11)
RDW: 12.6 % (ref 11.5–15.5)
WBC Count: 3.5 10*3/uL — ABNORMAL LOW (ref 4.0–10.5)
nRBC: 0 % (ref 0.0–0.2)

## 2020-04-13 ENCOUNTER — Telehealth: Payer: Self-pay | Admitting: Hematology

## 2020-04-13 ENCOUNTER — Telehealth: Payer: Self-pay | Admitting: Nurse Practitioner

## 2020-04-13 NOTE — Telephone Encounter (Signed)
Pt requested to move appts on 2/14 to a month later. Messaged RN Santiago Glad to be aware.

## 2020-04-13 NOTE — Telephone Encounter (Signed)
Scheduled per 10/13 los. Unable to reach pt. Left voicemail with appt times and date

## 2020-08-14 ENCOUNTER — Other Ambulatory Visit: Payer: Medicare HMO

## 2020-08-14 ENCOUNTER — Ambulatory Visit: Payer: Medicare HMO | Admitting: Hematology

## 2020-09-08 NOTE — Progress Notes (Signed)
[place]   [phone] Fax:([phone]) [phone]   Clinic Follow up Note   Patient Care Team: Deland Pretty, MD as PCP - General (Internal Medicine) [name], RN as Oncology Nurse Navigator [name], RN as Oncology Nurse Navigator [name], MD as Consulting Physician (General Surgery) [name], MD as Consulting Physician (Hematology) [name], MD as Attending Physician (Radiation Oncology)  Date of Service:  09/13/2020  CHIEF COMPLAINT:  F/u of right breast cancer  SUMMARY OF ONCOLOGIC HISTORY: Oncology History Overview Note  Cancer Staging Malignant neoplasm of upper-outer quadrant of right female breast [facility]) Staging form: Breast, AJCC 8th Edition - Clinical stage from 12/09/2018: Stage IIA (cT2, cN0, cM0, G3, ER-, PR-, HER2+) - Signed by [name], MD on 12/15/2018 - Pathologic stage from 05/19/2019: No Stage Recommended (ypT0, pN0, cM0, GX, ER: Not Assessed, PR: Not Assessed, HER2: Not Assessed) - Signed by [name], MD on 06/01/2019    Malignant neoplasm of upper-outer quadrant of right female breast ([place])  12/07/2018 Mammogram   Mammogram 12/07/18  IMPRESSION: 1. 3 cm mass in the 11 o'clock position of the right breast, 4 cm the nipple, measuring 3 x 2 x 2 cm, highly suspicious for breast carcinoma. No enlarged or abnormal right axillary lymph nodes.   12/09/2018 Cancer Staging   Staging form: Breast, AJCC 8th Edition - Clinical stage from 12/09/2018: Stage IIA (cT2, cN0, cM0, G3, ER-, PR-, HER2+) - Signed by [name], MD on 12/15/2018   12/09/2018 Initial Biopsy   Diagnosis 12/09/18 Breast, right, needle core biopsy, 11 o'clock - INVASIVE DUCTAL CARCINOMA. - DUCTAL CARCINOMA IN SITU. - SEE COMMENT.   12/09/2018 Receptors her2   The tumor cells are POSITIVE for Her2 (3+). Estrogen Receptor: 0%, NEGATIVE Progesterone Receptor: 0%, NEGATIVE Proliferation Marker Ki67: 30%   12/15/2018 Initial Diagnosis   Malignant neoplasm of  upper-outer quadrant of right female breast ([place])   12/21/2018 Breast MRI   IMPRESSION: 1. Approximate 4 cm mass involving the UPPER RIGHT breast with extension into the UPPER INNER QUADRANT and UPPER OUTER QUADRANT, predominantly located in the [place], biopsy-proven invasive ductal carcinoma and DCIS. The mass abuts the chest wall though I cannot confirm pectoralis muscle invasion. 2. Superficial satellite mass versus pathologic intramammary lymph node in the UPPER OUTER QUADRANT of the RIGHT breast measuring approximately 1 cm, located LATERAL to the dominant mass. 3. No MRI evidence of malignancy involving the LEFT breast. 4. No pathologic lymphadenopathy otherwise.   12/24/2018 Pathology Results   Diagnosis Breast, right, needle core biopsy, upper outer quadrant, peripheral to the recently biopsy proven breast carcinoma - INVASIVE DUCTAL CARCINOMA. SEE COMMENT. Results: IMMUNOHISTOCHEMICAL AND MORPHOMETRIC ANALYSIS PERFORMED MANUALLY The tumor cells are POSITIVE for Her2 (3+). Estrogen Receptor: 0%, NEGATIVE Progesterone Receptor: 0%, NEGATIVE Proliferation Marker Ki67: 40%   01/06/2019 - 10/25/2019 Chemotherapy   Neoadjuvant chemotherapy TCHP beginning 01/06/19 completed 5 cycle of TCHP on 03/30/19.  She started maintenance Herceptin on 05/04/19. D/c Perjeta after 05/04/19 due to low benefit. Stopped Herceptin after 10/25/19 due to patient request.    04/15/2019 Breast MRI   IMPRESSION: 1. Interval resolution of the 2 enhancing masses corresponding with biopsy proven malignancy in the upper right breast. There is no residual or new, suspicious enhancement. 2. No MRI evidence of malignancy in the left breast.   04/15/2019 Echocardiogram   FINDINGS  Left Ventricle: Left ventricular ejection fraction, by visual estimation, is 55 to 60%. The left ventricle has normal function.  There is no left ventricular hypertrophy. Normal left ventricular size. Spectral Doppler shows Left  ventricular diastolic  parameters were normal pattern of LV diastolic filling. Compared with the echo 4/56/25, systolic function is slightly worse, especially in the anteroseptum. However overall strain is slightly better.     05/19/2019 Surgery   BREAST LUMPECTOMY WITH RADIOACTIVE SEED X2  AND SENTINEL LYMPH NODE BIOPSY by Dr. Barry Dienes  05/19/19    05/19/2019 Pathology Results   FINAL MICROSCOPIC DIAGNOSIS:   A. BREAST, RIGHT LATERAL SATELLITE LESION, LUMPECTOMY:  - No residual invasive carcinoma status post neoadjuvant treatment.  - One intraparenchymal lymph node, negative for carcinoma (0/1).  - See oncology table.   B. BREAST, RIGHT DOMINANT MASS, LUMPECTOMY:  - No residual invasive carcinoma status post neoadjuvant treatment.  - Biopsy site changes.  - See oncology table.   C. SENTINEL LYMPH NODE, RIGHT AXILLARY, EXCISION:  - One lymph node, negative for carcinoma (0/1).   D. SENTINEL LYMPH NODE, RIGHT AXILLARY, EXCISION:  - One lymph node, negative for carcinoma (0/1).   E. SENTINEL LYMPH NODE, RIGHT AXILLARY, EXCISION:  - One lymph node, negative for carcinoma (0/1).   F. SENTINEL LYMPH NODE, RIGHT AXILLARY, EXCISION:  - One lymph node, negative for carcinoma (0/1).    05/19/2019 Cancer Staging   Staging form: Breast, AJCC 8th Edition - Pathologic stage from 05/19/2019: No Stage Recommended (ypT0, pN0, cM0, GX, ER: Not Assessed, PR: Not Assessed, HER2: Not Assessed) - Signed by [name], MD on 06/01/2019   06/16/2019 - 07/15/2019 Radiation Therapy   Adjuvant Radiation with Dr. Isidore Moos  06/16/2019 through 07/15/2019      CURRENT THERAPY:  Surveillance   INTERVAL HISTORY:  Christina Sexton is here for a follow up of right breast cancer. She was last seen by me 5 months ago. She presents to the clinic alone. She notes she is doing well. She notes she is eating her normal amount. She notes her taste has returned. 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 She denies aches or pain.     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  Behavioral/Psych: Mood is stable, no new changes  All other systems were reviewed with the patient and are negative.  MEDICAL HISTORY:  Past Medical History:  Diagnosis Date  . Allergy   . Anemia   . Asthma    as a child   . Cancer ([place])    Breast-R  . Chronic kidney disease   . Heart murmur    MVP  . History of kidney stones     x 3 passed 1  . History of radiation therapy 06/16/19- 07/15/19   Right Breast 15 fractions of 2.67 Gy to total 40.05 Gy. Right Breast boost 5 fractions of 2 Gy to total 10 Gy  . Hyperlipidemia   . Hypertension   . Kidney stone   . Mitral valve prolapse   . Osteoporosis     SURGICAL HISTORY: Past Surgical History:  Procedure Laterality Date  . BREAST BIOPSY Left 12/27/2015   benign  .  BREAST BIOPSY Right 07/26/2013   benign  . BREAST EXCISIONAL BIOPSY Right 09/08/2013   high risk   . BREAST LUMPECTOMY WITH RADIOACTIVE SEED AND SENTINEL LYMPH NODE BIOPSY Right 05/19/2019   Procedure: BREAST LUMPECTOMY WITH RADIOACTIVE SEED X2  AND SENTINEL LYMPH NODE BIOPSY;  Surgeon: [name], MD;  Location: [facility];  Service: General;  Laterality: Right;  . COLONOSCOPY W/ POLYPECTOMY    . CYSTOSCOPY    . PORT-A-CATH REMOVAL Left 12/07/2019   Procedure: REMOVAL PORT-A-CATH;  Surgeon: [name], MD;  Location: [facility];  Service: General;  Laterality: Left;  .  PORTACATH PLACEMENT N/A 12/28/2018   Procedure: INSERTION PORT-A-CATH;  Surgeon: [name], MD;  Location: [facility];  Service: General;  Laterality: N/A;  . SHOULDER SURGERY Right    frozen shoulder and bone spur    I have reviewed the social history and family history with the patient and they are unchanged from previous note.  ALLERGIES:  is allergic to oxycodone.  MEDICATIONS:  Current Outpatient Medications  Medication Sig Dispense Refill  . atorvastatin (LIPITOR) 20 MG tablet Take 20 mg by mouth at bedtime.     . diphenhydrAMINE (BENADRYL) 25 MG tablet Take 25 mg by mouth daily as needed for allergies.    . pantoprazole (PROTONIX) 20 MG tablet TAKE 1 TABLET BY MOUTH EVERY DAY 30 tablet 1  . potassium chloride SA (KLOR-CON) 20 MEQ tablet Take 1 tablet (20 mEq total) by mouth every other day. (Patient not taking: Reported on 11/24/2019) 30 tablet 1   Current Facility-Administered Medications  Medication Dose Route Frequency Provider Last Rate Last Admin  . 0.9 %  sodium chloride infusion  500 mL Intravenous Once Nandigam, Venia Minks, MD        PHYSICAL EXAMINATION: ECOG PERFORMANCE STATUS: 0 - Asymptomatic  Vitals:   09/13/20 1029  BP: (!) 143/63  Pulse: 69  Resp: 13  Temp: 97.8 F (36.6 C)  SpO2: 100%   Filed Weights   09/13/20 1029  Weight: 99 lb 14.4 oz (45.3 kg)    GENERAL:alert, no distress and comfortable SKIN: skin color, texture, turgor are normal, no rashes or significant lesions (+) Skin lesion on chest EYES: normal, Conjunctiva are pink and non-injected, sclera clear  NECK: supple, thyroid normal size, non-tender, without nodularity LYMPH:  no palpable lymphadenopathy in the cervical, axillary  LUNGS: clear to auscultation and percussion with normal breathing effort HEART: regular rate & rhythm and no murmurs and no lower extremity edema ABDOMEN:abdomen soft, non-tender and normal bowel sounds Musculoskeletal:no cyanosis of digits and no clubbing  NEURO:  alert & oriented x 3 with fluent speech, no focal motor/sensory deficits BREAST: S/p right lumpectomy: Surgical incision healed well with mild scar tissue. No palpable mass, nodules or adenopathy bilaterally. Breast exam benign.   LABORATORY DATA:  I have reviewed the data as listed CBC Latest Ref Rng & Units 09/13/2020 04/12/2020 10/27/2019  WBC 4.0 - 10.5 K/uL 4.2 3.5(L) 3.1(L)  Hemoglobin 12.0 - 15.0 g/dL 13.1 13.2 12.2  Hematocrit 36.0 - 46.0 % 40.1 39.5 38.0  Platelets 150 - 400 K/uL 152 162 147(L)     CMP Latest Ref Rng & Units 09/13/2020 04/12/2020 10/27/2019  Glucose 70 - 99 mg/dL 102(H) 94 94  BUN 8 - 23 mg/dL _0 Creatinine 0.44 - 1.00 mg/dL 0.91 0.81 0.80  Sodium 135 - 145 mmol/L 139 142 141  Potassium 3.5 - 5.1 mmol/L 3.6 3.7 3.8  Chloride 98 - 111 mmol/L 104  104 107  CO2 22 - 32 mmol/L 29 32 28  Calcium 8.9 - 10.3 mg/dL 9.3 9.7 9.2  Total Protein 6.5 - 8.1 g/dL 7.0 7.1 6.6  Total Bilirubin 0.3 - 1.2 mg/dL 0.9 0.8 0.7  Alkaline Phos 38 - 126 U/L 54 52 57  AST 15 - 41 U/L _0 ALT 0 - 44 U/L 32 17 19      RADIOGRAPHIC STUDIES: I have personally reviewed the radiological images as listed and agreed with the findings in the report. No results found.   ASSESSMENT & PLAN:  Christina Sexton is a 70 y.o. 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  She underwent right lumpectomy by Dr. Barry Dienes on 05/19/19 and completed adjuvant radiation. She is currently on surveillance.  She is clinically doing well. 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 This is manageable. Lab reviewed, her CBC and CMP are within normal limits  except BG 102. Her physical exam and her 11/2019 mammogram were unremarkable. There is no clinical concern for recurrence. -She is almost 2 years since her cancer diagnosis. Continue surveillance. Next Mammogram in 11/2020.  -F/u in 6 months    2. Weight Loss  -She initially had weight loss from breast cancer and treatment.  -Since completing treatment she has not gained much weight back. -She is eating adequately and very active with walking and biking. She can use nutritional supplements to help weight gain. Weight mostly stable.   3. Bone health  -She has not had a bone density scan for many years, I encouraged her to get one but she declined -I encouraged her to take vitamin D and calcium, and weightbearing exercise to stretch her bone.  4. Socialand financialsupport  -She is single, lives alone and has no children. She [place] 1-2 miles from cancer center.  -She notes she has friends but overall not much family support. -She has medicareand on social security as her only income.  5. Health Maintenance  -I encouraged her to continue age appropriate cancer screenings. Last colonoscopy in 12/2017.    PLAN: -Mammogram in 11/2020.  -Lab and F/u with NP Lacie in 6 months     No problem-specific Assessment & Plan notes found for this encounter.   Orders Placed This Encounter  Procedures  . MM DIAG BREAST TOMO BILATERAL    Standing Status:   Future    Standing Expiration Date:   09/13/2021    Order Specific Question:   Reason for Exam (SYMPTOM  OR DIAGNOSIS REQUIRED)    Answer:   screening    Order Specific Question:   Preferred imaging location?    Answer:   [facility]   All questions were answered. The patient knows to call the clinic with any problems, questions or concerns. No barriers to learning was detected. The total time spent in the appointment was 25 minutes.     [name], MD 09/13/2020   I, Joslyn Devon, am acting as scribe for [name], MD.   I  have reviewed the above documentation for accuracy and completeness, and I agree with the above.

## 2020-09-13 ENCOUNTER — Inpatient Hospital Stay: Payer: Medicare HMO | Attending: Hematology

## 2020-09-13 ENCOUNTER — Encounter: Payer: Self-pay | Admitting: Hematology

## 2020-09-13 ENCOUNTER — Other Ambulatory Visit: Payer: Self-pay

## 2020-09-13 ENCOUNTER — Inpatient Hospital Stay (HOSPITAL_BASED_OUTPATIENT_CLINIC_OR_DEPARTMENT_OTHER): Payer: Medicare HMO | Admitting: Hematology

## 2020-09-13 VITALS — BP 143/63 | HR 69 | Temp 97.8°F | Resp 13 | Ht 63.0 in | Wt 99.9 lb

## 2020-09-13 DIAGNOSIS — C50411 Malignant neoplasm of upper-outer quadrant of right female breast: Secondary | ICD-10-CM

## 2020-09-13 DIAGNOSIS — Z853 Personal history of malignant neoplasm of breast: Secondary | ICD-10-CM | POA: Diagnosis not present

## 2020-09-13 DIAGNOSIS — Z171 Estrogen receptor negative status [ER-]: Secondary | ICD-10-CM

## 2020-09-13 DIAGNOSIS — Z923 Personal history of irradiation: Secondary | ICD-10-CM | POA: Diagnosis not present

## 2020-09-13 DIAGNOSIS — Z9221 Personal history of antineoplastic chemotherapy: Secondary | ICD-10-CM | POA: Insufficient documentation

## 2020-09-13 LAB — CMP (CANCER CENTER ONLY)
ALT: 32 U/L (ref 0–44)
AST: 30 U/L (ref 15–41)
Albumin: 4.4 g/dL (ref 3.5–5.0)
Alkaline Phosphatase: 54 U/L (ref 38–126)
Anion gap: 6 (ref 5–15)
BUN: 14 mg/dL (ref 8–23)
CO2: 29 mmol/L (ref 22–32)
Calcium: 9.3 mg/dL (ref 8.9–10.3)
Chloride: 104 mmol/L (ref 98–111)
Creatinine: 0.91 mg/dL (ref 0.44–1.00)
GFR, Estimated: >60 mL/min (ref >60)
Glucose, Bld: 102 mg/dL — ABNORMAL HIGH (ref 70–99)
Potassium: 3.6 mmol/L (ref 3.5–5.1)
Sodium: 139 mmol/L (ref 135–145)
Total Bilirubin: 0.9 mg/dL (ref 0.3–1.2)
Total Protein: 7 g/dL (ref 6.5–8.1)

## 2020-09-13 LAB — CBC WITH DIFFERENTIAL (CANCER CENTER ONLY)
Abs Immature Granulocytes: 0 10*3/uL (ref 0.00–0.07)
Basophils Absolute: 0 10*3/uL (ref 0.0–0.1)
Basophils Relative: 1 %
Eosinophils Absolute: 0.1 10*3/uL (ref 0.0–0.5)
Eosinophils Relative: 2 %
HCT: 40.1 % (ref 36.0–46.0)
Hemoglobin: 13.1 g/dL (ref 12.0–15.0)
Immature Granulocytes: 0 %
Lymphocytes Relative: 24 %
Lymphs Abs: 1 10*3/uL (ref 0.7–4.0)
MCH: 31 pg (ref 26.0–34.0)
MCHC: 32.7 g/dL (ref 30.0–36.0)
MCV: 94.8 fL (ref 80.0–100.0)
Monocytes Absolute: 0.4 10*3/uL (ref 0.1–1.0)
Monocytes Relative: 9 %
Neutro Abs: 2.7 10*3/uL (ref 1.7–7.7)
Neutrophils Relative %: 64 %
Platelet Count: 152 10*3/uL (ref 150–400)
RBC: 4.23 MIL/uL (ref 3.87–5.11)
RDW: 12.7 % (ref 11.5–15.5)
WBC Count: 4.2 10*3/uL (ref 4.0–10.5)
nRBC: 0 % (ref 0.0–0.2)

## 2020-09-13 IMAGING — MG DIGITAL DIAGNOSTIC BILAT W/ TOMO W/ CAD
9 series · 9 of 25 positions shown · non-contrast
Comparison: Previous exam(s).

CLINICAL DATA: 69-year-old female status post malignant right
lumpectomy in 0202 with radiation therapy. No current symptoms.

EXAM:
DIGITAL DIAGNOSTIC BILATERAL MAMMOGRAM WITH TOMO AND CAD

[R MLO]
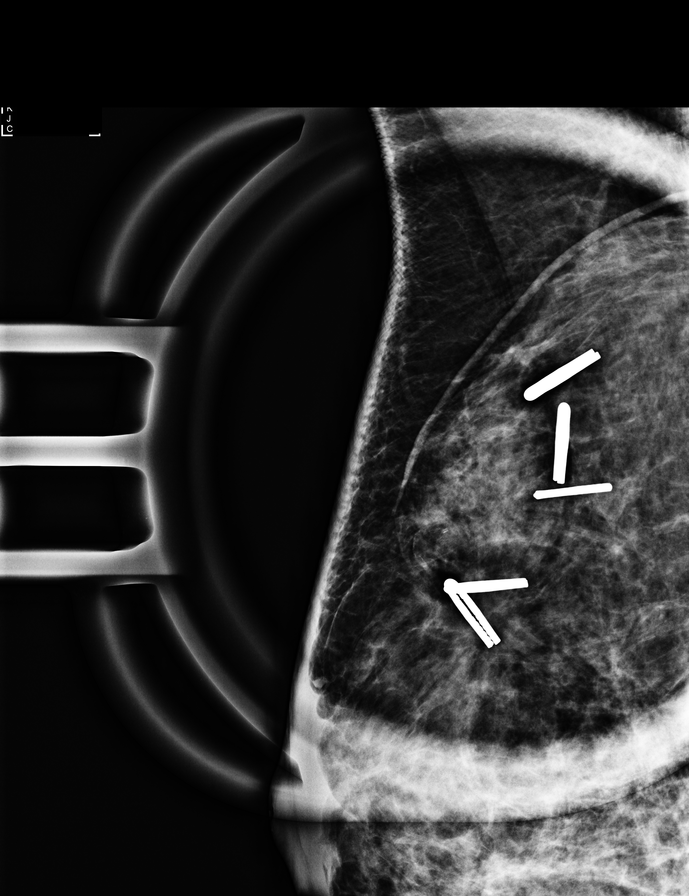

[R MLO synth-2D]
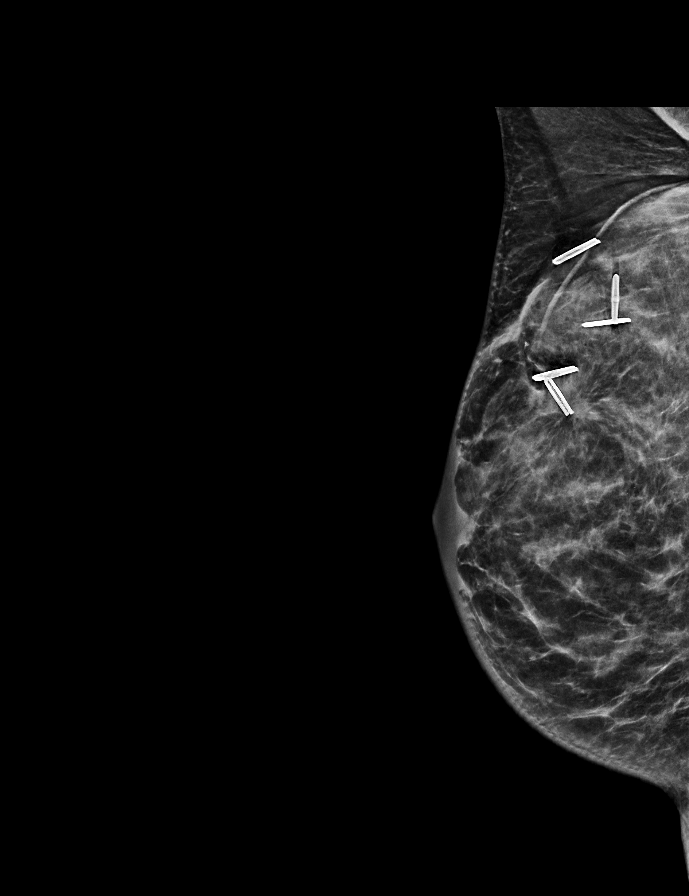

[R CC synth-2D]
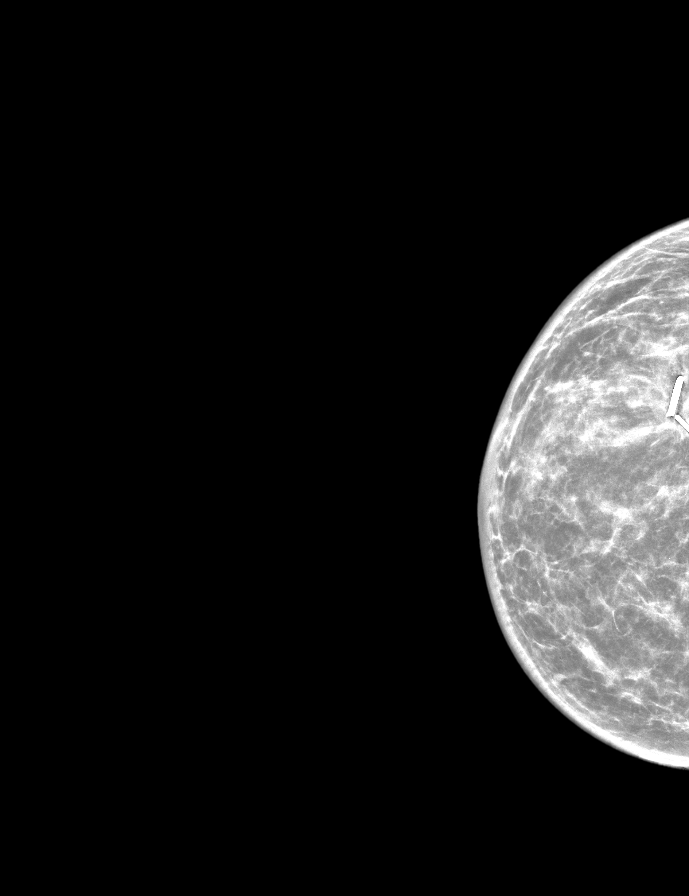

[L CC synth-2D]
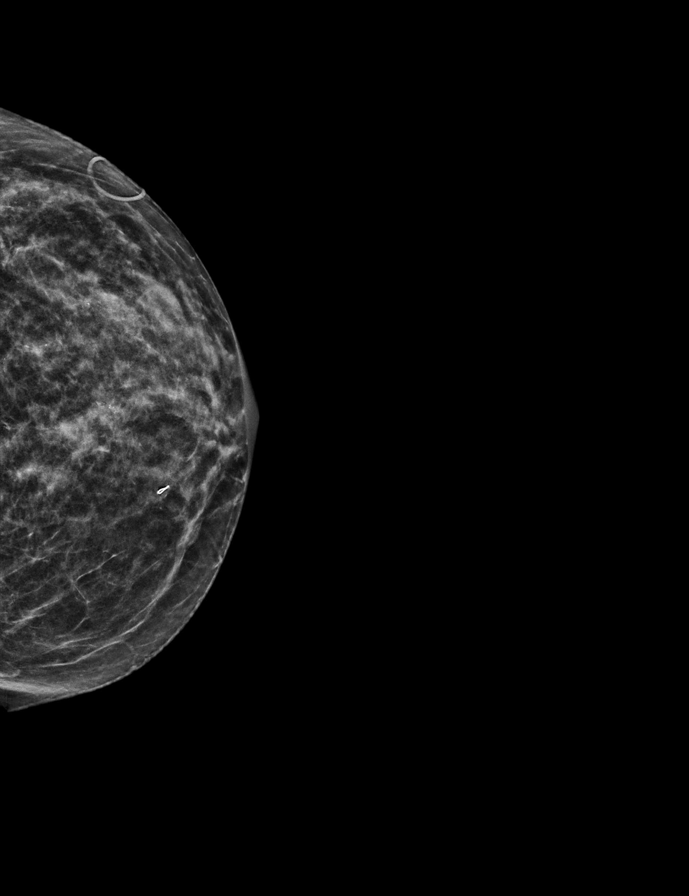

[L MLO synth-2D]
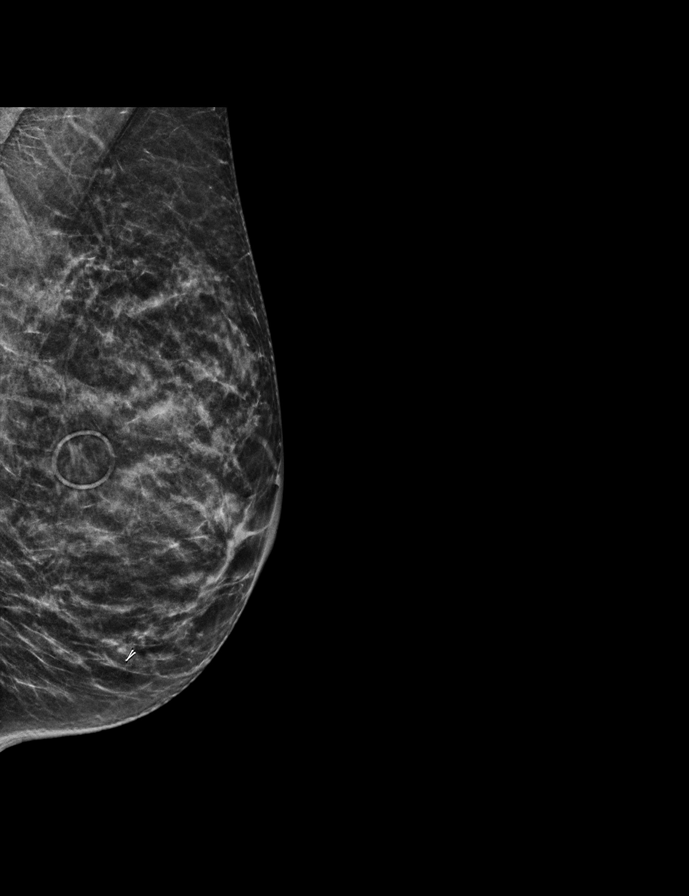

[R MLO tomo · tomo slice 27/53.0]
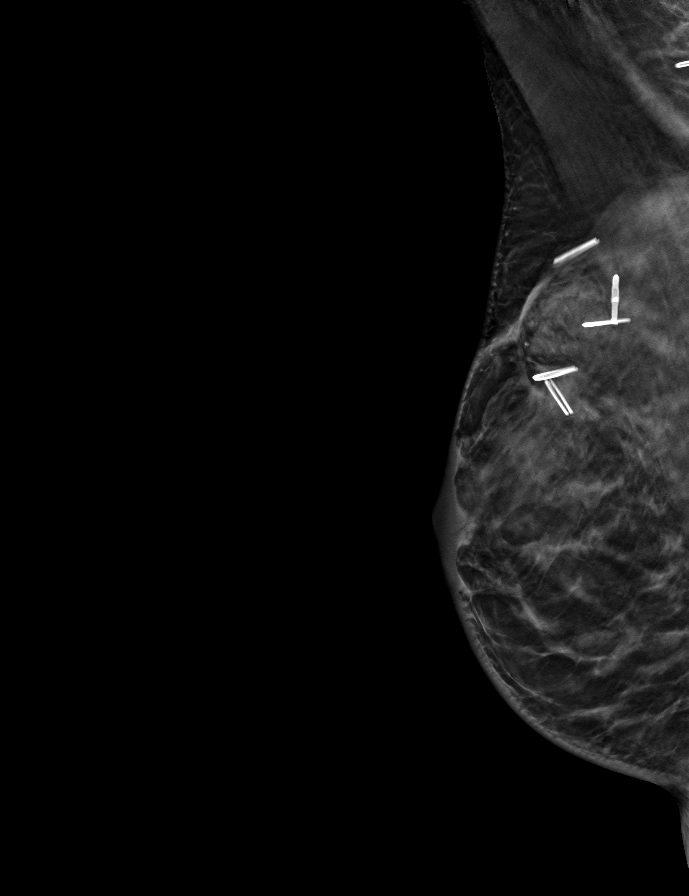

[R CC tomo · tomo slice 26/51.0]
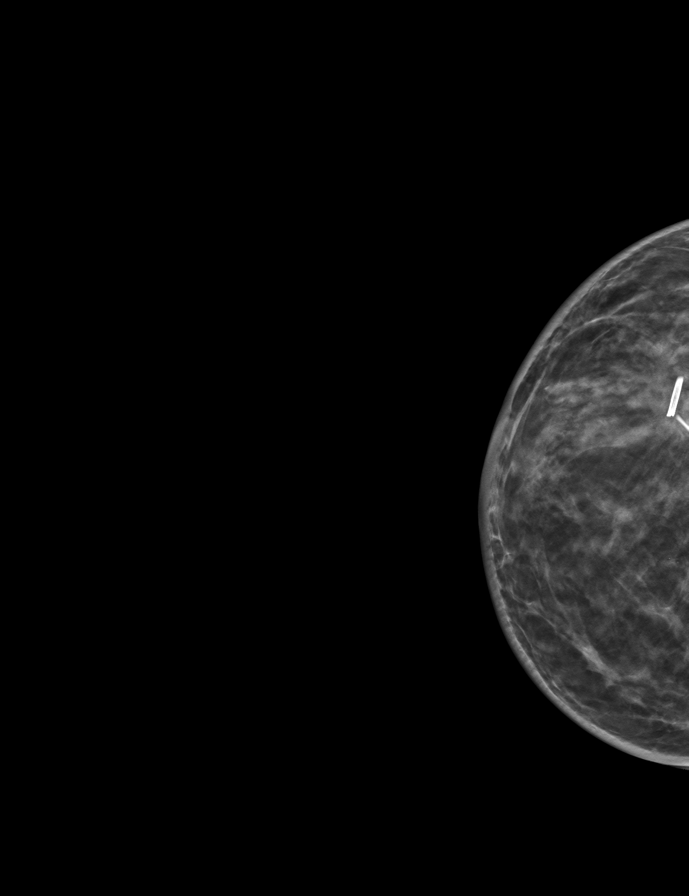

[L CC tomo · tomo slice 22/43.0]
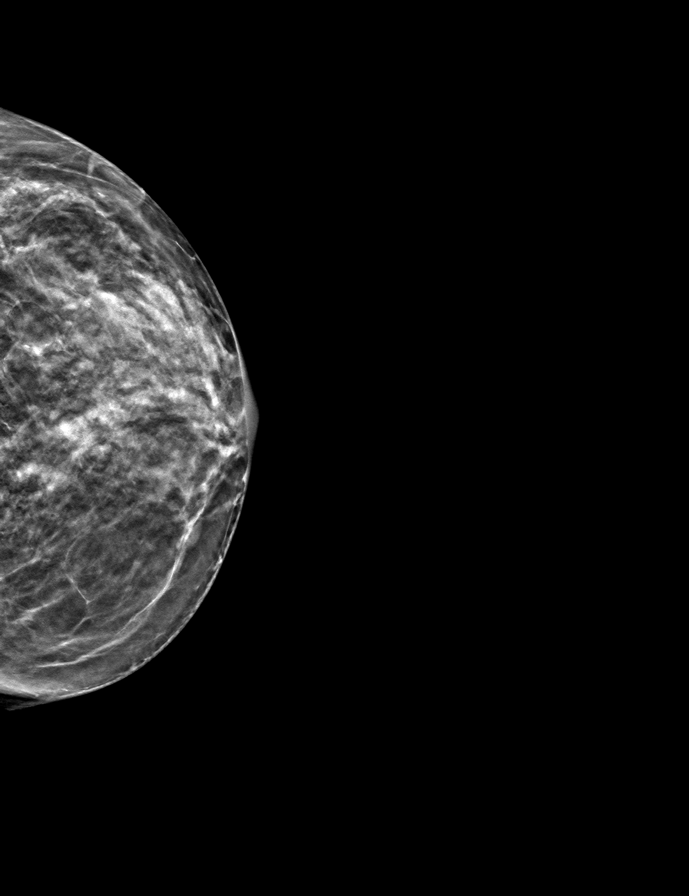

[L MLO tomo · tomo slice 22/43.0]
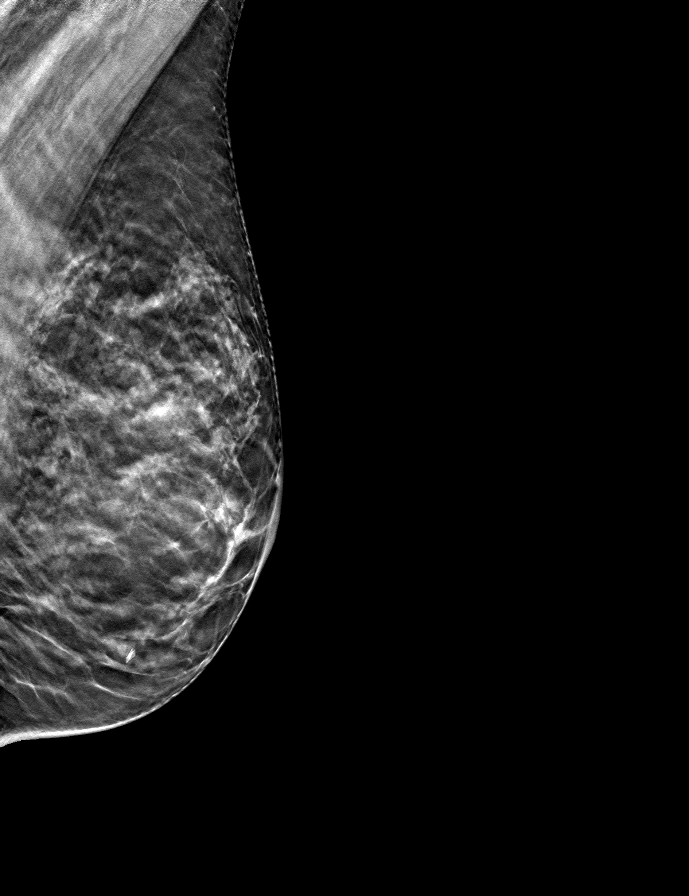

[9 of 25 positions shown; findings below may reference images not displayed]

ACR Breast Density Category c: The breast tissue is heterogeneously
dense, which may obscure small masses.
FINDINGS: Interval post lumpectomy changes are noted in the far upper outer
right breast. Diffuse skin and trabecular thickening is consistent
with radiation related changes. No new or suspicious findings are
identified in either breast.

Mammographic images were processed with CAD.
IMPRESSION: 1. No mammographic evidence of malignancy in either breast.
2. Interval right breast posttreatment changes.

RECOMMENDATION:
Diagnostic mammogram is suggested in 1 year. (Code:[id number])

I have discussed the findings and recommendations with the patient.
If applicable, a reminder letter will be sent to the patient
regarding the next appointment.

BI-RADS CATEGORY  2: Benign.

## 2020-09-15 ENCOUNTER — Telehealth: Payer: Self-pay | Admitting: Hematology

## 2020-09-15 NOTE — Telephone Encounter (Signed)
Scheduled follow-up appointment per 3/16 los. Patient is aware. 

## 2020-12-21 DIAGNOSIS — Z0000 Encounter for general adult medical examination without abnormal findings: Secondary | ICD-10-CM | POA: Diagnosis not present

## 2020-12-28 ENCOUNTER — Ambulatory Visit
Admission: RE | Admit: 2020-12-28 | Discharge: 2020-12-28 | Disposition: A | Discharge status: Home / Self Care | Payer: Medicare HMO | Source: Ambulatory Visit | Attending: Hematology | Admitting: Hematology

## 2020-12-28 ENCOUNTER — Other Ambulatory Visit: Payer: Self-pay

## 2020-12-28 DIAGNOSIS — Z01419 Encounter for gynecological examination (general) (routine) without abnormal findings: Secondary | ICD-10-CM | POA: Diagnosis not present

## 2020-12-28 DIAGNOSIS — C50911 Malignant neoplasm of unspecified site of right female breast: Secondary | ICD-10-CM | POA: Diagnosis not present

## 2020-12-28 DIAGNOSIS — Z9221 Personal history of antineoplastic chemotherapy: Secondary | ICD-10-CM | POA: Diagnosis not present

## 2020-12-28 DIAGNOSIS — Z0000 Encounter for general adult medical examination without abnormal findings: Secondary | ICD-10-CM | POA: Diagnosis not present

## 2020-12-28 DIAGNOSIS — M810 Age-related osteoporosis without current pathological fracture: Secondary | ICD-10-CM | POA: Diagnosis not present

## 2020-12-28 DIAGNOSIS — R922 Inconclusive mammogram: Secondary | ICD-10-CM | POA: Diagnosis not present

## 2020-12-28 DIAGNOSIS — E7800 Pure hypercholesterolemia, unspecified: Secondary | ICD-10-CM | POA: Diagnosis not present

## 2020-12-28 DIAGNOSIS — Z853 Personal history of malignant neoplasm of breast: Secondary | ICD-10-CM | POA: Diagnosis not present

## 2020-12-28 DIAGNOSIS — C50411 Malignant neoplasm of upper-outer quadrant of right female breast: Secondary | ICD-10-CM

## 2020-12-28 DIAGNOSIS — Z8249 Family history of ischemic heart disease and other diseases of the circulatory system: Secondary | ICD-10-CM | POA: Diagnosis not present

## 2020-12-28 DIAGNOSIS — Z171 Estrogen receptor negative status [ER-]: Secondary | ICD-10-CM

## 2021-03-15 ENCOUNTER — Inpatient Hospital Stay: Payer: Medicare HMO

## 2021-03-15 ENCOUNTER — Other Ambulatory Visit: Payer: Self-pay

## 2021-03-15 ENCOUNTER — Encounter: Payer: Self-pay | Admitting: Nurse Practitioner

## 2021-03-15 ENCOUNTER — Inpatient Hospital Stay: Payer: Medicare HMO | Attending: Nurse Practitioner | Admitting: Nurse Practitioner

## 2021-03-15 VITALS — BP 145/81 | HR 69 | Resp 18 | Ht 63.0 in | Wt 99.0 lb

## 2021-03-15 DIAGNOSIS — I10 Essential (primary) hypertension: Secondary | ICD-10-CM | POA: Insufficient documentation

## 2021-03-15 DIAGNOSIS — Z923 Personal history of irradiation: Secondary | ICD-10-CM | POA: Insufficient documentation

## 2021-03-15 DIAGNOSIS — Z79899 Other long term (current) drug therapy: Secondary | ICD-10-CM | POA: Diagnosis not present

## 2021-03-15 DIAGNOSIS — E785 Hyperlipidemia, unspecified: Secondary | ICD-10-CM | POA: Insufficient documentation

## 2021-03-15 DIAGNOSIS — Z171 Estrogen receptor negative status [ER-]: Secondary | ICD-10-CM | POA: Diagnosis not present

## 2021-03-15 DIAGNOSIS — Z853 Personal history of malignant neoplasm of breast: Secondary | ICD-10-CM | POA: Insufficient documentation

## 2021-03-15 DIAGNOSIS — C50411 Malignant neoplasm of upper-outer quadrant of right female breast: Secondary | ICD-10-CM

## 2021-03-15 DIAGNOSIS — Z9221 Personal history of antineoplastic chemotherapy: Secondary | ICD-10-CM | POA: Insufficient documentation

## 2021-03-15 LAB — CBC WITH DIFFERENTIAL (CANCER CENTER ONLY)
Abs Immature Granulocytes: 0 10*3/uL (ref 0.00–0.07)
Basophils Absolute: 0 10*3/uL (ref 0.0–0.1)
Basophils Relative: 1 %
Eosinophils Absolute: 0.1 10*3/uL (ref 0.0–0.5)
Eosinophils Relative: 2 %
HCT: 40.6 % (ref 36.0–46.0)
Hemoglobin: 13.2 g/dL (ref 12.0–15.0)
Immature Granulocytes: 0 %
Lymphocytes Relative: 23 %
Lymphs Abs: 0.8 10*3/uL (ref 0.7–4.0)
MCH: 30.5 pg (ref 26.0–34.0)
MCHC: 32.5 g/dL (ref 30.0–36.0)
MCV: 93.8 fL (ref 80.0–100.0)
Monocytes Absolute: 0.4 10*3/uL (ref 0.1–1.0)
Monocytes Relative: 12 %
Neutro Abs: 2.1 10*3/uL (ref 1.7–7.7)
Neutrophils Relative %: 62 %
Platelet Count: 150 10*3/uL (ref 150–400)
RBC: 4.33 MIL/uL (ref 3.87–5.11)
RDW: 12.9 % (ref 11.5–15.5)
WBC Count: 3.4 10*3/uL — ABNORMAL LOW (ref 4.0–10.5)
nRBC: 0 % (ref 0.0–0.2)

## 2021-03-15 LAB — CMP (CANCER CENTER ONLY)
ALT: 21 U/L (ref 0–44)
AST: 24 U/L (ref 15–41)
Albumin: 4.4 g/dL (ref 3.5–5.0)
Alkaline Phosphatase: 58 U/L (ref 38–126)
Anion gap: 9 (ref 5–15)
BUN: 12 mg/dL (ref 8–23)
CO2: 28 mmol/L (ref 22–32)
Calcium: 9.6 mg/dL (ref 8.9–10.3)
Chloride: 104 mmol/L (ref 98–111)
Creatinine: 0.91 mg/dL (ref 0.44–1.00)
GFR, Estimated: >60 mL/min (ref >60)
Glucose, Bld: 99 mg/dL (ref 70–99)
Potassium: 3.7 mmol/L (ref 3.5–5.1)
Sodium: 141 mmol/L (ref 135–145)
Total Bilirubin: 1.2 mg/dL (ref 0.3–1.2)
Total Protein: 6.9 g/dL (ref 6.5–8.1)

## 2021-03-15 NOTE — Progress Notes (Signed)
[place]   Telephone:(336) [phone] Fax:(336) [phone]   Clinic Follow up Note   Patient Care Team: Christina Pretty, MD as PCP - General (Internal Medicine) Christina Kaufmann, RN as Oncology Nurse Navigator Christina Germany, RN as Oncology Nurse Navigator Christina Klein, MD as Consulting Physician (General Surgery) Christina Merle, MD as Consulting Physician (Hematology) Christina Gibson, MD as Attending Physician (Radiation Oncology) 03/15/2021  CHIEF COMPLAINT: Follow up right breast cancer   SUMMARY OF ONCOLOGIC HISTORY: Oncology History Overview Note  Cancer Staging Malignant neoplasm of upper-outer quadrant of right female breast [facility]) Staging form: Breast, AJCC 8th Edition - Clinical stage from 12/09/2018: Stage IIA (cT2, cN0, cM0, G3, ER-, PR-, HER2+) - Signed by Christina Merle, MD on 12/15/2018 - Pathologic stage from 05/19/2019: No Stage Recommended (ypT0, pN0, cM0, GX, ER: Not Assessed, PR: Not Assessed, HER2: Not Assessed) - Signed by Christina Merle, MD on 06/01/2019    Malignant neoplasm of upper-outer quadrant of right female breast ([place])  12/07/2018 Mammogram   Mammogram 12/07/18  IMPRESSION: 1. 3 cm mass in the 11 o'clock position of the right breast, 4 cm the nipple, measuring 3 x 2 x 2 cm, highly suspicious for breast carcinoma. No enlarged or abnormal right axillary lymph nodes.   12/09/2018 Cancer Staging   Staging form: Breast, AJCC 8th Edition - Clinical stage from 12/09/2018: Stage IIA (cT2, cN0, cM0, G3, ER-, PR-, HER2+) - Signed by Christina Merle, MD on 12/15/2018   12/09/2018 Initial Biopsy   Diagnosis 12/09/18 Breast, right, needle core biopsy, 11 o'clock - INVASIVE DUCTAL CARCINOMA. - DUCTAL CARCINOMA IN SITU. - SEE COMMENT.   12/09/2018 Receptors her2   The tumor cells are POSITIVE for Her2 (3+). Estrogen Receptor: 0%, NEGATIVE Progesterone Receptor: 0%, NEGATIVE Proliferation Marker Ki67: 30%   12/15/2018 Initial Diagnosis   Malignant neoplasm of upper-outer  quadrant of right female breast ([place])   12/21/2018 Breast MRI   IMPRESSION: 1. Approximate 4 cm mass involving the UPPER RIGHT breast with extension into the UPPER INNER QUADRANT and UPPER OUTER QUADRANT, predominantly located in the [place], biopsy-proven invasive ductal carcinoma and DCIS. The mass abuts the chest wall though I cannot confirm pectoralis muscle invasion. 2. Superficial satellite mass versus pathologic intramammary lymph node in the UPPER OUTER QUADRANT of the RIGHT breast measuring approximately 1 cm, located LATERAL to the dominant mass. 3. No MRI evidence of malignancy involving the LEFT breast. 4. No pathologic lymphadenopathy otherwise.   12/24/2018 Pathology Results   Diagnosis Breast, right, needle core biopsy, upper outer quadrant, peripheral to the recently biopsy proven breast carcinoma - INVASIVE DUCTAL CARCINOMA. SEE COMMENT. Results: IMMUNOHISTOCHEMICAL AND MORPHOMETRIC ANALYSIS PERFORMED MANUALLY The tumor cells are POSITIVE for Her2 (3+). Estrogen Receptor: 0%, NEGATIVE Progesterone Receptor: 0%, NEGATIVE Proliferation Marker Ki67: 40%   01/06/2019 - 10/25/2019 Chemotherapy   Neoadjuvant chemotherapy TCHP beginning 01/06/19 completed 5 cycle of TCHP on 03/30/19.  She started maintenance Herceptin on 05/04/19. D/c Perjeta after 05/04/19 due to low benefit. Stopped Herceptin after 10/25/19 due to patient request.    04/15/2019 Breast MRI   IMPRESSION: 1. Interval resolution of the 2 enhancing masses corresponding with biopsy proven malignancy in the upper right breast. There is no residual or new, suspicious enhancement. 2. No MRI evidence of malignancy in the left breast.   04/15/2019 Echocardiogram   FINDINGS  Left Ventricle: Left ventricular ejection fraction, by visual estimation, is 55 to 60%. The left ventricle has normal function. There is no left ventricular  hypertrophy. Normal left ventricular size. Spectral Doppler shows Left ventricular  diastolic  parameters were normal pattern of LV diastolic filling. Compared with the echo 01/17/93, systolic function is slightly worse, especially in the anteroseptum. However overall strain is slightly better.     05/19/2019 Surgery   BREAST LUMPECTOMY WITH RADIOACTIVE SEED X2  AND SENTINEL LYMPH NODE BIOPSY by Christina Sexton  05/19/19    05/19/2019 Pathology Results   FINAL MICROSCOPIC DIAGNOSIS:   A. BREAST, RIGHT LATERAL SATELLITE LESION, LUMPECTOMY:  - No residual invasive carcinoma status post neoadjuvant treatment.  - One intraparenchymal lymph node, negative for carcinoma (0/1).  - See oncology table.   B. BREAST, RIGHT DOMINANT MASS, LUMPECTOMY:  - No residual invasive carcinoma status post neoadjuvant treatment.  - Biopsy site changes.  - See oncology table.   C. SENTINEL LYMPH NODE, RIGHT AXILLARY, EXCISION:  - One lymph node, negative for carcinoma (0/1).   D. SENTINEL LYMPH NODE, RIGHT AXILLARY, EXCISION:  - One lymph node, negative for carcinoma (0/1).   E. SENTINEL LYMPH NODE, RIGHT AXILLARY, EXCISION:  - One lymph node, negative for carcinoma (0/1).   F. SENTINEL LYMPH NODE, RIGHT AXILLARY, EXCISION:  - One lymph node, negative for carcinoma (0/1).    05/19/2019 Cancer Staging   Staging form: Breast, AJCC 8th Edition - Pathologic stage from 05/19/2019: No Stage Recommended (ypT0, pN0, cM0, GX, ER: Not Assessed, PR: Not Assessed, HER2: Not Assessed) - Signed by Christina Merle, MD on 06/01/2019   06/16/2019 - 07/15/2019 Radiation Therapy   Adjuvant Radiation with Christina Sexton  06/16/2019 through 07/15/2019     CURRENT THERAPY: Surveillance   INTERVAL HISTORY: Christina Sexton returns for follow up as scheduled. She was last seen by Christina Sexton 09/13/20. Mammogram 12/28/20 showed lumpectomy changes and stable calcifications on the right, and stable asymmetry on the left, overall benign.  She is doing well in general.  Still has residual numbness in her fingertips from chemo but  functions well, not dropping things.  Denies concerns in her breast such as new lump/mass, nipple discharge or inversion, or skin change.  Denies bone pain out of the ordinary.  [facility]  [facility]ite is normal.  She has occasional bright red bleeding after wiping BM.  Last colonoscopy 2019 showed hemorrhoids, on 5-year recall.  Denies change in her bowel habits, unintentional weight loss, fatigue, or any other specific complaints.   MEDICAL HISTORY:  Past Medical History:  Diagnosis Date   Allergy    Anemia    Asthma    as a child    Cancer ([place])    Breast-R   Chronic kidney disease    Heart murmur    MVP   History of kidney stones     x 3 passed 1   History of radiation therapy 06/16/19- 07/15/19   Right Breast 15 fractions of 2.67 Gy to total 40.05 Gy. Right Breast boost 5 fractions of 2 Gy to total 10 Gy   Hyperlipidemia    Hypertension    Kidney stone    Mitral valve prolapse    Osteoporosis     SURGICAL HISTORY: Past Surgical History:  Procedure Laterality Date   BREAST BIOPSY Left 12/27/2015   benign   BREAST BIOPSY Right 07/26/2013   benign   BREAST EXCISIONAL BIOPSY Right 09/08/2013   high risk    BREAST LUMPECTOMY WITH RADIOACTIVE SEED AND SENTINEL LYMPH NODE BIOPSY Right 05/19/2019   Procedure: BREAST LUMPECTOMY WITH RADIOACTIVE SEED X2  AND SENTINEL LYMPH NODE BIOPSY;  Surgeon: Christina Klein, MD;  Location: [place];  Service: General;  Laterality: Right;   COLONOSCOPY W/ POLYPECTOMY     CYSTOSCOPY     PORT-A-CATH REMOVAL Left 12/07/2019   Procedure: REMOVAL PORT-A-CATH;  Surgeon: Christina Klein, MD;  Location: [place];  Service: General;  Laterality: Left;   PORTACATH PLACEMENT N/A 12/28/2018   Procedure: INSERTION PORT-A-CATH;  Surgeon: Christina Klein, MD;  Location: [facility];  Service: General;  Laterality: N/A;   SHOULDER SURGERY Right    frozen shoulder and bone spur    I have reviewed the social history and family history with the patient and they  are unchanged from previous note.  ALLERGIES:  is allergic to oxycodone.  MEDICATIONS:  Current Outpatient Medications  Medication Sig Dispense Refill   atorvastatin (LIPITOR) 20 MG tablet Take 20 mg by mouth at bedtime.      Current Facility-Administered Medications  Medication Dose Route Frequency Provider Last Rate Last Admin   0.9 %  sodium chloride infusion  500 mL Intravenous Once Nandigam, Venia Minks, MD        PHYSICAL EXAMINATION: ECOG PERFORMANCE STATUS: 0 - Asymptomatic  Vitals:   03/15/21 1022  BP: (!) 145/81  Pulse: 69  Resp: 18  SpO2: 98%   Filed Weights   03/15/21 1022  Weight: 99 lb (44.9 kg)    GENERAL:alert, no distress and comfortable SKIN: No rash EYES: sclera clear NECK: Without mass LYMPH:  no palpable cervical or supraclavicular lymphadenopathy LUNGS: clear with normal breathing effort HEART: regular rate & rhythm, no lower extremity edema ABDOMEN:abdomen soft, non-tender and normal bowel sounds NEURO: alert & oriented x 3 with fluent speech, no focal motor deficits Breast exam: Breasts are symmetrical without nipple discharge or inversion.  S/p right lumpectomy, incisions completely healed.  Mild scar tissue at the right upper outer quadrant.  No palpable mass or nodularity in either breast or axilla that I could appreciate.  LABORATORY DATA:  I have reviewed the data as listed CBC Latest Ref Rng & Units 03/15/2021 09/13/2020 04/12/2020  WBC 4.0 - 10.5 K/uL 3.4(L) 4.2 3.5(L)  Hemoglobin 12.0 - 15.0 g/dL 13.2 13.1 13.2  Hematocrit 36.0 - 46.0 % 40.6 40.1 39.5  Platelets 150 - 400 K/uL 150 152 162     CMP Latest Ref Rng & Units 03/15/2021 09/13/2020 04/12/2020  Glucose 70 - 99 mg/dL 99 102(H) 94  BUN 8 - 23 mg/dL _0 Creatinine 0.44 - 1.00 mg/dL 0.91 0.91 0.81  Sodium 135 - 145 mmol/L 141 139 142  Potassium 3.5 - 5.1 mmol/L 3.7 3.6 3.7  Chloride 98 - 111 mmol/L 104 104 104  CO2 22 - 32 mmol/L 28 29 32  Calcium 8.9 - 10.3 mg/dL 9.6 9.3  9.7  Total Protein 6.5 - 8.1 g/dL 6.9 7.0 7.1  Total Bilirubin 0.3 - 1.2 mg/dL 1.2 0.9 0.8  Alkaline Phos 38 - 126 U/L 58 54 52  AST 15 - 41 U/L _1 ALT 0 - 44 U/L 21 32 17      RADIOGRAPHIC STUDIES: I have personally reviewed the radiological images as listed and agreed with the findings in the report. No results found.   ASSESSMENT & PLAN: Christina Sexton is a 70 y.o. female   1. 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  S/p 5/6 cycles of neoadjuvant  TCHP and only 5 months of maintenance Herceptin per patient request.  She underwent right lumpectomy by Christina Sexton 05/19/2019 with a complete pathologic response, and completed adjuvant radiation. -Due to ER/PR negative disease, she would not benefit from antiestrogen therapy -On surveillance, mammogram 11/2020 negative for malignancy   2. Shingles to left buttock -She developed shingles outbreak on her buttock after cycle 5 TCHP dosed on 03/30/19, s/p valacyclovir  -Resolved  3.  Bone health  -Previously declined to repeat DEXA, continue calcium, vitamin D, and weightbearing exercise  4. Social and financial support  -she is single, lives alone, no children.  She has friend social support ands strong faith in God. Our chaplain has reached out to her  5.  Health maintenance -She completed 4 COVID vaccines but declined a flu shot today, I encouraged her to continue precautions -Last colonoscopy 12/2017 by Dr. Silverio Decamp which showed hemorrhoids.  She does have intermittent hemorrhoidal bleeding. -Next due 12/2022, or sooner if she has worsening bleeding, change in bowel habits, abdominal pain, or other GI concerns.    Disposition: Ms. Steinke is clinically doing well.  Breast exam is benign, labs are normal.  Mammogram 11/2020 is negative for malignancy.  Overall there is no clinical concern for breast  cancer recurrence.  She is over 2 years from initial diagnosis.  We discussed the recurrence risk in HER2+ breast cancer.  Continue surveillance.  Next routine follow-up in 6 months, or sooner if needed.  Mammogram 11/2021.  We reviewed signs and symptoms of recurrence.  All questions were answered. The patient knows to call the clinic with any problems, questions or concerns. No barriers to learning were detected.     Alla Feeling, NP 03/15/21

## 2021-10-01 ENCOUNTER — Telehealth: Payer: Self-pay | Admitting: Nurse Practitioner

## 2021-10-01 NOTE — Telephone Encounter (Signed)
Rescheduled upcoming appointment due to provider's PAL. Patient is aware of changes. ?

## 2021-10-11 ENCOUNTER — Inpatient Hospital Stay: Payer: Medicare HMO | Admitting: Nurse Practitioner

## 2021-10-11 ENCOUNTER — Inpatient Hospital Stay: Payer: Medicare HMO

## 2021-10-19 ENCOUNTER — Other Ambulatory Visit: Payer: Self-pay

## 2021-10-19 DIAGNOSIS — C50411 Malignant neoplasm of upper-outer quadrant of right female breast: Secondary | ICD-10-CM

## 2021-10-23 ENCOUNTER — Encounter: Payer: Self-pay | Admitting: Nurse Practitioner

## 2021-10-23 ENCOUNTER — Inpatient Hospital Stay: Payer: Medicare HMO | Attending: Nurse Practitioner

## 2021-10-23 ENCOUNTER — Inpatient Hospital Stay (HOSPITAL_BASED_OUTPATIENT_CLINIC_OR_DEPARTMENT_OTHER): Payer: Medicare HMO | Admitting: Nurse Practitioner

## 2021-10-23 ENCOUNTER — Other Ambulatory Visit: Payer: Self-pay

## 2021-10-23 VITALS — BP 131/72 | HR 74 | Temp 97.3°F | Resp 15 | Wt 98.9 lb

## 2021-10-23 DIAGNOSIS — C50411 Malignant neoplasm of upper-outer quadrant of right female breast: Secondary | ICD-10-CM | POA: Diagnosis not present

## 2021-10-23 DIAGNOSIS — Z171 Estrogen receptor negative status [ER-]: Secondary | ICD-10-CM

## 2021-10-23 DIAGNOSIS — Z9221 Personal history of antineoplastic chemotherapy: Secondary | ICD-10-CM | POA: Diagnosis not present

## 2021-10-23 DIAGNOSIS — Z853 Personal history of malignant neoplasm of breast: Secondary | ICD-10-CM | POA: Insufficient documentation

## 2021-10-23 DIAGNOSIS — Z923 Personal history of irradiation: Secondary | ICD-10-CM | POA: Insufficient documentation

## 2021-10-23 LAB — CBC WITH DIFFERENTIAL (CANCER CENTER ONLY)
Abs Immature Granulocytes: 0.01 10*3/uL (ref 0.00–0.07)
Basophils Absolute: 0 10*3/uL (ref 0.0–0.1)
Basophils Relative: 1 %
Eosinophils Absolute: 0.1 10*3/uL (ref 0.0–0.5)
Eosinophils Relative: 2 %
HCT: 41.6 % (ref 36.0–46.0)
Hemoglobin: 13.7 g/dL (ref 12.0–15.0)
Immature Granulocytes: 0 %
Lymphocytes Relative: 24 %
Lymphs Abs: 1 10*3/uL (ref 0.7–4.0)
MCH: 31 pg (ref 26.0–34.0)
MCHC: 32.9 g/dL (ref 30.0–36.0)
MCV: 94.1 fL (ref 80.0–100.0)
Monocytes Absolute: 0.4 10*3/uL (ref 0.1–1.0)
Monocytes Relative: 10 %
Neutro Abs: 2.5 10*3/uL (ref 1.7–7.7)
Neutrophils Relative %: 63 %
Platelet Count: 155 10*3/uL (ref 150–400)
RBC: 4.42 MIL/uL (ref 3.87–5.11)
RDW: 12.4 % (ref 11.5–15.5)
WBC Count: 3.9 10*3/uL — ABNORMAL LOW (ref 4.0–10.5)
nRBC: 0 % (ref 0.0–0.2)

## 2021-10-23 LAB — CMP (CANCER CENTER ONLY)
ALT: 20 U/L (ref 0–44)
AST: 21 U/L (ref 15–41)
Albumin: 4.5 g/dL (ref 3.5–5.0)
Alkaline Phosphatase: 54 U/L (ref 38–126)
Anion gap: 7 (ref 5–15)
BUN: 20 mg/dL (ref 8–23)
CO2: 31 mmol/L (ref 22–32)
Calcium: 9.7 mg/dL (ref 8.9–10.3)
Chloride: 103 mmol/L (ref 98–111)
Creatinine: 0.88 mg/dL (ref 0.44–1.00)
GFR, Estimated: >60 mL/min (ref >60)
Glucose, Bld: 103 mg/dL — ABNORMAL HIGH (ref 70–99)
Potassium: 3.7 mmol/L (ref 3.5–5.1)
Sodium: 141 mmol/L (ref 135–145)
Total Bilirubin: 0.9 mg/dL (ref 0.3–1.2)
Total Protein: 7 g/dL (ref 6.5–8.1)

## 2021-10-23 NOTE — Progress Notes (Signed)
?[place]   ?Telephone:([phone] [phone]   ?Clinic Follow up Note  ? ?Patient Care Team: ?Christina Pretty, MD as PCP - General (Internal Medicine) ?[name], RN as Oncology Nurse Navigator ?[name], RN as Oncology Nurse Navigator ?[name], MD as Consulting Physician (General Surgery) ?[name], MD as Consulting Physician (Hematology) ?[name], MD as Attending Physician (Radiation Oncology) ?10/23/2021 ? ?CHIEF COMPLAINT: Follow up right breast cancer  ? ?SUMMARY OF ONCOLOGIC HISTORY: ?Oncology History Overview Note  ?Cancer Staging ?Malignant neoplasm of upper-outer quadrant of right female breast ([place]) ?Staging form: Breast, AJCC 8th Edition ?- Clinical stage from 12/09/2018: Stage IIA (cT2, cN0, cM0, G3, ER-, PR-, HER2+) - Signed by [name], MD on 12/15/2018 ?- Pathologic stage from 05/19/2019: No Stage Recommended (ypT0, pN0, cM0, GX, ER: Not Assessed, PR: Not Assessed, HER2: Not Assessed) - Signed by [name], MD on 06/01/2019 ? ?  ?Malignant neoplasm of upper-outer quadrant of right female breast ([place])  ?12/07/2018 Mammogram  ? Mammogram 12/07/18  ?IMPRESSION: ?1. 3 cm mass in the 11 o'clock position of the right breast, 4 cm the nipple, measuring 3 x 2 x 2 cm, highly ?suspicious for breast carcinoma. No enlarged or abnormal right ?axillary lymph nodes. ?  ?12/09/2018 Cancer Staging  ? Staging form: Breast, AJCC 8th Edition ?- Clinical stage from 12/09/2018: Stage IIA (cT2, cN0, cM0, G3, ER-, PR-, HER2+) - Signed by [name], MD on 12/15/2018 ? ?  ?12/09/2018 Initial Biopsy  ? Diagnosis 12/09/18 ?Breast, right, needle core biopsy, 11 o'clock ?- INVASIVE DUCTAL CARCINOMA. ?- DUCTAL CARCINOMA IN SITU. ?- SEE COMMENT. ?  ?12/09/2018 Receptors her2  ? The tumor cells are POSITIVE for Her2 (3+). ?Estrogen Receptor: 0%, NEGATIVE ?Progesterone Receptor: 0%, NEGATIVE ?Proliferation Marker Ki67: 30% ?  ?12/15/2018 Initial Diagnosis  ? Malignant neoplasm of upper-outer  quadrant of right female breast ([place]) ? ?  ?12/21/2018 Breast MRI  ? IMPRESSION: ?1. Approximate 4 cm mass involving the UPPER RIGHT breast with ?extension into the UPPER INNER QUADRANT and UPPER OUTER QUADRANT, ?predominantly located in the [place], biopsy-proven invasive ductal carcinoma and DCIS. The mass abuts the chest wall though I cannot confirm pectoralis muscle invasion. ?2. Superficial satellite mass versus pathologic intramammary lymph node in the UPPER OUTER QUADRANT of the RIGHT breast measuring approximately 1 cm, located LATERAL to the dominant mass. ?3. No MRI evidence of malignancy involving the LEFT breast. ?4. No pathologic lymphadenopathy otherwise. ?  ?12/24/2018 Pathology Results  ? Diagnosis ?Breast, right, needle core biopsy, upper outer quadrant, peripheral to the recently biopsy proven breast carcinoma ?- INVASIVE DUCTAL CARCINOMA. SEE COMMENT. ?Results: ?IMMUNOHISTOCHEMICAL AND MORPHOMETRIC ANALYSIS PERFORMED MANUALLY ?The tumor cells are POSITIVE for Her2 (3+). ?Estrogen Receptor: 0%, NEGATIVE ?Progesterone Receptor: 0%, NEGATIVE ?Proliferation Marker Ki67: 40% ?  ?01/06/2019 - 10/25/2019 Chemotherapy  ? Neoadjuvant chemotherapy TCHP beginning 01/06/19 completed 5 cycle of TCHP on 03/30/19.  ?She started maintenance Herceptin on 05/04/19. D/c Perjeta after 05/04/19 due to low benefit. Stopped Herceptin after 10/25/19 due to patient request.  ?  ?04/15/2019 Breast MRI  ? IMPRESSION: ?1. Interval resolution of the 2 enhancing masses corresponding with biopsy proven malignancy in the upper right breast. There is no residual or new, suspicious enhancement. ?2. No MRI evidence of malignancy in the left breast. ?  ?04/15/2019 Echocardiogram  ? FINDINGS ? Left Ventricle: Left ventricular ejection fraction, by visual estimation, is 55 to 60%. The left ventricle has normal function. There is no  left ventricular hypertrophy. Normal left ventricular size. Spectral Doppler shows Left ventricular  diastolic  ?parameters were normal pattern of LV diastolic filling. Compared with the echo 12/28/50, systolic function is slightly worse, especially in the anteroseptum. However overall strain is slightly better. ?  ?  ?05/19/2019 Surgery  ? BREAST LUMPECTOMY WITH RADIOACTIVE SEED X2  AND SENTINEL LYMPH NODE BIOPSY by Dr. Barry Dienes  ?05/19/19  ?  ?05/19/2019 Pathology Results  ? FINAL MICROSCOPIC DIAGNOSIS:  ? ?A. BREAST, RIGHT LATERAL SATELLITE LESION, LUMPECTOMY:  ?- No residual invasive carcinoma status post neoadjuvant treatment.  ?- One intraparenchymal lymph node, negative for carcinoma (0/1).  ?- See oncology table.  ? ?B. BREAST, RIGHT DOMINANT MASS, LUMPECTOMY:  ?- No residual invasive carcinoma status post neoadjuvant treatment.  ?- Biopsy site changes.  ?- See oncology table.  ? ?C. SENTINEL LYMPH NODE, RIGHT AXILLARY, EXCISION:  ?- One lymph node, negative for carcinoma (0/1).  ? ?D. SENTINEL LYMPH NODE, RIGHT AXILLARY, EXCISION:  ?- One lymph node, negative for carcinoma (0/1).  ? ?E. SENTINEL LYMPH NODE, RIGHT AXILLARY, EXCISION:  ?- One lymph node, negative for carcinoma (0/1).  ? ?F. SENTINEL LYMPH NODE, RIGHT AXILLARY, EXCISION:  ?- One lymph node, negative for carcinoma (0/1).  ?  ?05/19/2019 Cancer Staging  ? Staging form: Breast, AJCC 8th Edition ?- Pathologic stage from 05/19/2019: No Stage Recommended (ypT0, pN0, cM0, GX, ER: Not Assessed, PR: Not Assessed, HER2: Not Assessed) - Signed by [name], MD on 06/01/2019 ? ?  ?06/16/2019 - 07/15/2019 Radiation Therapy  ? Adjuvant Radiation with Dr. Isidore Moos  06/16/2019 through 07/15/2019 ?  ? ? ?CURRENT THERAPY: Surveillance  ? ?INTERVAL HISTORY: [name] returns for follow up as scheduled. Last seen by me 03/15/21.  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  She is doing well from a breast cancer standpoint.  Denies concerns in her breast such  as new lump/mass, nipple discharge or inversion, or skin change.  [facility]  Energy and appetite are normal.  She gets calcium from her diet and takes vitamin D.  Denies new bone pain, abdominal pain/bloating, change in bowel habits, recent fever, cough, shortness of breath, or any other new complaints. ? ?All other systems were reviewed with the patient and are negative. ? ?MEDICAL HISTORY:  ?Past Medical History:  ?Diagnosis Date  ? Allergy   ? Anemia   ? Asthma   ? as a child   ? Cancer [facility])   ? Breast-R  ? Chronic kidney disease   ? Heart murmur   ? MVP  ? History of kidney stones   ?  x 3 passed 1  ? History of radiation therapy 06/16/19- 07/15/19  ? Right Breast 15 fractions of 2.67 Gy to total 40.05 Gy. Right Breast boost 5 fractions of 2 Gy to total 10 Gy  ? Hyperlipidemia   ? Hypertension   ? Kidney stone   ? Mitral valve prolapse   ? Osteoporosis   ? ? ?SURGICAL HISTORY: ?Past Surgical History:  ?Procedure Laterality Date  ? BREAST BIOPSY Left 12/27/2015  ? benign  ? BREAST BIOPSY Right 07/26/2013  ? benign  ? BREAST EXCISIONAL BIOPSY Right 09/08/2013  ? high risk   ? BREAST LUMPECTOMY WITH RADIOACTIVE SEED AND SENTINEL LYMPH NODE BIOPSY Right 05/19/2019  ? Procedure: BREAST LUMPECTOMY WITH RADIOACTIVE SEED  X2  AND SENTINEL LYMPH NODE BIOPSY;  Surgeon: [name], MD;  Location: [facility];  Service: General;  Laterality: Right;  ? COLONOSCOPY W/ POLYPECTOMY    ? CYSTOSCOPY    ? PORT-A-CATH REMOVAL Left 12/07/2019  ? Procedure: REMOVAL PORT-A-CATH;  Surgeon: [name], MD;  Location: [place];  Service: General;  Laterality: Left;  ? PORTACATH PLACEMENT N/A 12/28/2018  ? Procedure: INSERTION PORT-A-CATH;  Surgeon: [name], MD;  Location[facility];  Service: General;  Laterality: N/A;  ? SHOULDER SURGERY Right   ? frozen shoulder and bone spur  ? ? ?I have reviewed the social history and family history with the patient and they are unchanged from previous note. ? ?ALLERGIES:  is  allergic to oxycodone. ? ?MEDICATIONS:  ?Current Outpatient Medications  ?Medication Sig Dispense Refill  ? atorvastatin (LIPITOR) 20 MG tablet Take 20 mg by mouth at bedtime.     ? ?Current Facility-[facility]

## 2021-10-24 ENCOUNTER — Telehealth: Payer: Self-pay | Admitting: Nurse Practitioner

## 2021-10-24 NOTE — Telephone Encounter (Signed)
Scheduled follow-up appointment per 4/25 los. Patient is aware. ?

## 2021-11-15 DIAGNOSIS — H25813 Combined forms of age-related cataract, bilateral: Secondary | ICD-10-CM | POA: Diagnosis not present

## 2021-11-15 DIAGNOSIS — H1045 Other chronic allergic conjunctivitis: Secondary | ICD-10-CM | POA: Diagnosis not present

## 2021-11-15 DIAGNOSIS — H04223 Epiphora due to insufficient drainage, bilateral lacrimal glands: Secondary | ICD-10-CM | POA: Diagnosis not present

## 2021-11-15 DIAGNOSIS — H524 Presbyopia: Secondary | ICD-10-CM | POA: Diagnosis not present

## 2021-11-15 DIAGNOSIS — H47393 Other disorders of optic disc, bilateral: Secondary | ICD-10-CM | POA: Diagnosis not present

## 2021-12-10 DIAGNOSIS — Z0100 Encounter for examination of eyes and vision without abnormal findings: Secondary | ICD-10-CM | POA: Diagnosis not present

## 2021-12-31 ENCOUNTER — Ambulatory Visit
Admission: RE | Admit: 2021-12-31 | Discharge: 2021-12-31 | Disposition: A | Discharge status: Home / Self Care | Payer: Medicare HMO | Source: Ambulatory Visit | Attending: Nurse Practitioner | Admitting: Nurse Practitioner

## 2021-12-31 DIAGNOSIS — Z853 Personal history of malignant neoplasm of breast: Secondary | ICD-10-CM | POA: Diagnosis not present

## 2021-12-31 DIAGNOSIS — Z171 Estrogen receptor negative status [ER-]: Secondary | ICD-10-CM

## 2021-12-31 DIAGNOSIS — R922 Inconclusive mammogram: Secondary | ICD-10-CM | POA: Diagnosis not present

## 2022-01-07 DIAGNOSIS — R319 Hematuria, unspecified: Secondary | ICD-10-CM | POA: Diagnosis not present

## 2022-01-07 DIAGNOSIS — E559 Vitamin D deficiency, unspecified: Secondary | ICD-10-CM | POA: Diagnosis not present

## 2022-01-07 DIAGNOSIS — E7800 Pure hypercholesterolemia, unspecified: Secondary | ICD-10-CM | POA: Diagnosis not present

## 2022-01-07 DIAGNOSIS — Z0000 Encounter for general adult medical examination without abnormal findings: Secondary | ICD-10-CM | POA: Diagnosis not present

## 2022-01-14 DIAGNOSIS — M810 Age-related osteoporosis without current pathological fracture: Secondary | ICD-10-CM | POA: Diagnosis not present

## 2022-01-14 DIAGNOSIS — Z01419 Encounter for gynecological examination (general) (routine) without abnormal findings: Secondary | ICD-10-CM | POA: Diagnosis not present

## 2022-01-14 DIAGNOSIS — Z8249 Family history of ischemic heart disease and other diseases of the circulatory system: Secondary | ICD-10-CM | POA: Diagnosis not present

## 2022-01-14 DIAGNOSIS — E7800 Pure hypercholesterolemia, unspecified: Secondary | ICD-10-CM | POA: Diagnosis not present

## 2022-01-14 DIAGNOSIS — Z9221 Personal history of antineoplastic chemotherapy: Secondary | ICD-10-CM | POA: Diagnosis not present

## 2022-01-14 DIAGNOSIS — E559 Vitamin D deficiency, unspecified: Secondary | ICD-10-CM | POA: Diagnosis not present

## 2022-01-14 DIAGNOSIS — Z0000 Encounter for general adult medical examination without abnormal findings: Secondary | ICD-10-CM | POA: Diagnosis not present

## 2022-01-14 DIAGNOSIS — Z1212 Encounter for screening for malignant neoplasm of rectum: Secondary | ICD-10-CM | POA: Diagnosis not present

## 2022-01-21 ENCOUNTER — Other Ambulatory Visit: Payer: Self-pay

## 2022-01-29 ENCOUNTER — Other Ambulatory Visit: Payer: Self-pay

## 2022-03-02 ENCOUNTER — Other Ambulatory Visit: Payer: Self-pay

## 2022-04-22 ENCOUNTER — Other Ambulatory Visit: Payer: Self-pay

## 2022-04-22 DIAGNOSIS — C50411 Malignant neoplasm of upper-outer quadrant of right female breast: Secondary | ICD-10-CM

## 2022-04-23 ENCOUNTER — Encounter: Payer: Self-pay | Admitting: Nurse Practitioner

## 2022-04-23 ENCOUNTER — Inpatient Hospital Stay: Payer: Medicare HMO | Attending: Nurse Practitioner | Admitting: Nurse Practitioner

## 2022-04-23 ENCOUNTER — Inpatient Hospital Stay: Payer: Medicare HMO

## 2022-04-23 ENCOUNTER — Other Ambulatory Visit: Payer: Self-pay

## 2022-04-23 VITALS — BP 148/81 | HR 76 | Temp 98.5°F | Resp 16 | Ht 63.0 in | Wt 101.9 lb

## 2022-04-23 DIAGNOSIS — C50411 Malignant neoplasm of upper-outer quadrant of right female breast: Secondary | ICD-10-CM | POA: Insufficient documentation

## 2022-04-23 DIAGNOSIS — Z170 Estrogen receptor positive status [ER+]: Secondary | ICD-10-CM | POA: Insufficient documentation

## 2022-04-23 DIAGNOSIS — Z171 Estrogen receptor negative status [ER-]: Secondary | ICD-10-CM | POA: Diagnosis not present

## 2022-04-23 DIAGNOSIS — Z923 Personal history of irradiation: Secondary | ICD-10-CM | POA: Insufficient documentation

## 2022-04-23 DIAGNOSIS — C4481 Basal cell carcinoma of overlapping sites of skin: Secondary | ICD-10-CM | POA: Diagnosis not present

## 2022-04-23 LAB — CMP (CANCER CENTER ONLY)
ALT: 13 U/L (ref 0–44)
AST: 16 U/L (ref 15–41)
Albumin: 4.6 g/dL (ref 3.5–5.0)
Alkaline Phosphatase: 45 U/L (ref 38–126)
Anion gap: 6 (ref 5–15)
BUN: 20 mg/dL (ref 8–23)
CO2: 30 mmol/L (ref 22–32)
Calcium: 9.8 mg/dL (ref 8.9–10.3)
Chloride: 105 mmol/L (ref 98–111)
Creatinine: 0.92 mg/dL (ref 0.44–1.00)
GFR, Estimated: >60 mL/min (ref >60)
Glucose, Bld: 106 mg/dL — ABNORMAL HIGH (ref 70–99)
Potassium: 3.8 mmol/L (ref 3.5–5.1)
Sodium: 141 mmol/L (ref 135–145)
Total Bilirubin: 0.8 mg/dL (ref 0.3–1.2)
Total Protein: 7 g/dL (ref 6.5–8.1)

## 2022-04-23 LAB — CBC WITH DIFFERENTIAL (CANCER CENTER ONLY)
Abs Immature Granulocytes: 0.01 10*3/uL (ref 0.00–0.07)
Basophils Absolute: 0 10*3/uL (ref 0.0–0.1)
Basophils Relative: 1 %
Eosinophils Absolute: 0.1 10*3/uL (ref 0.0–0.5)
Eosinophils Relative: 1 %
HCT: 41.5 % (ref 36.0–46.0)
Hemoglobin: 13.9 g/dL (ref 12.0–15.0)
Immature Granulocytes: 0 %
Lymphocytes Relative: 28 %
Lymphs Abs: 1.3 10*3/uL (ref 0.7–4.0)
MCH: 31.1 pg (ref 26.0–34.0)
MCHC: 33.5 g/dL (ref 30.0–36.0)
MCV: 92.8 fL (ref 80.0–100.0)
Monocytes Absolute: 0.5 10*3/uL (ref 0.1–1.0)
Monocytes Relative: 10 %
Neutro Abs: 2.7 10*3/uL (ref 1.7–7.7)
Neutrophils Relative %: 60 %
Platelet Count: 188 10*3/uL (ref 150–400)
RBC: 4.47 MIL/uL (ref 3.87–5.11)
RDW: 12.6 % (ref 11.5–15.5)
WBC Count: 4.6 10*3/uL (ref 4.0–10.5)
nRBC: 0 % (ref 0.0–0.2)

## 2022-04-23 NOTE — Progress Notes (Addendum)
[place]   [phone] [phone]   Clinic Follow up Note   Patient Care Team: Deland Pretty, MD as PCP - General (Internal Medicine) [name], RN as Oncology Nurse Navigator [name], RN as Oncology Nurse Navigator [name], MD as Consulting Physician (General Surgery) [name], MD as Consulting Physician (Hematology) [name], MD as Attending Physician (Radiation Oncology) 04/23/2022  CHIEF COMPLAINT: Follow-up right breast cancer  SUMMARY OF ONCOLOGIC HISTORY: Oncology History Overview Note  Cancer Staging Malignant neoplasm of upper-outer quadrant of right female breast [facility]) Staging form: Breast, AJCC 8th Edition - Clinical stage from 12/09/2018: Stage IIA (cT2, cN0, cM0, G3, ER-, PR-, HER2+) - Signed by [name], MD on 12/15/2018 - Pathologic stage from 05/19/2019: No Stage Recommended (ypT0, pN0, cM0, GX, ER: Not Assessed, PR: Not Assessed, HER2: Not Assessed) - Signed by [name], MD on 06/01/2019    Malignant neoplasm of upper-outer quadrant of right female breast ([place])  12/07/2018 Mammogram   Mammogram 12/07/18  IMPRESSION: 1. 3 cm mass in the 11 o'clock position of the right breast, 4 cm the nipple, measuring 3 x 2 x 2 cm, highly suspicious for breast carcinoma. No enlarged or abnormal right axillary lymph nodes.   12/09/2018 Cancer Staging   Staging form: Breast, AJCC 8th Edition - Clinical stage from 12/09/2018: Stage IIA (cT2, cN0, cM0, G3, ER-, PR-, HER2+) - Signed by [name], MD on 12/15/2018   12/09/2018 Initial Biopsy   Diagnosis 12/09/18 Breast, right, needle core biopsy, 11 o'clock - INVASIVE DUCTAL CARCINOMA. - DUCTAL CARCINOMA IN SITU. - SEE COMMENT.   12/09/2018 Receptors her2   The tumor cells are POSITIVE for Her2 (3+). Estrogen Receptor: 0%, NEGATIVE Progesterone Receptor: 0%, NEGATIVE Proliferation Marker Ki67: 30%   12/15/2018 Initial Diagnosis   Malignant neoplasm of upper-outer  quadrant of right female breast ([place])   12/21/2018 Breast MRI   IMPRESSION: 1. Approximate 4 cm mass involving the UPPER RIGHT breast with extension into the UPPER INNER QUADRANT and UPPER OUTER QUADRANT, predominantly located in the [place], biopsy-proven invasive ductal carcinoma and DCIS. The mass abuts the chest wall though I cannot confirm pectoralis muscle invasion. 2. Superficial satellite mass versus pathologic intramammary lymph node in the UPPER OUTER QUADRANT of the RIGHT breast measuring approximately 1 cm, located LATERAL to the dominant mass. 3. No MRI evidence of malignancy involving the LEFT breast. 4. No pathologic lymphadenopathy otherwise.   12/24/2018 Pathology Results   Diagnosis Breast, right, needle core biopsy, upper outer quadrant, peripheral to the recently biopsy proven breast carcinoma - INVASIVE DUCTAL CARCINOMA. SEE COMMENT. Results: IMMUNOHISTOCHEMICAL AND MORPHOMETRIC ANALYSIS PERFORMED MANUALLY The tumor cells are POSITIVE for Her2 (3+). Estrogen Receptor: 0%, NEGATIVE Progesterone Receptor: 0%, NEGATIVE Proliferation Marker Ki67: 40%   01/06/2019 - 10/25/2019 Chemotherapy   Neoadjuvant chemotherapy TCHP beginning 01/06/19 completed 5 cycle of TCHP on 03/30/19.  She started maintenance Herceptin on 05/04/19. D/c Perjeta after 05/04/19 due to low benefit. Stopped Herceptin after 10/25/19 due to patient request.    04/15/2019 Breast MRI   IMPRESSION: 1. Interval resolution of the 2 enhancing masses corresponding with biopsy proven malignancy in the upper right breast. There is no residual or new, suspicious enhancement. 2. No MRI evidence of malignancy in the left breast.   04/15/2019 Echocardiogram   FINDINGS  Left Ventricle: Left ventricular ejection fraction, by visual estimation, is 55 to 60%. The left ventricle has normal function. There is no left ventricular hypertrophy. Normal  left ventricular size. Spectral Doppler shows Left ventricular  diastolic  parameters were normal pattern of LV diastolic filling. Compared with the echo 2/70/35, systolic function is slightly worse, especially in the anteroseptum. However overall strain is slightly better.     05/19/2019 Surgery   BREAST LUMPECTOMY WITH RADIOACTIVE SEED X2  AND SENTINEL LYMPH NODE BIOPSY by Dr. Barry Dienes  05/19/19    05/19/2019 Pathology Results   FINAL MICROSCOPIC DIAGNOSIS:   A. BREAST, RIGHT LATERAL SATELLITE LESION, LUMPECTOMY:  - No residual invasive carcinoma status post neoadjuvant treatment.  - One intraparenchymal lymph node, negative for carcinoma (0/1).  - See oncology table.   B. BREAST, RIGHT DOMINANT MASS, LUMPECTOMY:  - No residual invasive carcinoma status post neoadjuvant treatment.  - Biopsy site changes.  - See oncology table.   C. SENTINEL LYMPH NODE, RIGHT AXILLARY, EXCISION:  - One lymph node, negative for carcinoma (0/1).   D. SENTINEL LYMPH NODE, RIGHT AXILLARY, EXCISION:  - One lymph node, negative for carcinoma (0/1).   E. SENTINEL LYMPH NODE, RIGHT AXILLARY, EXCISION:  - One lymph node, negative for carcinoma (0/1).   F. SENTINEL LYMPH NODE, RIGHT AXILLARY, EXCISION:  - One lymph node, negative for carcinoma (0/1).    05/19/2019 Cancer Staging   Staging form: Breast, AJCC 8th Edition - Pathologic stage from 05/19/2019: No Stage Recommended (ypT0, pN0, cM0, GX, ER: Not Assessed, PR: Not Assessed, HER2: Not Assessed) - Signed by [name], MD on 06/01/2019   06/16/2019 - 07/15/2019 Radiation Therapy   Adjuvant Radiation with Dr. Isidore Moos  06/16/2019 through 07/15/2019     CURRENT THERAPY: Surveillance  INTERVAL HISTORY: Christina Sexton returns for follow-up as scheduled, last seen by me 10/23/2021.  She is doing well overall without changes in her health.  Her motivation is slightly low she attributes to current world events.  Denies breast concerns such as new lump/mass, nipple discharge or inversion, or skin change.  Denies bone pain,  headache, unintentional weight loss, or other new specific complaints.  Would like to learn about ways to give back to the cancer center through volunteering, but not something that will be depressing.  All other systems were reviewed with the patient and are negative.  MEDICAL HISTORY:  Past Medical History:  Diagnosis Date   Allergy    Anemia    Asthma    as a child    Cancer ([place])    Breast-R   Chronic kidney disease    Heart murmur    MVP   History of kidney stones     x 3 passed 1   History of radiation therapy 06/16/19- 07/15/19   Right Breast 15 fractions of 2.67 Gy to total 40.05 Gy. Right Breast boost 5 fractions of 2 Gy to total 10 Gy   Hyperlipidemia    Hypertension    Kidney stone    Mitral valve prolapse    Osteoporosis     SURGICAL HISTORY: Past Surgical History:  Procedure Laterality Date   BREAST BIOPSY Left 12/27/2015   benign   BREAST BIOPSY Right 07/26/2013   benign   BREAST EXCISIONAL BIOPSY Right 09/08/2013   high risk    BREAST LUMPECTOMY WITH RADIOACTIVE SEED AND SENTINEL LYMPH NODE BIOPSY Right 05/19/2019   Procedure: BREAST LUMPECTOMY WITH RADIOACTIVE SEED X2  AND SENTINEL LYMPH NODE BIOPSY;  Surgeon: [name], MD;  Location: [place];  Service: General;  Laterality: Right;   COLONOSCOPY W/ POLYPECTOMY     CYSTOSCOPY     PORT-A-CATH  REMOVAL Left 12/07/2019   Procedure: REMOVAL PORT-A-CATH;  Surgeon: [name], MD;  Location: [place];  Service: General;  Laterality: Left;   PORTACATH PLACEMENT N/A 12/28/2018   Procedure: INSERTION PORT-A-CATH;  Surgeon: [name], MD;  Location: [facility];  Service: General;  Laterality: N/A;   SHOULDER SURGERY Right    frozen shoulder and bone spur    I have reviewed the social history and family history with the patient and they are unchanged from previous note.  ALLERGIES:  is allergic to oxycodone.  MEDICATIONS:  Current Outpatient Medications  Medication Sig Dispense Refill   atorvastatin (LIPITOR)  20 MG tablet Take 20 mg by mouth at bedtime.      Current Facility-Administered Medications  Medication Dose Route Frequency Provider Last Rate Last Admin   0.9 %  sodium chloride infusion  500 mL Intravenous Once Nandigam, Venia Minks, MD        PHYSICAL EXAMINATION: ECOG PERFORMANCE STATUS: 0 - Asymptomatic  Vitals:   04/23/22 1026  BP: (!) 148/81  Pulse: 76  Resp: 16  Temp: 98.5 F (36.9 C)  SpO2: 100%   Filed Weights   04/23/22 1026  Weight: 101 lb 14.4 oz (46.2 kg)    GENERAL:alert, no distress and comfortable SKIN: No rash EYES: sclera clear NECK: Without mass LYMPH:  no palpable cervical or supraclavicular lymphadenopathy  LUNGS:  normal breathing effort HEART: no lower extremity edema NEURO: alert & oriented x 3 with fluent speech, no focal motor/sensory deficits Breast exam: Breasts are symmetrical with bilateral inversion at baseline, no discharge.  S/p right lumpectomy, incisions completely healed with mild scar tissue.  No palpable mass or nodularity in either breast or axilla that I could appreciate.  LABORATORY DATA:  I have reviewed the data as listed    Latest Ref Rng & Units 04/23/2022    9:56 AM 10/23/2021    9:01 AM 03/15/2021   10:03 AM  CBC  WBC 4.0 - 10.5 K/uL 4.6  3.9  3.4   Hemoglobin 12.0 - 15.0 g/dL 13.9  13.7  13.2   Hematocrit 36.0 - 46.0 % 41.5  41.6  40.6   Platelets 150 - 400 K/uL 188  155  150         Latest Ref Rng & Units 04/23/2022    9:56 AM 10/23/2021    9:01 AM 03/15/2021   10:03 AM  CMP  Glucose 70 - 99 mg/dL 106  103  99   BUN 8 - 23 mg/dL $Remove'20  20  12   'ZNufybG$ Creatinine 0.44 - 1.00 mg/dL 0.92  0.88  0.91   Sodium 135 - 145 mmol/L 141  141  141   Potassium 3.5 - 5.1 mmol/L 3.8  3.7  3.7   Chloride 98 - 111 mmol/L 105  103  104   CO2 22 - 32 mmol/L $RemoveB'30  31  28   'sjtLUrld$ Calcium 8.9 - 10.3 mg/dL 9.8  9.7  9.6   Total Protein 6.5 - 8.1 g/dL 7.0  7.0  6.9   Total Bilirubin 0.3 - 1.2 mg/dL 0.8  0.9  1.2   Alkaline Phos 38 - 126 U/L 45  54   58   AST 15 - 41 U/L $Remo'16  21  24   'BdXij$ ALT 0 - 44 U/L $Remo'13  20  21       'SkGUL$ RADIOGRAPHIC STUDIES: I have personally reviewed the radiological images as listed and agreed with the findings in the report. No results found.  ASSESSMENT & PLAN: Christina Sexton is a 71 y.o. female   1. 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  S/p 5/6 cycles of neoadjuvant TCHP and only 5 months of maintenance Herceptin per patient request.  She underwent right lumpectomy by Dr. Barry Dienes 05/19/2019 with a complete pathologic response, and completed adjuvant radiation. -Due to ER/PR negative disease, she would not benefit from antiestrogen therapy -On surveillance, bilateral mammogram 12/31/2021 negative for malignancy, density cat C -Ms. Cozzolino is clinically doing well.  Breast exam is benign, labs are normal, recent mammogram was negative.  Overall no clinical concern for breast cancer recurrence. -She is nearing 3 years from definitive treatment, recurrence risk will decrease.  Continue surveillance. -Follow-up in 6 months, or sooner if needed   2. Basal skin cancer: R nares, L posterior neck base/upper back, L leg -[facility], she has been referred to derm -Reviewed skin protection   3. Shingles to left buttock -She developed shingles outbreak on her buttock after cycle 5 TCHP dosed on 03/30/19, s/p valacyclovir  -Resolved   4.  Bone health  -Continue vitamin D, calcium-rich diet, and weightbearing exercise -Continue to recommend routine DEXA screening, she will discuss with her PCP   5. Social and financial support  -she is single, lives alone, no children.  She has friend social support ands strong faith in God. Our chaplain has reached out to her -She is interested in volunteering at the cancer center, I will ask social work to contact her   6.  Health  maintenance -She completed 4 COVID vaccines but declined a flu shot in Fall 2022 flu season -Last colonoscopy 12/2017 by Dr. Silverio Decamp which showed hemorrhoids.  She has had intermittent hemorrhoidal bleeding in the past. Next due 12/2022 -Courage her to continue healthy active lifestyle and age-appropriate health maintenance   Plan: -July mammogram and today's labs reviewed -Continue breast cancer surveillance -Sw to discuss volunteer options -Follow-up in 6 months, or sooner if needed   All questions were answered. The patient knows to call the clinic with any problems, questions or concerns. No barriers to learning were detected.     Alla Feeling, NP 04/23/22

## 2022-04-24 ENCOUNTER — Other Ambulatory Visit: Payer: Self-pay

## 2022-04-26 ENCOUNTER — Other Ambulatory Visit: Payer: Self-pay

## 2022-04-27 ENCOUNTER — Other Ambulatory Visit: Payer: Self-pay

## 2022-05-14 DIAGNOSIS — H93292 Other abnormal auditory perceptions, left ear: Secondary | ICD-10-CM | POA: Diagnosis not present

## 2022-05-21 DIAGNOSIS — H903 Sensorineural hearing loss, bilateral: Secondary | ICD-10-CM | POA: Diagnosis not present

## 2022-10-20 NOTE — Progress Notes (Unsigned)
Patient Care Team: Merri Brunette, MD as PCP - General (Internal Medicine) Pershing Proud, RN as Oncology Nurse Navigator Donnelly Angelica, RN as Oncology Nurse Navigator Almond Lint, MD as Consulting Physician (General Surgery) Malachy Mood, MD as Consulting Physician (Hematology) Lonie Peak, MD as Attending Physician (Radiation Oncology)   CHIEF COMPLAINT: Follow up right breast cancer   Oncology History Overview Note  Cancer Staging Malignant neoplasm of upper-outer quadrant of right female breast [facility]) Staging form: Breast, AJCC 8th Edition - Clinical stage from 12/09/2018: Stage IIA (cT2, cN0, cM0, G3, ER-, PR-, HER2+) - Signed by Malachy Mood, MD on 12/15/2018 - Pathologic stage from 05/19/2019: No Stage Recommended (ypT0, pN0, cM0, GX, ER: Not Assessed, PR: Not Assessed, HER2: Not Assessed) - Signed by Malachy Mood, MD on 06/01/2019    Malignant neoplasm of upper-outer quadrant of right female breast  12/07/2018 Mammogram   Mammogram 12/07/18  IMPRESSION: 1. 3 cm mass in the 11 o'clock position of the right breast, 4 cm the nipple, measuring 3 x 2 x 2 cm, highly suspicious for breast carcinoma. No enlarged or abnormal right axillary lymph nodes.   12/09/2018 Cancer Staging   Staging form: Breast, AJCC 8th Edition - Clinical stage from 12/09/2018: Stage IIA (cT2, cN0, cM0, G3, ER-, PR-, HER2+) - Signed by Malachy Mood, MD on 12/15/2018   12/09/2018 Initial Biopsy   Diagnosis 12/09/18 Breast, right, needle core biopsy, 11 o'clock - INVASIVE DUCTAL CARCINOMA. - DUCTAL CARCINOMA IN SITU. - SEE COMMENT.   12/09/2018 Receptors her2   The tumor cells are POSITIVE for Her2 (3+). Estrogen Receptor: 0%, NEGATIVE Progesterone Receptor: 0%, NEGATIVE Proliferation Marker Ki67: 30%   12/15/2018 Initial Diagnosis   Malignant neoplasm of upper-outer quadrant of right female breast ([facility])   12/21/2018 Breast MRI   IMPRESSION: 1. Approximate 4 cm mass involving the UPPER RIGHT breast  with extension into the UPPER INNER QUADRANT and UPPER OUTER QUADRANT, predominantly located in the UPPER INNER QUADRANT, biopsy-proven invasive ductal carcinoma and DCIS. The mass abuts the chest wall though I cannot confirm pectoralis muscle invasion. 2. Superficial satellite mass versus pathologic intramammary lymph node in the UPPER OUTER QUADRANT of the RIGHT breast measuring approximately 1 cm, located LATERAL to the dominant mass. 3. No MRI evidence of malignancy involving the LEFT breast. 4. No pathologic lymphadenopathy otherwise.   12/24/2018 Pathology Results   Diagnosis Breast, right, needle core biopsy, upper outer quadrant, peripheral to the recently biopsy proven breast carcinoma - INVASIVE DUCTAL CARCINOMA. SEE COMMENT. Results: IMMUNOHISTOCHEMICAL AND MORPHOMETRIC ANALYSIS PERFORMED MANUALLY The tumor cells are POSITIVE for Her2 (3+). Estrogen Receptor: 0%, NEGATIVE Progesterone Receptor: 0%, NEGATIVE Proliferation Marker Ki67: 40%   01/06/2019 - 10/25/2019 Chemotherapy   Neoadjuvant chemotherapy TCHP beginning 01/06/19 completed 5 cycle of TCHP on 03/30/19.  She started maintenance Herceptin on 05/04/19. D/c Perjeta after 05/04/19 due to low benefit. Stopped Herceptin after 10/25/19 due to patient request.    04/15/2019 Breast MRI   IMPRESSION: 1. Interval resolution of the 2 enhancing masses corresponding with biopsy proven malignancy in the upper right breast. There is no residual or new, suspicious enhancement. 2. No MRI evidence of malignancy in the left breast.   04/15/2019 Echocardiogram   FINDINGS  Left Ventricle: Left ventricular ejection fraction, by visual estimation, is 55 to 60%. The left ventricle has normal function. There is no left ventricular hypertrophy. Normal left ventricular size. Spectral Doppler shows Left ventricular diastolic  parameters were normal pattern of LV diastolic filling. Compared  with the echo 12/22/18, systolic function is slightly worse,  especially in the anteroseptum. However overall strain is slightly better.     05/19/2019 Surgery   BREAST LUMPECTOMY WITH RADIOACTIVE SEED X2  AND SENTINEL LYMPH NODE BIOPSY by Dr. Donell Beers  05/19/19    05/19/2019 Pathology Results   FINAL MICROSCOPIC DIAGNOSIS:   A. BREAST, RIGHT LATERAL SATELLITE LESION, LUMPECTOMY:  - No residual invasive carcinoma status post neoadjuvant treatment.  - One intraparenchymal lymph node, negative for carcinoma (0/1).  - See oncology table.   B. BREAST, RIGHT DOMINANT MASS, LUMPECTOMY:  - No residual invasive carcinoma status post neoadjuvant treatment.  - Biopsy site changes.  - See oncology table.   C. SENTINEL LYMPH NODE, RIGHT AXILLARY, EXCISION:  - One lymph node, negative for carcinoma (0/1).   D. SENTINEL LYMPH NODE, RIGHT AXILLARY, EXCISION:  - One lymph node, negative for carcinoma (0/1).   E. SENTINEL LYMPH NODE, RIGHT AXILLARY, EXCISION:  - One lymph node, negative for carcinoma (0/1).   F. SENTINEL LYMPH NODE, RIGHT AXILLARY, EXCISION:  - One lymph node, negative for carcinoma (0/1).    05/19/2019 Cancer Staging   Staging form: Breast, AJCC 8th Edition - Pathologic stage from 05/19/2019: No Stage Recommended (ypT0, pN0, cM0, GX, ER: Not Assessed, PR: Not Assessed, HER2: Not Assessed) - Signed by Malachy Mood, MD on 06/01/2019   06/16/2019 - 07/15/2019 Radiation Therapy   Adjuvant Radiation with Dr. Basilio Cairo  06/16/2019 through 07/15/2019      CURRENT THERAPY: Surveillance   INTERVAL HISTORY Ms. Elbe returns for follow up as scheduled. Last seen by me 04/23/22.   ROS   Past Medical History:  Diagnosis Date   Allergy    Anemia    Asthma    as a child    Cancer ([facility])    Breast-R   Chronic kidney disease    Heart murmur    MVP   History of kidney stones     x 3 passed 1   History of radiation therapy 06/16/19- 07/15/19   Right Breast 15 fractions of 2.67 Gy to total 40.05 Gy. Right Breast boost 5 fractions of 2 Gy to total  10 Gy   Hyperlipidemia    Hypertension    Kidney stone    Mitral valve prolapse    Osteoporosis      Past Surgical History:  Procedure Laterality Date   BREAST BIOPSY Left 12/27/2015   benign   BREAST BIOPSY Right 07/26/2013   benign   BREAST EXCISIONAL BIOPSY Right 09/08/2013   high risk    BREAST LUMPECTOMY WITH RADIOACTIVE SEED AND SENTINEL LYMPH NODE BIOPSY Right 05/19/2019   Procedure: BREAST LUMPECTOMY WITH RADIOACTIVE SEED X2  AND SENTINEL LYMPH NODE BIOPSY;  Surgeon: Almond Lint, MD;  Location: [facility];  Service: General;  Laterality: Right;   COLONOSCOPY W/ POLYPECTOMY     CYSTOSCOPY     PORT-A-CATH REMOVAL Left 12/07/2019   Procedure: REMOVAL PORT-A-CATH;  Surgeon: Almond Lint, MD;  Location: [facility];  Service: General;  Laterality: Left;   PORTACATH PLACEMENT N/A 12/28/2018   Procedure: INSERTION PORT-A-CATH;  Surgeon: Almond Lint, MD;  Location: [facility];  Service: General;  Laterality: N/A;   SHOULDER SURGERY Right    frozen shoulder and bone spur     Outpatient Encounter Medications as of 10/22/2022  Medication Sig   atorvastatin (LIPITOR) 20 MG tablet Take 20 mg by mouth at bedtime.    Facility-Administered Encounter Medications as of 10/22/2022  Medication  0.9 %  sodium chloride infusion     There were no vitals filed for this visit. There is no height or weight on file to calculate BMI.   PHYSICAL EXAM GENERAL:alert, no distress and comfortable SKIN: no rash  EYES: sclera clear NECK: without mass LYMPH:  no palpable cervical or supraclavicular lymphadenopathy  LUNGS: clear with normal breathing effort HEART: regular rate & rhythm, no lower extremity edema ABDOMEN: abdomen soft, non-tender and normal bowel sounds NEURO: alert & oriented x 3 with fluent speech, no focal motor/sensory deficits Breast exam:  PAC without erythema    CBC    Component Value Date/Time   WBC 4.6 04/23/2022 0956   WBC 3.0 (L) 05/14/2019 1001   RBC 4.47 04/23/2022 0956    HGB 13.9 04/23/2022 0956   HCT 41.5 04/23/2022 0956   PLT 188 04/23/2022 0956   MCV 92.8 04/23/2022 0956   MCH 31.1 04/23/2022 0956   MCHC 33.5 04/23/2022 0956   RDW 12.6 04/23/2022 0956   LYMPHSABS 1.3 04/23/2022 0956   MONOABS 0.5 04/23/2022 0956   EOSABS 0.1 04/23/2022 0956   BASOSABS 0.0 04/23/2022 0956     CMP     Component Value Date/Time   NA 141 04/23/2022 0956   K 3.8 04/23/2022 0956   CL 105 04/23/2022 0956   CO2 30 04/23/2022 0956   GLUCOSE 106 (H) 04/23/2022 0956   BUN 20 04/23/2022 0956   CREATININE 0.92 04/23/2022 0956   CALCIUM 9.8 04/23/2022 0956   PROT 7.0 04/23/2022 0956   ALBUMIN 4.6 04/23/2022 0956   AST 16 04/23/2022 0956   ALT 13 04/23/2022 0956   ALKPHOS 45 04/23/2022 0956   BILITOT 0.8 04/23/2022 0956   GFRNONAA >60 04/23/2022 0956   GFRAA >60 10/27/2019 0944     ASSESSMENT & PLAN:Ronald G Moes is a 72 y.o. female   1. 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  S/p 5/6 cycles of neoadjuvant TCHP and only 5 months of maintenance Herceptin per patient request.  She underwent right lumpectomy by Dr. Donell Beers 05/19/2019 with a complete pathologic response, and completed adjuvant radiation. -Due to ER/PR negative disease, she would not benefit from antiestrogen therapy -On surveillance, bilateral mammogram 12/31/2021 negative for malignancy, density cat C    2. Basal skin cancer: R nares, L posterior neck base/upper back, L leg -[facility], she has been referred to derm -Reviewed skin protection   3. Shingles to left buttock -She developed shingles outbreak on her buttock after cycle 5 TCHP dosed on 03/30/19, s/p valacyclovir  -Resolved   4.  Bone health  -Continue vitamin D, calcium-rich diet, and weightbearing exercise -Continue to recommend routine DEXA screening, she will discuss with her PCP   5.  Social and financial support  -she is single, lives alone, no children.  She has friend social support ands strong faith in God. Our chaplain has reached out to her -She is interested in volunteering at the cancer center, I will ask social work to contact her   6.  Health maintenance -She completed 4 COVID vaccines but declined a flu shot in Fall 2022 flu season -Last colonoscopy 12/2017 by Dr. Lavon Paganini which showed hemorrhoids.  She has had intermittent hemorrhoidal bleeding in the past. Next due 12/2022 -Encouraged her to continue healthy active lifestyle and age-appropriate health maintenance      PLAN:  No orders of  the defined types were placed in this encounter.     All questions were answered. The patient knows to call the clinic with any problems, questions or concerns. No barriers to learning were detected. I spent *** counseling the patient face to face. The total time spent in the appointment was *** and more than 50% was on counseling, review of test results, and coordination of care.   Santiago Glad, NP-C @

## 2022-10-22 ENCOUNTER — Other Ambulatory Visit: Payer: Self-pay

## 2022-10-22 ENCOUNTER — Encounter: Payer: Self-pay | Admitting: Hematology

## 2022-10-22 ENCOUNTER — Inpatient Hospital Stay: Payer: No Typology Code available for payment source | Attending: Nurse Practitioner

## 2022-10-22 ENCOUNTER — Encounter: Payer: Self-pay | Admitting: Nurse Practitioner

## 2022-10-22 ENCOUNTER — Inpatient Hospital Stay (HOSPITAL_BASED_OUTPATIENT_CLINIC_OR_DEPARTMENT_OTHER): Payer: No Typology Code available for payment source | Admitting: Nurse Practitioner

## 2022-10-22 VITALS — BP 141/81 | HR 74 | Temp 98.2°F | Resp 18 | Ht 63.0 in | Wt 100.1 lb

## 2022-10-22 DIAGNOSIS — Z171 Estrogen receptor negative status [ER-]: Secondary | ICD-10-CM

## 2022-10-22 DIAGNOSIS — C50411 Malignant neoplasm of upper-outer quadrant of right female breast: Secondary | ICD-10-CM

## 2022-10-22 DIAGNOSIS — Z9221 Personal history of antineoplastic chemotherapy: Secondary | ICD-10-CM | POA: Diagnosis not present

## 2022-10-22 DIAGNOSIS — Z853 Personal history of malignant neoplasm of breast: Secondary | ICD-10-CM | POA: Insufficient documentation

## 2022-10-22 DIAGNOSIS — D72819 Decreased white blood cell count, unspecified: Secondary | ICD-10-CM | POA: Insufficient documentation

## 2022-10-22 DIAGNOSIS — Z923 Personal history of irradiation: Secondary | ICD-10-CM | POA: Diagnosis not present

## 2022-10-22 LAB — CBC WITH DIFFERENTIAL (CANCER CENTER ONLY)
Abs Immature Granulocytes: 0.01 10*3/uL (ref 0.00–0.07)
Basophils Absolute: 0 10*3/uL (ref 0.0–0.1)
Basophils Relative: 1 %
Eosinophils Absolute: 0 10*3/uL (ref 0.0–0.5)
Eosinophils Relative: 1 %
HCT: 41 % (ref 36.0–46.0)
Hemoglobin: 14 g/dL (ref 12.0–15.0)
Immature Granulocytes: 0 %
Lymphocytes Relative: 26 %
Lymphs Abs: 1 10*3/uL (ref 0.7–4.0)
MCH: 31.4 pg (ref 26.0–34.0)
MCHC: 34.1 g/dL (ref 30.0–36.0)
MCV: 91.9 fL (ref 80.0–100.0)
Monocytes Absolute: 0.4 10*3/uL (ref 0.1–1.0)
Monocytes Relative: 11 %
Neutro Abs: 2.3 10*3/uL (ref 1.7–7.7)
Neutrophils Relative %: 61 %
Platelet Count: 187 10*3/uL (ref 150–400)
RBC: 4.46 MIL/uL (ref 3.87–5.11)
RDW: 12.9 % (ref 11.5–15.5)
WBC Count: 3.8 10*3/uL — ABNORMAL LOW (ref 4.0–10.5)
nRBC: 0 % (ref 0.0–0.2)

## 2022-10-22 LAB — CMP (CANCER CENTER ONLY)
ALT: 20 U/L (ref 0–44)
AST: 22 U/L (ref 15–41)
Albumin: 4.7 g/dL (ref 3.5–5.0)
Alkaline Phosphatase: 49 U/L (ref 38–126)
Anion gap: 6 (ref 5–15)
BUN: 16 mg/dL (ref 8–23)
CO2: 30 mmol/L (ref 22–32)
Calcium: 10 mg/dL (ref 8.9–10.3)
Chloride: 104 mmol/L (ref 98–111)
Creatinine: 0.95 mg/dL (ref 0.44–1.00)
GFR, Estimated: >60 mL/min (ref >60)
Glucose, Bld: 101 mg/dL — ABNORMAL HIGH (ref 70–99)
Potassium: 3.7 mmol/L (ref 3.5–5.1)
Sodium: 140 mmol/L (ref 135–145)
Total Bilirubin: 0.9 mg/dL (ref 0.3–1.2)
Total Protein: 7.2 g/dL (ref 6.5–8.1)

## 2022-11-06 ENCOUNTER — Encounter: Payer: Self-pay | Admitting: Hematology

## 2022-12-09 DIAGNOSIS — H2513 Age-related nuclear cataract, bilateral: Secondary | ICD-10-CM | POA: Diagnosis not present

## 2023-01-06 ENCOUNTER — Ambulatory Visit: Admission: RE | Admit: 2023-01-06 | Payer: Medicare HMO | Source: Ambulatory Visit

## 2023-01-06 DIAGNOSIS — Z853 Personal history of malignant neoplasm of breast: Secondary | ICD-10-CM | POA: Diagnosis not present

## 2023-01-06 DIAGNOSIS — Z171 Estrogen receptor negative status [ER-]: Secondary | ICD-10-CM

## 2023-01-15 DIAGNOSIS — E7800 Pure hypercholesterolemia, unspecified: Secondary | ICD-10-CM | POA: Diagnosis not present

## 2023-01-15 DIAGNOSIS — I10 Essential (primary) hypertension: Secondary | ICD-10-CM | POA: Diagnosis not present

## 2023-01-15 DIAGNOSIS — E559 Vitamin D deficiency, unspecified: Secondary | ICD-10-CM | POA: Diagnosis not present

## 2023-01-15 DIAGNOSIS — R319 Hematuria, unspecified: Secondary | ICD-10-CM | POA: Diagnosis not present

## 2023-01-15 DIAGNOSIS — R5383 Other fatigue: Secondary | ICD-10-CM | POA: Diagnosis not present

## 2023-01-22 ENCOUNTER — Encounter: Payer: Self-pay | Admitting: Gastroenterology

## 2023-01-22 DIAGNOSIS — Z0000 Encounter for general adult medical examination without abnormal findings: Secondary | ICD-10-CM | POA: Diagnosis not present

## 2023-01-22 DIAGNOSIS — E7800 Pure hypercholesterolemia, unspecified: Secondary | ICD-10-CM | POA: Diagnosis not present

## 2023-01-22 DIAGNOSIS — C50911 Malignant neoplasm of unspecified site of right female breast: Secondary | ICD-10-CM | POA: Diagnosis not present

## 2023-01-22 DIAGNOSIS — Z800 Family history of malignant neoplasm of digestive organs: Secondary | ICD-10-CM | POA: Diagnosis not present

## 2023-01-22 DIAGNOSIS — E559 Vitamin D deficiency, unspecified: Secondary | ICD-10-CM | POA: Diagnosis not present

## 2023-01-22 DIAGNOSIS — R636 Underweight: Secondary | ICD-10-CM | POA: Diagnosis not present

## 2023-01-22 DIAGNOSIS — Z8249 Family history of ischemic heart disease and other diseases of the circulatory system: Secondary | ICD-10-CM | POA: Diagnosis not present

## 2023-02-04 DIAGNOSIS — H25811 Combined forms of age-related cataract, right eye: Secondary | ICD-10-CM | POA: Diagnosis not present

## 2023-02-06 ENCOUNTER — Observation Stay (HOSPITAL_COMMUNITY)
Admission: EM | Admit: 2023-02-06 | Discharge: 2023-02-07 | Disposition: A | Discharge status: Home / Self Care | Payer: No Typology Code available for payment source | Attending: Emergency Medicine | Admitting: Emergency Medicine

## 2023-02-06 ENCOUNTER — Other Ambulatory Visit: Payer: Self-pay

## 2023-02-06 ENCOUNTER — Emergency Department (HOSPITAL_COMMUNITY): Payer: No Typology Code available for payment source

## 2023-02-06 ENCOUNTER — Observation Stay (HOSPITAL_COMMUNITY): Payer: No Typology Code available for payment source

## 2023-02-06 ENCOUNTER — Encounter (HOSPITAL_COMMUNITY): Payer: Self-pay | Admitting: Internal Medicine

## 2023-02-06 DIAGNOSIS — Z87891 Personal history of nicotine dependence: Secondary | ICD-10-CM | POA: Insufficient documentation

## 2023-02-06 DIAGNOSIS — R000 Tachycardia, unspecified: Secondary | ICD-10-CM | POA: Diagnosis not present

## 2023-02-06 DIAGNOSIS — E7800 Pure hypercholesterolemia, unspecified: Secondary | ICD-10-CM | POA: Diagnosis not present

## 2023-02-06 DIAGNOSIS — C50911 Malignant neoplasm of unspecified site of right female breast: Secondary | ICD-10-CM | POA: Diagnosis not present

## 2023-02-06 DIAGNOSIS — J45909 Unspecified asthma, uncomplicated: Secondary | ICD-10-CM | POA: Insufficient documentation

## 2023-02-06 DIAGNOSIS — C50919 Malignant neoplasm of unspecified site of unspecified female breast: Secondary | ICD-10-CM | POA: Insufficient documentation

## 2023-02-06 DIAGNOSIS — E876 Hypokalemia: Secondary | ICD-10-CM | POA: Diagnosis not present

## 2023-02-06 DIAGNOSIS — N189 Chronic kidney disease, unspecified: Secondary | ICD-10-CM | POA: Diagnosis not present

## 2023-02-06 DIAGNOSIS — Z79899 Other long term (current) drug therapy: Secondary | ICD-10-CM | POA: Diagnosis not present

## 2023-02-06 DIAGNOSIS — Z9849 Cataract extraction status, unspecified eye: Secondary | ICD-10-CM | POA: Insufficient documentation

## 2023-02-06 DIAGNOSIS — I129 Hypertensive chronic kidney disease with stage 1 through stage 4 chronic kidney disease, or unspecified chronic kidney disease: Secondary | ICD-10-CM | POA: Diagnosis not present

## 2023-02-06 DIAGNOSIS — R112 Nausea with vomiting, unspecified: Secondary | ICD-10-CM | POA: Diagnosis not present

## 2023-02-06 DIAGNOSIS — I10 Essential (primary) hypertension: Secondary | ICD-10-CM | POA: Diagnosis present

## 2023-02-06 DIAGNOSIS — R4182 Altered mental status, unspecified: Secondary | ICD-10-CM | POA: Diagnosis present

## 2023-02-06 DIAGNOSIS — D61818 Other pancytopenia: Secondary | ICD-10-CM | POA: Insufficient documentation

## 2023-02-06 DIAGNOSIS — K219 Gastro-esophageal reflux disease without esophagitis: Secondary | ICD-10-CM | POA: Diagnosis present

## 2023-02-06 DIAGNOSIS — G9341 Metabolic encephalopathy: Secondary | ICD-10-CM | POA: Diagnosis not present

## 2023-02-06 DIAGNOSIS — Z800 Family history of malignant neoplasm of digestive organs: Secondary | ICD-10-CM

## 2023-02-06 DIAGNOSIS — I341 Nonrheumatic mitral (valve) prolapse: Secondary | ICD-10-CM | POA: Insufficient documentation

## 2023-02-06 DIAGNOSIS — R110 Nausea: Secondary | ICD-10-CM | POA: Diagnosis not present

## 2023-02-06 DIAGNOSIS — I130 Hypertensive heart and chronic kidney disease with heart failure and stage 1 through stage 4 chronic kidney disease, or unspecified chronic kidney disease: Secondary | ICD-10-CM | POA: Diagnosis not present

## 2023-02-06 DIAGNOSIS — R1111 Vomiting without nausea: Secondary | ICD-10-CM | POA: Diagnosis not present

## 2023-02-06 DIAGNOSIS — Z743 Need for continuous supervision: Secondary | ICD-10-CM | POA: Diagnosis not present

## 2023-02-06 DIAGNOSIS — Z85828 Personal history of other malignant neoplasm of skin: Secondary | ICD-10-CM | POA: Insufficient documentation

## 2023-02-06 DIAGNOSIS — R413 Other amnesia: Principal | ICD-10-CM

## 2023-02-06 DIAGNOSIS — I5030 Unspecified diastolic (congestive) heart failure: Secondary | ICD-10-CM | POA: Insufficient documentation

## 2023-02-06 DIAGNOSIS — R410 Disorientation, unspecified: Secondary | ICD-10-CM

## 2023-02-06 DIAGNOSIS — I5189 Other ill-defined heart diseases: Secondary | ICD-10-CM

## 2023-02-06 DIAGNOSIS — M810 Age-related osteoporosis without current pathological fracture: Secondary | ICD-10-CM | POA: Insufficient documentation

## 2023-02-06 LAB — CBC WITH DIFFERENTIAL/PLATELET
Abs Immature Granulocytes: 0.02 10*3/uL (ref 0.00–0.07)
Basophils Absolute: 0 10*3/uL (ref 0.0–0.1)
Basophils Relative: 1 %
Eosinophils Absolute: 0 10*3/uL (ref 0.0–0.5)
Eosinophils Relative: 1 %
HCT: 43.9 % (ref 36.0–46.0)
Hemoglobin: 14.2 g/dL (ref 12.0–15.0)
Immature Granulocytes: 0 %
Lymphocytes Relative: 6 %
Lymphs Abs: 0.5 10*3/uL — ABNORMAL LOW (ref 0.7–4.0)
MCH: 30.6 pg (ref 26.0–34.0)
MCHC: 32.3 g/dL (ref 30.0–36.0)
MCV: 94.6 fL (ref 80.0–100.0)
Monocytes Absolute: 0.7 10*3/uL (ref 0.1–1.0)
Monocytes Relative: 9 %
Neutro Abs: 6.9 10*3/uL (ref 1.7–7.7)
Neutrophils Relative %: 83 %
Platelets: 163 10*3/uL (ref 150–400)
RBC: 4.64 MIL/uL (ref 3.87–5.11)
RDW: 12.8 % (ref 11.5–15.5)
WBC: 8.3 10*3/uL (ref 4.0–10.5)
nRBC: 0 % (ref 0.0–0.2)

## 2023-02-06 LAB — RAPID URINE DRUG SCREEN, HOSP PERFORMED
Amphetamines: NOT DETECTED
Barbiturates: NOT DETECTED
Benzodiazepines: NOT DETECTED
Cocaine: NOT DETECTED
Opiates: NOT DETECTED
Tetrahydrocannabinol: NOT DETECTED

## 2023-02-06 LAB — URINALYSIS, ROUTINE W REFLEX MICROSCOPIC
Bacteria, UA: NONE SEEN
Bilirubin Urine: NEGATIVE
Glucose, UA: NEGATIVE mg/dL
Ketones, ur: NEGATIVE mg/dL
Leukocytes,Ua: NEGATIVE
Nitrite: NEGATIVE
Protein, ur: NEGATIVE mg/dL
Specific Gravity, Urine: 1.012 (ref 1.005–1.030)
pH: 6 (ref 5.0–8.0)

## 2023-02-06 LAB — COMPREHENSIVE METABOLIC PANEL
ALT: 24 U/L (ref 0–44)
AST: 24 U/L (ref 15–41)
Albumin: 4.3 g/dL (ref 3.5–5.0)
Alkaline Phosphatase: 51 U/L (ref 38–126)
Anion gap: 10 (ref 5–15)
BUN: 17 mg/dL (ref 8–23)
CO2: 26 mmol/L (ref 22–32)
Calcium: 9.4 mg/dL (ref 8.9–10.3)
Chloride: 106 mmol/L (ref 98–111)
Creatinine, Ser: 0.73 mg/dL (ref 0.44–1.00)
GFR, Estimated: >60 mL/min (ref >60)
Glucose, Bld: 104 mg/dL — ABNORMAL HIGH (ref 70–99)
Potassium: 3.4 mmol/L — ABNORMAL LOW (ref 3.5–5.1)
Sodium: 142 mmol/L (ref 135–145)
Total Bilirubin: 0.9 mg/dL (ref 0.3–1.2)
Total Protein: 6.9 g/dL (ref 6.5–8.1)

## 2023-02-06 LAB — SALICYLATE LEVEL: Salicylate Lvl: <7 mg/dL — ABNORMAL LOW (ref 7.0–30.0)

## 2023-02-06 LAB — ETHANOL: Alcohol, Ethyl (B): <10 mg/dL (ref <10)

## 2023-02-06 LAB — CK: Total CK: 64 U/L (ref 38–234)

## 2023-02-06 LAB — TSH: TSH: 2.684 u[IU]/mL (ref 0.350–4.500)

## 2023-02-06 LAB — CBG MONITORING, ED: Glucose-Capillary: 84 mg/dL (ref 70–99)

## 2023-02-06 LAB — AMMONIA: Ammonia: 15 umol/L (ref 9–35)

## 2023-02-06 LAB — ACETAMINOPHEN LEVEL: Acetaminophen (Tylenol), Serum: <10 ug/mL — ABNORMAL LOW (ref 10–30)

## 2023-02-06 MED ORDER — ATORVASTATIN CALCIUM 10 MG PO TABS
20.0000 mg | ORAL_TABLET | Freq: Every day | ORAL | Status: DC
  Administered 2023-02-06 – 2023-02-07 (×2): 20 mg via ORAL
  Filled 2023-02-06 (×2): qty 2

## 2023-02-06 MED ORDER — SODIUM CHLORIDE 0.9 % IV SOLN
500.0000 mL | Freq: Once | INTRAVENOUS | Status: AC
  Administered 2023-02-06: 500 mL via INTRAVENOUS

## 2023-02-06 MED ORDER — KETOROLAC TROMETHAMINE 0.5 % OP SOLN
1.0000 [drp] | Freq: Four times a day (QID) | OPHTHALMIC | Status: DC
  Administered 2023-02-06 – 2023-02-07 (×2): 1 [drp] via OPHTHALMIC
  Filled 2023-02-06 (×2): qty 5

## 2023-02-06 MED ORDER — ENOXAPARIN SODIUM 30 MG/0.3ML IJ SOSY
30.0000 mg | PREFILLED_SYRINGE | INTRAMUSCULAR | Status: DC
  Administered 2023-02-06: 30 mg via SUBCUTANEOUS
  Filled 2023-02-06: qty 0.3

## 2023-02-06 MED ORDER — LORAZEPAM 1 MG PO TABS
1.0000 mg | ORAL_TABLET | Freq: Once | ORAL | Status: AC
  Administered 2023-02-06: 1 mg via ORAL
  Filled 2023-02-06: qty 1

## 2023-02-06 MED ORDER — POTASSIUM CHLORIDE IN NACL 40-0.9 MEQ/L-% IV SOLN
INTRAVENOUS | Status: AC
  Filled 2023-02-06: qty 1000

## 2023-02-06 MED ORDER — PANTOPRAZOLE SODIUM 40 MG PO TBEC
40.0000 mg | DELAYED_RELEASE_TABLET | Freq: Every day | ORAL | Status: DC
  Administered 2023-02-06 – 2023-02-07 (×2): 40 mg via ORAL
  Filled 2023-02-06 (×2): qty 1

## 2023-02-06 MED ORDER — PREDNISOLONE ACETATE 1 % OP SUSP
1.0000 [drp] | Freq: Four times a day (QID) | OPHTHALMIC | Status: DC
  Administered 2023-02-06 – 2023-02-07 (×2): 1 [drp] via OPHTHALMIC
  Filled 2023-02-06 (×2): qty 5

## 2023-02-06 MED ORDER — POTASSIUM CHLORIDE CRYS ER 20 MEQ PO TBCR
20.0000 meq | EXTENDED_RELEASE_TABLET | Freq: Once | ORAL | Status: AC
  Administered 2023-02-06: 20 meq via ORAL
  Filled 2023-02-06: qty 2

## 2023-02-06 MED ORDER — OFLOXACIN 0.3 % OP SOLN
1.0000 [drp] | Freq: Four times a day (QID) | OPHTHALMIC | Status: DC
  Administered 2023-02-06 – 2023-02-07 (×4): 1 [drp] via OPHTHALMIC
  Filled 2023-02-06 (×2): qty 5

## 2023-02-06 MED ORDER — POTASSIUM CHLORIDE IN NACL 20-0.9 MEQ/L-% IV SOLN
INTRAVENOUS | Status: DC
  Filled 2023-02-06 (×2): qty 1000

## 2023-02-06 NOTE — ED Triage Notes (Signed)
Pt BIBA from home, found on the floor by friend vomiting and diarrhea also significant confusion. No hx of confusion. Had cataract surgery Tuesday. Denies CP, SHOB, abd pain. Stroke screen neg. 250cc LR, 4mg  zofran 22ga LW  149/74 HR 93 97% RA Temp 97.1 CBG 122

## 2023-02-06 NOTE — ED Notes (Signed)
Pt was able to ambulate to restroom with little to no assistant.

## 2023-02-06 NOTE — H&P (Signed)
History and Physical    Patient: Christina Sexton WUJ:[id number] DOB: 1950/08/05 DOA: 02/06/2023 DOS: the patient was seen and examined on 02/06/2023 PCP: Merri Brunette, MD  Patient coming from: Home  Chief Complaint:  Chief Complaint  Patient presents with   Altered Mental Status   HPI: Christina VILLAGRANA is a 72 y.o. female with medical history significant of seasonal allergies, anemia, childhood asthma, right breast cancer, history of radiation therapy, skin cancer, chronic kidney disease, grade 1 diastolic dysfunction, mitral valve prolapse, nephrolithiasis, osteoporosis, status post cataract surgery on Tuesday who was brought via EMS to the emergency department after her neighbor found her on the floor very confused after the patient have several episodes of nausea, vomiting and diarrhea.  Overnight, her mental status is back to baseline.  She does not have much recollection of what happened.  She ate some salmon and green beans.  No sick contacts or travel history.  No abdominal pain constipation, melena or hematochezia.  No flank pain, dysuria, frequency or hematuria. She denied fever, chills, rhinorrhea, sore throat, wheezing or hemoptysis.  No chest pain, palpitations, diaphoresis, PND, orthopnea or pitting edema of the lower extremities.  No polyuria, polydipsia, polyphagia or blurred vision.   Lab work:  Urine analysis showed moderate hemoglobin, but otherwise unremarkable.  UDS was negative.  CBC showed a white count of 8.30, hemoglobin 14.2 g/dL and platelets 956.  Unremarkable TSH, alcohol, acetaminophen, salicylate, total CK and ammonia level.  CMP showed a potassium of 3.4 mmol/L and glucose of 104 mg deciliter.  The rest of the CMP measurements were normal.  Imaging: Two-view chest radiograph with no acute cardiopulmonary abnormality.  CT head without contrast with no acute traumatic injury.  Normal noncontrast CT appearance of the brain for age.  MRI of the brain without contrast showed  normal brain MRI with no evidence of transient global amnesia or other acute intracranial pathology.  ED course: Initial vital signs were temperature 98.2 F, pulse 86, respirations 16, BP 156/70 mmHg O2 sat 96% on room air.  The patient received lorazepam 1 mg p.o. x 1 for MRI imaging.   Review of Systems: As mentioned in the history of present illness. All other systems reviewed and are negative.  Past Medical History:  Diagnosis Date   Allergy    Anemia    Asthma    as a child    Cancer (HCC)    Breast-R   Chronic kidney disease    Heart murmur    MVP   History of kidney stones     x 3 passed 1   History of radiation therapy 06/16/19- 07/15/19   Right Breast 15 fractions of 2.67 Gy to total 40.05 Gy. Right Breast boost 5 fractions of 2 Gy to total 10 Gy   Hyperlipidemia    Hypertension    Kidney stone    Mitral valve prolapse    Osteoporosis    Past Surgical History:  Procedure Laterality Date   BREAST BIOPSY Left 12/27/2015   benign   BREAST BIOPSY Right 07/26/2013   benign   BREAST EXCISIONAL BIOPSY Right 09/08/2013   high risk    BREAST LUMPECTOMY WITH RADIOACTIVE SEED AND SENTINEL LYMPH NODE BIOPSY Right 05/19/2019   Procedure: BREAST LUMPECTOMY WITH RADIOACTIVE SEED X2  AND SENTINEL LYMPH NODE BIOPSY;  Surgeon: Almond Lint, MD;  Location: [facility] OR;  Service: General;  Laterality: Right;   COLONOSCOPY W/ POLYPECTOMY     CYSTOSCOPY     PORT-A-CATH REMOVAL  Left 12/07/2019   Procedure: REMOVAL PORT-A-CATH;  Surgeon: Almond Lint, MD;  Location: [facility] OR;  Service: General;  Laterality: Left;   PORTACATH PLACEMENT N/A 12/28/2018   Procedure: INSERTION PORT-A-CATH;  Surgeon: Almond Lint, MD;  Location: [facility] ORS;  Service: General;  Laterality: N/A;   SHOULDER SURGERY Right    frozen shoulder and bone spur   Social History:  reports that she has quit smoking. She has never used smokeless tobacco. She reports current alcohol use. She reports that she does not use  drugs.  Allergies  Allergen Reactions   Oxycodone Other (See Comments)    "kept me awake" "talked out of my head"    Family History  Problem Relation Age of Onset   Colon cancer Brother    COPD Mother    Diabetes Father    Esophageal cancer Neg Hx    Liver cancer Neg Hx    Pancreatic cancer Neg Hx    Rectal cancer Neg Hx    Stomach cancer Neg Hx     Prior to Admission medications   Medication Sig Start Date End Date Taking? Authorizing Provider  ketorolac (ACULAR) 0.5 % ophthalmic solution Place 1 drop into the right eye 4 (four) times daily. 01/28/23  Yes [provider]  ofloxacin (OCUFLOX) 0.3 % ophthalmic solution Place 1 drop into the right eye 4 (four) times daily. 01/28/23  Yes [provider]  prednisoLONE acetate (PRED FORTE) 1 % ophthalmic suspension Place 1 drop into the right eye 4 (four) times daily. 01/28/23  Yes [provider]  atorvastatin (LIPITOR) 20 MG tablet Take 20 mg by mouth at bedtime.     [provider]    Physical Exam: Vitals:   02/06/23 0443 02/06/23 0447 02/06/23 0515 02/06/23 0722  BP: (!) 156/70  (!) 151/78 (!) 116/58  Pulse: 86  100 89  Resp: 16  (!) 28 16  Temp: 98.2 F (36.8 C)   98.2 F (36.8 C)  TempSrc: Oral   Oral  SpO2: 96%  100% 94%  Height:  5\' 3"  (1.6 m)     Physical Exam Vitals and nursing note reviewed.  Constitutional:      General: She is awake. She is not in acute distress.    Appearance: Normal appearance.  HENT:     Head: Normocephalic.     Nose: No rhinorrhea.     Mouth/Throat:     Mouth: Mucous membranes are dry.  Eyes:     General: No scleral icterus.    Pupils: Pupils are equal, round, and reactive to light.  Neck:     Vascular: No JVD.  Cardiovascular:     Rate and Rhythm: Normal rate and regular rhythm.     Heart sounds: S1 normal and S2 normal.  Pulmonary:     Effort: Pulmonary effort is normal.     Breath sounds: Normal breath sounds.  Abdominal:     General:  Bowel sounds are normal.     Palpations: Abdomen is soft.  Musculoskeletal:     Cervical back: Neck supple.     Right lower leg: No edema.     Left lower leg: No edema.  Skin:    General: Skin is warm and dry.  Neurological:     General: No focal deficit present.     Mental Status: She is alert and oriented to person, place, and time.  Psychiatric:        Mood and Affect: Mood normal.  Behavior: Behavior normal. Behavior is cooperative.     Data Reviewed:  There are no new results to review at this time. 08/18/2019 transthoracic echocardiogram. IMPRESSIONS:   1. Left ventricular ejection fraction, by estimation, is 55 to 60%. The  left ventricle has normal function. The left ventricle has no regional  wall motion abnormalities. Left ventricular diastolic parameters are  consistent with Grade I diastolic  dysfunction (impaired relaxation). The average left ventricular global  longitudinal strain is -10.0 %.   2. Right ventricular systolic function is normal. The right ventricular  size is normal. Tricuspid regurgitation signal is inadequate for assessing  PA pressure.   3. The mitral valve is myxomatous. Trivial mitral valve regurgitation. No  evidence of mitral stenosis.   4. The aortic valve is normal in structure and function. Aortic valve  regurgitation is not visualized. No aortic stenosis is present.   5. The inferior vena cava is normal in size with greater than 50%  respiratory variability, suggesting right atrial pressure of 3 mmHg.   EKG: Vent. rate 84 BPM PR interval 155 ms QRS duration 88 ms QT/QTcB 380/450 ms P-R-T axes 84 79 46 Sinus rhythm Anterior infarct, old  Assessment and Plan: Principal Problem:   Acute metabolic encephalopathy In the setting of:   Nausea vomiting and diarrhea Observation/MedSurg. Continue IV fluids. Neurochecks every shift. Antiemetics as needed. Pantoprazole 40 mg p.o. daily. Follow CBC, CMP and lipase in  AM.  Active Problems:   Hypokalemia Replacing. Follow-up potassium level.    GERD Pantoprazole 40 mg p.o. daily while in the hospital.    Benign hypertension Normal weight. Currently not on medications.    Hypercholesterolemia Continue atorvastatin 20 mg p.o. daily.    Grade I diastolic dysfunction Seems to be mildly volume depleted.    Advance Care Planning:   Code Status: Full Code   Consults:   Family Communication:   Severity of Illness: The appropriate patient status for this patient is OBSERVATION. Observation status is judged to be reasonable and necessary in order to provide the required intensity of service to ensure the patient's safety. The patient's presenting symptoms, physical exam findings, and initial radiographic and laboratory data in the context of their medical condition is felt to place them at decreased risk for further clinical deterioration. Furthermore, it is anticipated that the patient will be medically stable for discharge from the hospital within 2 midnights of admission.   Author: Bobette Mo, MD 02/06/2023 7:43 AM  For on call review [email or web address].   This document was prepared using Dragon voice recognition software and may contain some unintended transcription errors.

## 2023-02-06 NOTE — ED Triage Notes (Signed)
Spoke to friend that pt has been staying with. Reports she and pt had a normal day yesterday and pt has been using eye drops as prescribed. Yesterday had lunch at [employer], leftovers for dinner. Went to bed and pt was fine. She report pt texted her from the bathroom around 1am saying she was vomiting. Friend stayed with pt until calling ems around 3am. States pt hardly drinks alcohol. Reports pt having some throat burning from one of the eye drops

## 2023-02-06 NOTE — Plan of Care (Signed)

## 2023-02-06 NOTE — ED Provider Notes (Signed)
[place] EMERGENCY DEPARTMENT AT [facility] Provider Note   CSN: [id number] Arrival date & time: 02/06/23  4098     History  Chief Complaint  Patient presents with   Altered Mental Status    Christina Sexton is a 72 y.o. female.  Level 5 caveat for confusion.  Patient brought in by ambulance from home with altered mental status.  She was apparently found on the floor by her friend who she is staying with her after having eye surgery 2 days ago.  Friend reported she was having vomiting and diarrhea and seemed confused.  Unclear last seen normal but was earlier last evening.  Patient realizes she is confused and feels it has getting worse over the past several days.  She is oriented to person and place.  She denies any pain.  She does not recall going to bed or eating dinner.  She has had repetitive questioning for EMS.  Denies headache, chest pain, shortness of breath, abdominal pain.  Notes pain with urination or blood in the urine.  No chest pain or shortness of breath.  States her only medication is atorvastatin besides eyedrops.  No fever.  States her vision is at baseline.  The history is provided by the patient and the EMS personnel.  Altered Mental Status Associated symptoms: no abdominal pain, no fever, no headaches, no nausea, no rash, no vomiting and no weakness        Home Medications Prior to Admission medications   Medication Sig Start Date End Date Taking? Authorizing Provider  atorvastatin (LIPITOR) 20 MG tablet Take 20 mg by mouth at bedtime.     [provider]      Allergies    Oxycodone    Review of Systems   Review of Systems  Constitutional:  Negative for activity change, appetite change and fever.  HENT:  Negative for congestion and rhinorrhea.   Eyes:  Negative for photophobia.  Respiratory:  Negative for cough, chest tightness and shortness of breath.   Cardiovascular:  Negative for chest pain.  Gastrointestinal:  Negative for  abdominal pain, nausea and vomiting.  Genitourinary:  Negative for dysuria and hematuria.  Musculoskeletal:  Negative for arthralgias and myalgias.  Skin:  Negative for rash.  Neurological:  Negative for dizziness, weakness and headaches.   all other systems are negative except as noted in the HPI and PMH.    Physical Exam Updated Vital Signs BP (!) 156/70 (BP Location: Left Arm)   Pulse 86   Temp 98.2 F (36.8 C) (Oral)   Resp 16   Ht 5\' 3"  (1.6 m)   SpO2 96%   BMI 17.73 kg/m  Physical Exam Vitals and nursing note reviewed.  Constitutional:      General: She is not in acute distress.    Appearance: She is well-developed. She is not ill-appearing.     Comments: No distress.  She is oriented to person place and time.  She does not know why she came to the hospital she recognizes that she is confused  HENT:     Head: Normocephalic and atraumatic.     Mouth/Throat:     Pharynx: No oropharyngeal exudate.  Eyes:     Conjunctiva/sclera: Conjunctivae normal.     Pupils: Pupils are equal, round, and reactive to light.  Neck:     Comments: No meningismus. Cardiovascular:     Rate and Rhythm: Normal rate and regular rhythm.     Heart sounds: Normal heart sounds.  No murmur heard. Pulmonary:     Effort: Pulmonary effort is normal. No respiratory distress.     Breath sounds: Normal breath sounds.  Chest:     Chest wall: No tenderness.  Abdominal:     Palpations: Abdomen is soft.     Tenderness: There is no abdominal tenderness. There is no guarding or rebound.  Musculoskeletal:        General: No tenderness. Normal range of motion.     Cervical back: Normal range of motion and neck supple.  Skin:    General: Skin is warm.  Neurological:     Mental Status: She is alert and oriented to person, place, and time.     Cranial Nerves: No cranial nerve deficit.     Motor: No abnormal muscle tone.     Coordination: Coordination normal.     Comments: CN 2-12 intact, no ataxia on  finger to nose, no nystagmus, 5/5 strength throughout, no pronator drift, Romberg negative, normal gait.   Psychiatric:        Behavior: Behavior normal.     ED Results / Procedures / Treatments   Labs (all labs ordered are listed, but only abnormal results are displayed) Labs Reviewed  CBC WITH DIFFERENTIAL/PLATELET - Abnormal; Notable for the following components:      Result Value   Lymphs Abs 0.5 (*)    All other components within normal limits  COMPREHENSIVE METABOLIC PANEL - Abnormal; Notable for the following components:   Potassium 3.4 (*)    Glucose, Bld 104 (*)    All other components within normal limits  ACETAMINOPHEN LEVEL - Abnormal; Notable for the following components:   Acetaminophen (Tylenol), Serum <10 (*)    All other components within normal limits  SALICYLATE LEVEL - Abnormal; Notable for the following components:   Salicylate Lvl <7.0 (*)    All other components within normal limits  URINALYSIS, ROUTINE W REFLEX MICROSCOPIC - Abnormal; Notable for the following components:   Hgb urine dipstick MODERATE (*)    All other components within normal limits  ETHANOL  RAPID URINE DRUG SCREEN, HOSP PERFORMED  AMMONIA  TSH  CK  CBG MONITORING, ED    EKG EKG Interpretation Date/Time:  Thursday February 06 2023 05:05:44 EDT Ventricular Rate:  84 PR Interval:  155 QRS Duration:  88 QT Interval:  380 QTC Calculation: 450 R Axis:   79  Text Interpretation: Sinus rhythm Anterior infarct, old No significant change was found Confirmed by Glynn Octave [phone]) on 02/06/2023 5:07:05 AM  Radiology DG Chest 2 View  Result Date: 02/06/2023 CLINICAL DATA:  72 year old female found down. Confusion, nausea, diarrhea. Vomiting. Remote history of right breast cancer. EXAM: CHEST - 2 VIEW COMPARISON:  Portable chest 12/28/2018 and earlier. FINDINGS: Semi upright AP and lateral views of the chest at 0549 hours. Lung volumes are stable since 2012 at the upper limits of  normal. Normal cardiac size and mediastinal contours. Visualized tracheal air column is within normal limits. Both lungs appear clear. No pneumothorax or pleural effusion. Right breast/chest wall and axillary surgical clips are new. Negative visible bowel gas and osseous structures. IMPRESSION: No acute cardiopulmonary abnormality. Electronically Signed   By: Odessa Fleming M.D.   On: 02/06/2023 06:38   CT Head Wo Contrast  Result Date: 02/06/2023 CLINICAL DATA:  72 year old female found down. Confusion, nausea, diarrhea. EXAM: CT HEAD WITHOUT CONTRAST TECHNIQUE: Contiguous axial images were obtained from the base of the skull through the vertex without intravenous contrast. RADIATION  DOSE REDUCTION: This exam was performed according to the departmental dose-optimization program which includes automated exposure control, adjustment of the mA and/or kV according to patient size and/or use of iterative reconstruction technique. COMPARISON:  None Available. FINDINGS: Brain: Cerebral volume is within normal limits for age. No midline shift, ventriculomegaly, mass effect, evidence of mass lesion, intracranial hemorrhage or evidence of cortically based acute infarction. Gray-white matter differentiation is within normal limits throughout the brain. Vascular: No suspicious intracranial vascular hyperdensity. Mild Calcified atherosclerosis at the skull base. Skull: No fracture identified. Sinuses/Orbits: Minimal mucosal thickening in the right sphenoid and maxillary sinuses. Tympanic cavities and mastoids are clear. Other: Postoperative changes to the right globe. Otherwise negative orbit and scalp soft tissues. IMPRESSION: No acute traumatic injury identified. Normal noncontrast CT appearance of the brain for age. Electronically Signed   By: Odessa Fleming M.D.   On: 02/06/2023 06:37    Procedures Procedures    Medications Ordered in ED Medications - No data to display  ED Course/ Medical Decision Making/ A&P                                  Medical Decision Making Amount and/or Complexity of Data Reviewed Independent Historian: EMS Labs: ordered. Decision-making details documented in ED Course. Radiology: ordered and independent interpretation performed. Decision-making details documented in ED Course. ECG/medicine tests: ordered and independent interpretation performed. Decision-making details documented in ED Course.  Risk Prescription drug management. Decision regarding hospitalization.   Nausea, vomiting, diarrhea, altered mental status.  Stable vital signs.  No fever.  Neurological exam is nonfocal.  Does have confusion but no other neurological deficits.  She is oriented x 3.  Will not activate code stroke at this time.  Will check IV fluids, labs, CT head and urine.  Fluids given.  Labs are reassuring and mostly unremarkable.  No significant leukocytosis.  No significant anemia.  Creatinine and electrolytes are at baseline.  CT head negative for acute injury.  No evidence of large vessel occlusion or hemorrhage.  Chest x-ray negative for pneumonia. Abdomen soft without peritoneal signs.   Unclear etiology of symptoms. Could be viral illness, medication reaction, food borne illness. Less likely CVA.  Consider transient global amnesia.  MRI brain pending.   Admission d/w Dr. Robb Matar.          Final Clinical Impression(s) / ED Diagnoses Final diagnoses:  Loss of memory  Confusion    Rx / DC Orders ED Discharge Orders     None         , Jeannett Senior, MD 02/06/23 [phone]

## 2023-02-06 NOTE — ED Notes (Signed)
ED TO INPATIENT HANDOFF REPORT  Name/Age/Gender Christina Sexton 72 y.o. female  Code Status    Code Status Orders  (From admission, onward)           Start     Ordered   02/06/23 1257  Full code  Continuous       Question:  By:  Answer:  Consent: discussion documented in EHR   02/06/23 1258           Code Status History     This patient has a current code status but no historical code status.       Home/SNF/Other Home  Chief Complaint Acute metabolic encephalopathy [G93.41]  Level of Care/Admitting Diagnosis ED Disposition     ED Disposition  Admit   Condition  --   Comment  Hospital Area: [facility] [[id number]]  Level of Care: Telemetry [5]  Admit to tele based on following criteria: Monitor for Ischemic changes  May place patient in observation at [facility] or [facility] if equivalent level of care is available:: No  Covid Evaluation: Asymptomatic - no recent exposure (last 10 days) testing not required  Diagnosis: Acute metabolic encephalopathy [1610960]  Admitting Physician: Bobette Mo [[id number]]  Attending Physician: Bobette Mo [[id number]]          Medical History Past Medical History:  Diagnosis Date   Allergy    Anemia    Asthma    as a child    Cancer (HCC)    Breast-R   Chronic kidney disease    Heart murmur    MVP   History of kidney stones     x 3 passed 1   History of radiation therapy 06/16/19- 07/15/19   Right Breast 15 fractions of 2.67 Gy to total 40.05 Gy. Right Breast boost 5 fractions of 2 Gy to total 10 Gy   Hyperlipidemia    Hypertension    Kidney stone    Mitral valve prolapse    Osteoporosis     Allergies Allergies  Allergen Reactions   Oxycodone Other (See Comments)    "kept me awake" "talked out of my head"    IV Location/Drains/Wounds Patient Lines/Drains/Airways Status     Active Line/Drains/Airways     Name Placement date Placement time Site Days   Implanted  Port 12/28/18 Left Chest 12/28/18  0822  Chest  1501   Peripheral IV 02/06/23 22 G Anterior;Distal;Left Forearm 02/06/23  --  Forearm  less than 1            Labs/Imaging Results for orders placed or performed during the hospital encounter of 02/06/23 (from the past 48 hour(s))  CBG monitoring, ED     Status: None   Collection Time: 02/06/23  4:58 AM  Result Value Ref Range   Glucose-Capillary 84 70 - 99 mg/dL    Comment: Glucose reference range applies only to samples taken after fasting for at least 8 hours.  CBC with Differential     Status: Abnormal   Collection Time: 02/06/23  5:06 AM  Result Value Ref Range   WBC 8.3 4.0 - 10.5 K/uL   RBC 4.64 3.87 - 5.11 MIL/uL   Hemoglobin 14.2 12.0 - 15.0 g/dL   HCT 95.6 21.3 - 08.6 %   MCV 94.6 80.0 - 100.0 fL   MCH 30.6 26.0 - 34.0 pg   MCHC 32.3 30.0 - 36.0 g/dL   RDW 57.8 46.9 - 62.9 %   Platelets 163 150 -  400 K/uL   nRBC 0.0 0.0 - 0.2 %   Neutrophils Relative % 83 %   Neutro Abs 6.9 1.7 - 7.7 K/uL   Lymphocytes Relative 6 %   Lymphs Abs 0.5 (L) 0.7 - 4.0 K/uL   Monocytes Relative 9 %   Monocytes Absolute 0.7 0.1 - 1.0 K/uL   Eosinophils Relative 1 %   Eosinophils Absolute 0.0 0.0 - 0.5 K/uL   Basophils Relative 1 %   Basophils Absolute 0.0 0.0 - 0.1 K/uL   Immature Granulocytes 0 %   Abs Immature Granulocytes 0.02 0.00 - 0.07 K/uL    Comment: Performed at [facility], [address], [place], [place] [address]  Comprehensive metabolic panel     Status: Abnormal   Collection Time: 02/06/23  5:06 AM  Result Value Ref Range   Sodium 142 135 - 145 mmol/L   Potassium 3.4 (L) 3.5 - 5.1 mmol/L   Chloride 106 98 - 111 mmol/L   CO2 26 22 - 32 mmol/L   Glucose, Bld 104 (H) 70 - 99 mg/dL    Comment: Glucose reference range applies only to samples taken after fasting for at least 8 hours.   BUN 17 8 - 23 mg/dL   Creatinine, Ser 7.82 0.44 - 1.00 mg/dL   Calcium 9.4 8.9 - 95.6 mg/dL   Total Protein 6.9 6.5 -  8.1 g/dL   Albumin 4.3 3.5 - 5.0 g/dL   AST 24 15 - 41 U/L   ALT 24 0 - 44 U/L   Alkaline Phosphatase 51 38 - 126 U/L   Total Bilirubin 0.9 0.3 - 1.2 mg/dL   GFR, Estimated >21 >30 mL/min    Comment: (NOTE) Calculated using the CKD-EPI Creatinine Equation (2021)    Anion gap 10 5 - 15    Comment: Performed at [facility], [address] [place], [place] [address]  Ethanol     Status: None   Collection Time: 02/06/23  5:06 AM  Result Value Ref Range   Alcohol, Ethyl (B) <10 <10 mg/dL    Comment: (NOTE) Lowest detectable limit for serum alcohol is 10 mg/dL.  For medical purposes only. Performed at [facility], [address], [place], [place] [address]   Acetaminophen level     Status: Abnormal   Collection Time: 02/06/23  5:06 AM  Result Value Ref Range   Acetaminophen (Tylenol), Serum <10 (L) 10 - 30 ug/mL    Comment: (NOTE) Therapeutic concentrations vary significantly. A range of 10-30 ug/mL  may be an effective concentration for many patients. However, some  are best treated at concentrations outside of this range. Acetaminophen concentrations >150 ug/mL at 4 hours after ingestion  and >50 ug/mL at 12 hours after ingestion are often associated with  toxic reactions.  Performed at [facility], [address], [place], [place] [address]   Salicylate level     Status: Abnormal   Collection Time: 02/06/23  5:06 AM  Result Value Ref Range   Salicylate Lvl <7.0 (L) 7.0 - 30.0 mg/dL    Comment: Performed at [facility], [address], [place], [place] [address]  Ammonia     Status: None   Collection Time: 02/06/23  5:06 AM  Result Value Ref Range   Ammonia 15 9 - 35 umol/L    Comment: Performed at [facility], [address] [place], [place] [address]  TSH     Status: None  Collection Time: 02/06/23  5:06 AM  Result Value Ref Range   TSH 2.684 0.350 - 4.500 uIU/mL     Comment: Performed by a 3rd Generation assay with a functional sensitivity of <=0.01 uIU/mL. Performed at [facility], [address] [place], [place] [address]   CK     Status: None   Collection Time: 02/06/23  5:06 AM  Result Value Ref Range   Total CK 64 38 - 234 U/L    Comment: Performed at [facility], [address], [place], [place] [address]  Rapid urine drug screen (hospital performed)     Status: None   Collection Time: 02/06/23  5:07 AM  Result Value Ref Range   Opiates NONE DETECTED NONE DETECTED   Cocaine NONE DETECTED NONE DETECTED   Benzodiazepines NONE DETECTED NONE DETECTED   Amphetamines NONE DETECTED NONE DETECTED   Tetrahydrocannabinol NONE DETECTED NONE DETECTED   Barbiturates NONE DETECTED NONE DETECTED    Comment: (NOTE) DRUG SCREEN FOR MEDICAL PURPOSES ONLY.  IF CONFIRMATION IS NEEDED FOR ANY PURPOSE, NOTIFY LAB WITHIN 5 DAYS.  LOWEST DETECTABLE LIMITS FOR URINE DRUG SCREEN Drug Class                     Cutoff (ng/mL) Amphetamine and metabolites    1000 Barbiturate and metabolites    200 Benzodiazepine                 200 Opiates and metabolites        300 Cocaine and metabolites        300 THC                            50 Performed at [facility], [address], [place], [place] [address]   Urinalysis, Routine w reflex microscopic -Urine, Clean Catch     Status: Abnormal   Collection Time: 02/06/23  5:07 AM  Result Value Ref Range   Color, Urine YELLOW YELLOW   APPearance CLEAR CLEAR   Specific Gravity, Urine 1.012 1.005 - 1.030   pH 6.0 5.0 - 8.0   Glucose, UA NEGATIVE NEGATIVE mg/dL   Hgb urine dipstick MODERATE (A) NEGATIVE   Bilirubin Urine NEGATIVE NEGATIVE   Ketones, ur NEGATIVE NEGATIVE mg/dL   Protein, ur NEGATIVE NEGATIVE mg/dL   Nitrite NEGATIVE NEGATIVE   Leukocytes,Ua NEGATIVE NEGATIVE   RBC / HPF 0-5 0 - 5 RBC/hpf   WBC, UA 0-5 0 - 5 WBC/hpf   Bacteria, UA NONE SEEN  NONE SEEN   Squamous Epithelial / HPF 0-5 0 - 5 /HPF   Mucus PRESENT    Ca Oxalate Crys, UA PRESENT     Comment: Performed at [facility], [address] [place], [place] [address]   MR BRAIN WO CONTRAST  Result Date: 02/06/2023 CLINICAL DATA:  Altered mental status, memory loss, possible transient global amnesia EXAM: MRI HEAD WITHOUT CONTRAST TECHNIQUE: Multiplanar, multiecho pulse sequences of the brain and surrounding structures were obtained without intravenous contrast. COMPARISON:  CT head obtained earlier the same day FINDINGS: Brain: There is no acute intracranial hemorrhage, extra-axial fluid collection, or acute infarct. There is no diffusion restriction in the hippocampi. Parenchymal volume is normal. The hippocampi are normal. The ventricles are normal in size. There are scattered tiny foci of FLAIR signal abnormality in the supratentorial white matter and pons likely reflecting minimal underlying chronic small-vessel ischemic change.  The pituitary and suprasellar region are normal. There is no mass lesion. There is no mass effect or midline shift. Vascular: Normal flow voids. Skull and upper cervical spine: Normal marrow signal. Sinuses/Orbits: The paranasal sinuses are clear. A right lens implant is noted. The globes and orbits are otherwise unremarkable. Other: The mastoid air cells and middle ear cavities are clear. IMPRESSION: Normal brain MRI with no evidence of transient global amnesia or other acute intracranial pathology. Electronically Signed   By: Lesia Hausen M.D.   On: 02/06/2023 09:11   DG Chest 2 View  Result Date: 02/06/2023 CLINICAL DATA:  72 year old female found down. Confusion, nausea, diarrhea. Vomiting. Remote history of right breast cancer. EXAM: CHEST - 2 VIEW COMPARISON:  Portable chest 12/28/2018 and earlier. FINDINGS: Semi upright AP and lateral views of the chest at 0549 hours. Lung volumes are stable since 2012 at the upper limits of normal.  Normal cardiac size and mediastinal contours. Visualized tracheal air column is within normal limits. Both lungs appear clear. No pneumothorax or pleural effusion. Right breast/chest wall and axillary surgical clips are new. Negative visible bowel gas and osseous structures. IMPRESSION: No acute cardiopulmonary abnormality. Electronically Signed   By: Odessa Fleming M.D.   On: 02/06/2023 06:38   CT Head Wo Contrast  Result Date: 02/06/2023 CLINICAL DATA:  72 year old female found down. Confusion, nausea, diarrhea. EXAM: CT HEAD WITHOUT CONTRAST TECHNIQUE: Contiguous axial images were obtained from the base of the skull through the vertex without intravenous contrast. RADIATION DOSE REDUCTION: This exam was performed according to the departmental dose-optimization program which includes automated exposure control, adjustment of the mA and/or kV according to patient size and/or use of iterative reconstruction technique. COMPARISON:  None Available. FINDINGS: Brain: Cerebral volume is within normal limits for age. No midline shift, ventriculomegaly, mass effect, evidence of mass lesion, intracranial hemorrhage or evidence of cortically based acute infarction. Gray-white matter differentiation is within normal limits throughout the brain. Vascular: No suspicious intracranial vascular hyperdensity. Mild Calcified atherosclerosis at the skull base. Skull: No fracture identified. Sinuses/Orbits: Minimal mucosal thickening in the right sphenoid and maxillary sinuses. Tympanic cavities and mastoids are clear. Other: Postoperative changes to the right globe. Otherwise negative orbit and scalp soft tissues. IMPRESSION: No acute traumatic injury identified. Normal noncontrast CT appearance of the brain for age. Electronically Signed   By: Odessa Fleming M.D.   On: 02/06/2023 06:37    Pending Labs Unresulted Labs (From admission, onward)     Start     Ordered   02/07/23 0500  HIV Antibody (routine testing w rflx)  (HIV Antibody  (Routine testing w reflex) panel)  Tomorrow morning,   R        02/06/23 1258   02/07/23 0500  Comprehensive metabolic panel  Tomorrow morning,   R        02/06/23 1258   02/07/23 0500  CBC  Tomorrow morning,   R        02/06/23 1258            Vitals/Pain Today's Vitals   02/06/23 1428 02/06/23 1437 02/06/23 1600 02/06/23 1800  BP:  118/60 (!) 142/76 (!) 128/59  Pulse:  80 81 77  Resp:  15  15  Temp:  98.1 F (36.7 C)    TempSrc:      SpO2:  97% 98% 96%  Weight: 45.4 kg     Height: 5\' 2"  (1.575 m)     PainSc:  Isolation Precautions No active isolations  Medications Medications  0.9 % NaCl with KCl 40 mEq / L  infusion (0 mL/hr Intravenous Stopped 02/06/23 1553)  enoxaparin (LOVENOX) injection 30 mg (has no administration in time range)  0.9 % NaCl with KCl 20 mEq/ L  infusion ( Intravenous New Bag/Given 02/06/23 1601)  atorvastatin (LIPITOR) tablet 20 mg (20 mg Oral Given 02/06/23 1430)  ketorolac (ACULAR) 0.5 % ophthalmic solution 1 drop (has no administration in time range)  ofloxacin (OCUFLOX) 0.3 % ophthalmic solution 1 drop (1 drop Right Eye Given 02/06/23 1426)  prednisoLONE acetate (PRED FORTE) 1 % ophthalmic suspension 1 drop (has no administration in time range)  pantoprazole (PROTONIX) EC tablet 40 mg (40 mg Oral Given 02/06/23 1422)  LORazepam (ATIVAN) tablet 1 mg (1 mg Oral Given 02/06/23 0728)  0.9 %  sodium chloride infusion (500 mLs Intravenous New Bag/Given 02/06/23 1425)  potassium chloride SA (KLOR-CON M) CR tablet 20 mEq (20 mEq Oral Given 02/06/23 1422)    Mobility walks

## 2023-02-06 NOTE — ED Notes (Signed)
Pt ambulated to the bathroom. Tolerated well.

## 2023-02-06 NOTE — ED Notes (Signed)
Pt given sandwich, apple sauce, cheese and water.

## 2023-02-07 DIAGNOSIS — R112 Nausea with vomiting, unspecified: Secondary | ICD-10-CM

## 2023-02-07 DIAGNOSIS — K219 Gastro-esophageal reflux disease without esophagitis: Secondary | ICD-10-CM | POA: Diagnosis not present

## 2023-02-07 DIAGNOSIS — E7800 Pure hypercholesterolemia, unspecified: Secondary | ICD-10-CM

## 2023-02-07 DIAGNOSIS — I5189 Other ill-defined heart diseases: Secondary | ICD-10-CM

## 2023-02-07 DIAGNOSIS — I10 Essential (primary) hypertension: Secondary | ICD-10-CM | POA: Diagnosis not present

## 2023-02-07 DIAGNOSIS — R197 Diarrhea, unspecified: Secondary | ICD-10-CM

## 2023-02-07 DIAGNOSIS — G9341 Metabolic encephalopathy: Secondary | ICD-10-CM | POA: Diagnosis not present

## 2023-02-07 DIAGNOSIS — E876 Hypokalemia: Secondary | ICD-10-CM

## 2023-02-07 NOTE — Progress Notes (Signed)
OT Cancellation Note  Patient Details Name: KIRYN KOKOSZKA MRN: [id number] DOB: 11-11-1950   Cancelled Treatment:    Reason Eval/Treat Not Completed: OT screened, no needs identified, will sign off. Evaluating PT informed OT that pt is doing well and does not require OT services. OT will sign off.     Reuben Likes, OTR/L 02/07/2023, 5:00 PM

## 2023-02-07 NOTE — Discharge Summary (Signed)
Physician Discharge Summary   Patient: Christina Sexton MRN: [id number] DOB: 06-15-1951  Admit date:     02/06/2023  Discharge date: 02/07/23  Discharge Physician: Marguerita Merles, DO   PCP: Merri Brunette, MD   Recommendations at discharge:   Follow-up with PCP within 1 to 2 weeks and repeat CBC, CMP, mag, Phos within 1 week Follow-up with neurology in outpatient setting if necessary Follow-up with medical oncology in 1 to 2 weeks and appointment is being scheduled  Discharge Diagnoses: Principal Problem:   Acute metabolic encephalopathy Active Problems:   GERD   Benign hypertension   Hypercholesterolemia   Grade I diastolic dysfunction   Hypokalemia   Nausea vomiting and diarrhea  Resolved Problems:   * No resolved hospital problems. [facility] Course: HPI per Dr. Sanda Klein on 02/06/23 Christina Sexton is a 72 y.o. female with medical history significant of seasonal allergies, anemia, childhood asthma, right breast cancer, history of radiation therapy, skin cancer, chronic kidney disease, grade 1 diastolic dysfunction, mitral valve prolapse, nephrolithiasis, osteoporosis, status post cataract surgery on Tuesday who was brought via EMS to the emergency department after her neighbor found her on the floor very confused after the patient have several episodes of nausea, vomiting and diarrhea.  Overnight, her mental status is back to baseline.  She does not have much recollection of what happened.  She ate some salmon and green beans.  No sick contacts or travel history.  No abdominal pain constipation, melena or hematochezia.  No flank pain, dysuria, frequency or hematuria. She denied fever, chills, rhinorrhea, sore throat, wheezing or hemoptysis.  No chest pain, palpitations, diaphoresis, PND, orthopnea or pitting edema of the lower extremities.  No polyuria, polydipsia, polyphagia or blurred vision.   Lab work:  Urine analysis showed moderate hemoglobin, but otherwise unremarkable.  UDS  was negative.  CBC showed a white count of 8.30, hemoglobin 14.2 g/dL and platelets 413.  Unremarkable TSH, alcohol, acetaminophen, salicylate, total CK and ammonia level.  CMP showed a potassium of 3.4 mmol/L and glucose of 104 mg deciliter.  The rest of the CMP measurements were normal.   Imaging: Two-view chest radiograph with no acute cardiopulmonary abnormality.  CT head without contrast with no acute traumatic injury.  Normal noncontrast CT appearance of the brain for age.  MRI of the brain without contrast showed normal brain MRI with no evidence of transient global amnesia or other acute intracranial pathology.   ED course: Initial vital signs were temperature 98.2 F, pulse 86, respirations 16, BP 156/70 mmHg O2 sat 96% on room air.  The patient received lorazepam 1 mg p.o. x 1 for MRI imaging.  **Interim History Further workup was done and patient became alert and oriented and was back to her baseline and symptoms were likely related to her nausea vomiting.  MRI was done and showed "Normal brain MRI with no evidence of transient global amnesia or other acute intracranial pathology." PT OT recommended home health.  Case was discussed with her oncologist will follow-up with her in about 2 weeks for the pancytopenia.  She is medically stable for discharge and follow-up with PCP and medical oncologist within 1 to 2 weeks  Assessment and Plan:  Acute metabolic encephalopathy in the setting of:   Nausea vomiting and diarrhea -Observation/MedSurg. -Continue IV fluids hospitalized -MRI was done and was normal and showed no evidence of TGA or any acute intracranial pathology -Neurochecks every shift. -Antiemetics as needed. -Pantoprazole 40 mg p.o. daily. -Follow CBC,  CMP and repeat within 1 week  Hypokalemia -Patient's K+ Level Trend: Recent Labs  Lab 02/06/23 0506 02/07/23 0530  K 3.4* 3.7  -Replete prior to D/C -Continue to Monitor and Replete as Necessary -Repeat CMP in the AM    GERD -Pantoprazole 40 mg p.o. daily while in the hospital.   Benign Hypertension -Normal weight. -Currently not on medications.   Hypercholesterolemia -Continue Atorvastatin 20 mg p.o. daily.   Grade I Diastolic Dysfunction -Seems to be mildly volume depleted and improved with IVF -Strict I's and O's and Daily Weights. No intake or output data in the 24 hours ending 02/09/23 2124  -Follow up with PCP within 1-2 weeks   Hyperbilirubinemia -Likely Reactive; Bilirubin Trend: Recent Labs  Lab 02/06/23 0506 02/07/23 0530  BILITOT 0.9 1.3*  -Continue to Monitor and Trend and repeat CMP within 1 week  Pancytopenia -WBC Trend: Recent Labs  Lab 02/06/23 0506 02/07/23 0530  WBC 8.3 2.8*  HGB 14.2 11.6*  HCT 43.9 37.0  MCV 94.6 96.1  PLT 163 137*  -Repeat CBC within 1 week and follow up with Medical Oncology within 1-2 weeks   Right Breast Cancer -Has an appointment with her oncologist in the coming weeks  Consultants: None Procedures performed: As delineated above Disposition: Home health Diet recommendation:  Discharge Diet Orders (From admission, onward)     Start     Ordered   02/07/23 0000  Diet - low sodium heart healthy        02/07/23 1308           Cardiac diet DISCHARGE MEDICATION: Allergies as of 02/07/2023       Reactions   Oxycodone Other (See Comments)   "kept me awake" "talked out of my head"        Medication List     TAKE these medications    atorvastatin 20 MG tablet Commonly known as: LIPITOR Take 20 mg by mouth daily.   ketorolac 0.5 % ophthalmic solution Commonly known as: ACULAR Place 1 drop into the right eye 4 (four) times daily.   ofloxacin 0.3 % ophthalmic solution Commonly known as: OCUFLOX Place 1 drop into the right eye 4 (four) times daily.   prednisoLONE acetate 1 % ophthalmic suspension Commonly known as: PRED FORTE Place 1 drop into the right eye 4 (four) times daily.   VITAMIN D PO Take 1 capsule by mouth  daily.        Discharge Exam: Filed Weights   02/06/23 1428  Weight: 45.4 kg   Vitals:   02/07/23 0932 02/07/23 1331  BP: 114/72 127/65  Pulse: 92 65  Resp:  17  Temp:  97.8 F (36.6 C)  SpO2: 98% 98%   Examination: Physical Exam:  Constitutional: Thin Caucasian female in NAD Respiratory: Diminished to auscultation bilaterally, no wheezing, rales, rhonchi or crackles. Normal respiratory effort and patient is not tachypenic. No accessory muscle use.  Cardiovascular: RRR, no murmurs / rubs / gallops. S1 and S2 auscultated. No extremity edema.  Abdomen: Soft, non-tender, non-distended. Bowel sounds positive.  GU: Deferred. Musculoskeletal: No clubbing / cyanosis of digits/nails. No joint deformity upper and lower extremities.  Skin: No rashes, lesions, ulcers. No induration; Warm and dry.  Neurologic: CN 2-12 grossly intact with no focal deficits. Romberg sign and cerebellar reflexes not assessed.  Psychiatric: Normal judgment and insight. Alert and oriented x 3. Normal mood and appropriate affect.   Condition at discharge: stable  The results of significant diagnostics from this hospitalization (including  imaging, microbiology, ancillary and laboratory) are listed below for reference.   Imaging Studies: MR BRAIN WO CONTRAST  Result Date: 02/06/2023 CLINICAL DATA:  Altered mental status, memory loss, possible transient global amnesia EXAM: MRI HEAD WITHOUT CONTRAST TECHNIQUE: Multiplanar, multiecho pulse sequences of the brain and surrounding structures were obtained without intravenous contrast. COMPARISON:  CT head obtained earlier the same day FINDINGS: Brain: There is no acute intracranial hemorrhage, extra-axial fluid collection, or acute infarct. There is no diffusion restriction in the hippocampi. Parenchymal volume is normal. The hippocampi are normal. The ventricles are normal in size. There are scattered tiny foci of FLAIR signal abnormality in the supratentorial white  matter and pons likely reflecting minimal underlying chronic small-vessel ischemic change. The pituitary and suprasellar region are normal. There is no mass lesion. There is no mass effect or midline shift. Vascular: Normal flow voids. Skull and upper cervical spine: Normal marrow signal. Sinuses/Orbits: The paranasal sinuses are clear. A right lens implant is noted. The globes and orbits are otherwise unremarkable. Other: The mastoid air cells and middle ear cavities are clear. IMPRESSION: Normal brain MRI with no evidence of transient global amnesia or other acute intracranial pathology. Electronically Signed   By: Lesia Hausen M.D.   On: 02/06/2023 09:11   DG Chest 2 View  Result Date: 02/06/2023 CLINICAL DATA:  72 year old female found down. Confusion, nausea, diarrhea. Vomiting. Remote history of right breast cancer. EXAM: CHEST - 2 VIEW COMPARISON:  Portable chest 12/28/2018 and earlier. FINDINGS: Semi upright AP and lateral views of the chest at 0549 hours. Lung volumes are stable since 2012 at the upper limits of normal. Normal cardiac size and mediastinal contours. Visualized tracheal air column is within normal limits. Both lungs appear clear. No pneumothorax or pleural effusion. Right breast/chest wall and axillary surgical clips are new. Negative visible bowel gas and osseous structures. IMPRESSION: No acute cardiopulmonary abnormality. Electronically Signed   By: Odessa Fleming M.D.   On: 02/06/2023 06:38   CT Head Wo Contrast  Result Date: 02/06/2023 CLINICAL DATA:  72 year old female found down. Confusion, nausea, diarrhea. EXAM: CT HEAD WITHOUT CONTRAST TECHNIQUE: Contiguous axial images were obtained from the base of the skull through the vertex without intravenous contrast. RADIATION DOSE REDUCTION: This exam was performed according to the departmental dose-optimization program which includes automated exposure control, adjustment of the mA and/or kV according to patient size and/or use of iterative  reconstruction technique. COMPARISON:  None Available. FINDINGS: Brain: Cerebral volume is within normal limits for age. No midline shift, ventriculomegaly, mass effect, evidence of mass lesion, intracranial hemorrhage or evidence of cortically based acute infarction. Gray-white matter differentiation is within normal limits throughout the brain. Vascular: No suspicious intracranial vascular hyperdensity. Mild Calcified atherosclerosis at the skull base. Skull: No fracture identified. Sinuses/Orbits: Minimal mucosal thickening in the right sphenoid and maxillary sinuses. Tympanic cavities and mastoids are clear. Other: Postoperative changes to the right globe. Otherwise negative orbit and scalp soft tissues. IMPRESSION: No acute traumatic injury identified. Normal noncontrast CT appearance of the brain for age. Electronically Signed   By: Odessa Fleming M.D.   On: 02/06/2023 06:37    Microbiology: Results for orders placed or performed during the hospital encounter of 12/03/19  SARS CORONAVIRUS 2 (TAT 6-24 HRS) Nasopharyngeal Nasopharyngeal Swab     Status: None   Collection Time: 12/03/19 10:20 AM   Specimen: Nasopharyngeal Swab  Result Value Ref Range Status   SARS Coronavirus 2 NEGATIVE NEGATIVE Final    Comment: (NOTE)  SARS-CoV-2 target nucleic acids are NOT DETECTED. The SARS-CoV-2 RNA is generally detectable in upper and lower respiratory specimens during the acute phase of infection. Negative results do not preclude SARS-CoV-2 infection, do not rule out co-infections with other pathogens, and should not be used as the sole basis for treatment or other patient management decisions. Negative results must be combined with clinical observations, patient history, and epidemiological information. The expected result is Negative. Fact Sheet for Patients: HairSlick.no Fact Sheet for Healthcare Providers: quierodirigir.com This test is not yet  approved or cleared by the [place] FDA and  has been authorized for detection and/or diagnosis of SARS-CoV-2 by FDA under an Emergency Use Authorization (EUA). This EUA will remain  in effect (meaning this test can be used) for the duration of the COVID-19 declaration under Section 56 4(b)(1) of the Act, 21 U.S.C. section 360bbb-3(b)(1), unless the authorization is terminated or revoked sooner. Performed at [facility], [address], [place], [place] [address]    Labs: CBC: Recent Labs  Lab 02/06/23 0506 02/07/23 0530  WBC 8.3 2.8*  NEUTROABS 6.9  --   HGB 14.2 11.6*  HCT 43.9 37.0  MCV 94.6 96.1  PLT 163 137*   Basic Metabolic Panel: Recent Labs  Lab 02/06/23 0506 02/07/23 0530  NA 142 139  K 3.4* 3.7  CL 106 111  CO2 26 22  GLUCOSE 104* 121*  BUN 17 12  CREATININE 0.73 0.74  CALCIUM 9.4 8.3*   Liver Function Tests: Recent Labs  Lab 02/06/23 0506 02/07/23 0530  AST 24 18  ALT 24 17  ALKPHOS 51 36*  BILITOT 0.9 1.3*  PROT 6.9 5.5*  ALBUMIN 4.3 3.5   CBG: Recent Labs  Lab 02/06/23 0458  GLUCAP 84   Discharge time spent: greater than 30 minutes.  Signed: Marguerita Merles, DO [facility] 02/09/2023

## 2023-02-07 NOTE — Progress Notes (Signed)
   02/07/23 1334  TOC Brief Assessment  Insurance and Status Reviewed  Patient has primary care physician Yes  Home environment has been reviewed Home w/ friend  Prior level of function: Independent  Prior/Current Home Services No current home services  Social Determinants of Health Reivew SDOH reviewed no interventions necessary  Readmission risk has been reviewed Yes  Transition of care needs no transition of care needs at this time

## 2023-02-07 NOTE — Evaluation (Signed)
Physical Therapy Evaluation Only Patient Details Name: Christina Sexton MRN: [id number] DOB: 1951-03-13 Today's Date: 02/07/2023  History of Present Illness  Christina Sexton is a 72 y.o. female admitted with acute metabolic encephalopathy. PMH: seasonal allergies, anemia, childhood asthma, right breast cancer, history of radiation therapy, skin cancer, chronic kidney disease, grade 1 diastolic dysfunction, mitral valve prolapse, nephrolithiasis, osteoporosis, status post cataract surgery on Tuesday  Clinical Impression  Pt ind with mobility, no overt LOB, no AD needed. Pt and friend deny pt falling at home, report pt with vomiting and diarrhea in restroom then pt was laying on bed when EMS arrived; pt denies falls. Pt reports ind at baseline, works out multiple times weeks, drives, has friend support as needed. No acute PT needs identified, no f/u recommendation. Will sign off at this time.      If plan is discharge home, recommend the following:     Can travel by private vehicle        Equipment Recommendations None recommended by PT  Recommendations for Other Services       Functional Status Assessment Patient has not had a recent decline in their functional status     Precautions / Restrictions Restrictions Weight Bearing Restrictions: No      Mobility  Bed Mobility Overal bed mobility: Independent                  Transfers Overall transfer level: Independent Equipment used: None                    Ambulation/Gait Ambulation/Gait assistance: Independent Gait Distance (Feet): 500 Feet Assistive device: None Gait Pattern/deviations: WFL(Within Functional Limits) Gait velocity: WFL     General Gait Details: step through gait pattern, able to navigate around obstacles in hallway and room environments, completes head turns and direction changes without LOB, therapist steering IV pole  Stairs            Wheelchair Mobility     Tilt Bed     Modified Rankin (Stroke Patients Only)       Balance Overall balance assessment: No apparent balance deficits (not formally assessed)                                           Pertinent Vitals/Pain Pain Assessment Pain Assessment: No/denies pain    Home Living Family/patient expects to be discharged to:: Private residence Living Arrangements: Alone Available Help at Discharge: Friend(s);Available PRN/intermittently Type of Home: House Home Access: Stairs to enter Entrance Stairs-Rails: Left Entrance Stairs-Number of Steps: 1 Alternate Level Stairs-Number of Steps: flight Home Layout: Two level;Able to live on main level with bedroom/bathroom Home Equipment: None      Prior Function Prior Level of Function : Independent/Modified Independent;Working/employed             Mobility Comments: pt reports ind with community ambulation, no AD, denies falls, exercises at the gym multiple times a week ADLs Comments: pt reports ind with ADLs/IADLs     Extremity/Trunk Assessment   Upper Extremity Assessment Upper Extremity Assessment: Overall WFL for tasks assessed    Lower Extremity Assessment Lower Extremity Assessment: Overall WFL for tasks assessed    Cervical / Trunk Assessment Cervical / Trunk Assessment: Normal  Communication   Communication Communication: No apparent difficulties  Cognition Arousal: Alert Behavior During Therapy: WFL for tasks assessed/performed Overall Cognitive Status:  Within Functional Limits for tasks assessed                                 General Comments: Pt a&o x4        General Comments      Exercises     Assessment/Plan    PT Assessment Patient does not need any further PT services  PT Problem List         PT Treatment Interventions      PT Goals (Current goals can be found in the Care Plan section)  Acute Rehab PT Goals Patient Stated Goal: "I'm ready to go home" PT Goal  Formulation: All assessment and education complete, DC therapy    Frequency       Co-evaluation               AM-PAC PT "6 Clicks" Mobility  Outcome Measure Help needed turning from your back to your side while in a flat bed without using bedrails?: None Help needed moving from lying on your back to sitting on the side of a flat bed without using bedrails?: None Help needed moving to and from a bed to a chair (including a wheelchair)?: None Help needed standing up from a chair using your arms (e.g., wheelchair or bedside chair)?: None Help needed to walk in hospital room?: None Help needed climbing 3-5 steps with a railing? : None 6 Click Score: 24    End of Session   Activity Tolerance: Patient tolerated treatment well Patient left: in bed;with call bell/phone within reach;with family/visitor present Nurse Communication: Mobility status PT Visit Diagnosis: Other abnormalities of gait and mobility (R26.89)    Time: 4098-1191 PT Time Calculation (min) (ACUTE ONLY): 16 min   Charges:   PT Evaluation $PT Eval Low Complexity: 1 Low   PT General Charges $$ ACUTE PT VISIT: 1 Visit         Christina Sexton  PT, DPT 02/07/23, 1:11 PM

## 2023-02-09 NOTE — Hospital Course (Signed)
HPI per Dr. Sanda Klein on 02/06/23 Christina Sexton is a 72 y.o. female with medical history significant of seasonal allergies, anemia, childhood asthma, right breast cancer, history of radiation therapy, skin cancer, chronic kidney disease, grade 1 diastolic dysfunction, mitral valve prolapse, nephrolithiasis, osteoporosis, status post cataract surgery on Tuesday who was brought via EMS to the emergency department after her neighbor found her on the floor very confused after the patient have several episodes of nausea, vomiting and diarrhea.  Overnight, her mental status is back to baseline.  She does not have much recollection of what happened.  She ate some salmon and green beans.  No sick contacts or travel history.  No abdominal pain constipation, melena or hematochezia.  No flank pain, dysuria, frequency or hematuria. She denied fever, chills, rhinorrhea, sore throat, wheezing or hemoptysis.  No chest pain, palpitations, diaphoresis, PND, orthopnea or pitting edema of the lower extremities.  No polyuria, polydipsia, polyphagia or blurred vision.   Lab work:  Urine analysis showed moderate hemoglobin, but otherwise unremarkable.  UDS was negative.  CBC showed a white count of 8.30, hemoglobin 14.2 g/dL and platelets 010.  Unremarkable TSH, alcohol, acetaminophen, salicylate, total CK and ammonia level.  CMP showed a potassium of 3.4 mmol/L and glucose of 104 mg deciliter.  The rest of the CMP measurements were normal.   Imaging: Two-view chest radiograph with no acute cardiopulmonary abnormality.  CT head without contrast with no acute traumatic injury.  Normal noncontrast CT appearance of the brain for age.  MRI of the brain without contrast showed normal brain MRI with no evidence of transient global amnesia or other acute intracranial pathology.   ED course: Initial vital signs were temperature 98.2 F, pulse 86, respirations 16, BP 156/70 mmHg O2 sat 96% on room air.  The patient received lorazepam 1  mg p.o. x 1 for MRI imaging.  **Interim History Further workup was done and patient became alert and oriented and was back to her baseline and symptoms were likely related to her nausea vomiting.  MRI was done and showed "Normal brain MRI with no evidence of transient global amnesia or other acute intracranial pathology." PT OT recommended home health.  Case was discussed with her oncologist will follow-up with her in about 2 weeks for the pancytopenia.  She is medically stable for discharge and follow-up with PCP and medical oncologist within 1 to 2 weeks  Assessment and Plan:  Acute metabolic encephalopathy in the setting of:   Nausea vomiting and diarrhea -Observation/MedSurg. -Continue IV fluids hospitalized -MRI was done and was normal and showed no evidence of TGA or any acute intracranial pathology -Neurochecks every shift. -Antiemetics as needed. -Pantoprazole 40 mg p.o. daily. -Follow CBC, CMP and repeat within 1 week  Hypokalemia -Patient's K+ Level Trend: Recent Labs  Lab 02/06/23 0506 02/07/23 0530  K 3.4* 3.7  -Replete prior to D/C -Continue to Monitor and Replete as Necessary -Repeat CMP in the AM   GERD -Pantoprazole 40 mg p.o. daily while in the hospital.   Benign Hypertension -Normal weight. -Currently not on medications.   Hypercholesterolemia -Continue Atorvastatin 20 mg p.o. daily.   Grade I Diastolic Dysfunction -Seems to be mildly volume depleted and improved with IVF -Strict I's and O's and Daily Weights. No intake or output data in the 24 hours ending 02/09/23 2124  -Follow up with PCP within 1-2 weeks   Hyperbilirubinemia -Likely Reactive; Bilirubin Trend: Recent Labs  Lab 02/06/23 0506 02/07/23 0530  BILITOT 0.9 1.3*  -  Continue to Monitor and Trend and repeat CMP within 1 week  Pancytopenia -WBC Trend: Recent Labs  Lab 02/06/23 0506 02/07/23 0530  WBC 8.3 2.8*  HGB 14.2 11.6*  HCT 43.9 37.0  MCV 94.6 96.1  PLT 163 137*  -Repeat  CBC within 1 week and follow up with Medical Oncology within 1-2 weeks   Right Breast Cancer -Has an appointment with her oncologist in the coming weeks

## 2023-02-10 ENCOUNTER — Telehealth: Payer: Self-pay | Admitting: Hematology

## 2023-02-16 NOTE — Progress Notes (Unsigned)
Patient Care Team: Merri Brunette, MD as PCP - General (Internal Medicine) Pershing Proud, RN as Oncology Nurse Navigator Donnelly Angelica, RN as Oncology Nurse Navigator Almond Lint, MD as Consulting Physician (General Surgery) Malachy Mood, MD as Consulting Physician (Hematology) Lonie Peak, MD as Attending Physician (Radiation Oncology)   CHIEF COMPLAINT: Follow-up recent hospitalization for altered mental status and abnormal blood work, history of right breast cancer  Oncology History Overview Note  Cancer Staging Malignant neoplasm of upper-outer quadrant of right female breast [facility]) Staging form: Breast, AJCC 8th Edition - Clinical stage from 12/09/2018: Stage IIA (cT2, cN0, cM0, G3, ER-, PR-, HER2+) - Signed by Malachy Mood, MD on 12/15/2018 - Pathologic stage from 05/19/2019: No Stage Recommended (ypT0, pN0, cM0, GX, ER: Not Assessed, PR: Not Assessed, HER2: Not Assessed) - Signed by Malachy Mood, MD on 06/01/2019    Malignant neoplasm of upper-outer quadrant of right female breast (HCC)  12/07/2018 Mammogram   Mammogram 12/07/18  IMPRESSION: 1. 3 cm mass in the 11 o'clock position of the right breast, 4 cm the nipple, measuring 3 x 2 x 2 cm, highly suspicious for breast carcinoma. No enlarged or abnormal right axillary lymph nodes.   12/09/2018 Cancer Staging   Staging form: Breast, AJCC 8th Edition - Clinical stage from 12/09/2018: Stage IIA (cT2, cN0, cM0, G3, ER-, PR-, HER2+) - Signed by Malachy Mood, MD on 12/15/2018   12/09/2018 Initial Biopsy   Diagnosis 12/09/18 Breast, right, needle core biopsy, 11 o'clock - INVASIVE DUCTAL CARCINOMA. - DUCTAL CARCINOMA IN SITU. - SEE COMMENT.   12/09/2018 Receptors her2   The tumor cells are POSITIVE for Her2 (3+). Estrogen Receptor: 0%, NEGATIVE Progesterone Receptor: 0%, NEGATIVE Proliferation Marker Ki67: 30%   12/15/2018 Initial Diagnosis   Malignant neoplasm of upper-outer quadrant of right female breast (HCC)   12/21/2018 Breast  MRI   IMPRESSION: 1. Approximate 4 cm mass involving the UPPER RIGHT breast with extension into the UPPER INNER QUADRANT and UPPER OUTER QUADRANT, predominantly located in the UPPER INNER QUADRANT, biopsy-proven invasive ductal carcinoma and DCIS. The mass abuts the chest wall though I cannot confirm pectoralis muscle invasion. 2. Superficial satellite mass versus pathologic intramammary lymph node in the UPPER OUTER QUADRANT of the RIGHT breast measuring approximately 1 cm, located LATERAL to the dominant mass. 3. No MRI evidence of malignancy involving the LEFT breast. 4. No pathologic lymphadenopathy otherwise.   12/24/2018 Pathology Results   Diagnosis Breast, right, needle core biopsy, upper outer quadrant, peripheral to the recently biopsy proven breast carcinoma - INVASIVE DUCTAL CARCINOMA. SEE COMMENT. Results: IMMUNOHISTOCHEMICAL AND MORPHOMETRIC ANALYSIS PERFORMED MANUALLY The tumor cells are POSITIVE for Her2 (3+). Estrogen Receptor: 0%, NEGATIVE Progesterone Receptor: 0%, NEGATIVE Proliferation Marker Ki67: 40%   01/06/2019 - 10/25/2019 Chemotherapy   Neoadjuvant chemotherapy TCHP beginning 01/06/19 completed 5 cycle of TCHP on 03/30/19.  She started maintenance Herceptin on 05/04/19. D/c Perjeta after 05/04/19 due to low benefit. Stopped Herceptin after 10/25/19 due to patient request.    04/15/2019 Breast MRI   IMPRESSION: 1. Interval resolution of the 2 enhancing masses corresponding with biopsy proven malignancy in the upper right breast. There is no residual or new, suspicious enhancement. 2. No MRI evidence of malignancy in the left breast.   04/15/2019 Echocardiogram   FINDINGS  Left Ventricle: Left ventricular ejection fraction, by visual estimation, is 55 to 60%. The left ventricle has normal function. There is no left ventricular hypertrophy. Normal left ventricular size. Spectral Doppler shows Left ventricular  diastolic  parameters were normal pattern of LV diastolic  filling. Compared with the echo 12/22/18, systolic function is slightly worse, especially in the anteroseptum. However overall strain is slightly better.     05/19/2019 Surgery   BREAST LUMPECTOMY WITH RADIOACTIVE SEED X2  AND SENTINEL LYMPH NODE BIOPSY by Dr. Donell Beers  05/19/19    05/19/2019 Pathology Results   FINAL MICROSCOPIC DIAGNOSIS:   A. BREAST, RIGHT LATERAL SATELLITE LESION, LUMPECTOMY:  - No residual invasive carcinoma status post neoadjuvant treatment.  - One intraparenchymal lymph node, negative for carcinoma (0/1).  - See oncology table.   B. BREAST, RIGHT DOMINANT MASS, LUMPECTOMY:  - No residual invasive carcinoma status post neoadjuvant treatment.  - Biopsy site changes.  - See oncology table.   C. SENTINEL LYMPH NODE, RIGHT AXILLARY, EXCISION:  - One lymph node, negative for carcinoma (0/1).   D. SENTINEL LYMPH NODE, RIGHT AXILLARY, EXCISION:  - One lymph node, negative for carcinoma (0/1).   E. SENTINEL LYMPH NODE, RIGHT AXILLARY, EXCISION:  - One lymph node, negative for carcinoma (0/1).   F. SENTINEL LYMPH NODE, RIGHT AXILLARY, EXCISION:  - One lymph node, negative for carcinoma (0/1).    05/19/2019 Cancer Staging   Staging form: Breast, AJCC 8th Edition - Pathologic stage from 05/19/2019: No Stage Recommended (ypT0, pN0, cM0, GX, ER: Not Assessed, PR: Not Assessed, HER2: Not Assessed) - Signed by Malachy Mood, MD on 06/01/2019   06/16/2019 - 07/15/2019 Radiation Therapy   Adjuvant Radiation with Dr. Basilio Cairo  06/16/2019 through 07/15/2019      CURRENT THERAPY: Breast cancer surveillance  INTERVAL HISTORY Mr. Burnes presents for hospital follow-up, last seen by me for routine breast cancer surveillance 09/2022.  She had been doing well in her normal state of health until she had cataract surgery 8/6.  2 days later she developed GI upset with N/V/D.  She says she has these episodes 1-2 times a year.  She was staying with a friend and asked for Gatorade to the  bathroom, the next thing she remembers she is walking out to the ambulance and was admitted.  Workup was essentially unremarkable.  Labs 8/9 showed pancytopenia and slightly elevated bilirubin.  Since discharge she has not had any recurrent episodes. She had sinus infection last week. Feeling well, eating and drinking to try to gain the weight she lost in the hospital.  Denies any breast concerns.  ROS  All other systems reviewed and negative  Past Medical History:  Diagnosis Date   Allergy    Anemia    Asthma    as a child    Cancer (HCC)    Breast-R   Chronic kidney disease    Heart murmur    MVP   History of kidney stones     x 3 passed 1   History of radiation therapy 06/16/19- 07/15/19   Right Breast 15 fractions of 2.67 Gy to total 40.05 Gy. Right Breast boost 5 fractions of 2 Gy to total 10 Gy   Hyperlipidemia    Hypertension    Kidney stone    Mitral valve prolapse    Osteoporosis      Past Surgical History:  Procedure Laterality Date   BREAST BIOPSY Left 12/27/2015   benign   BREAST BIOPSY Right 07/26/2013   benign   BREAST EXCISIONAL BIOPSY Right 09/08/2013   high risk    BREAST LUMPECTOMY WITH RADIOACTIVE SEED AND SENTINEL LYMPH NODE BIOPSY Right 05/19/2019   Procedure: BREAST LUMPECTOMY WITH RADIOACTIVE SEED  X2  AND SENTINEL LYMPH NODE BIOPSY;  Surgeon: Almond Lint, MD;  Location: [facility] OR;  Service: General;  Laterality: Right;   COLONOSCOPY W/ POLYPECTOMY     CYSTOSCOPY     PORT-A-CATH REMOVAL Left 12/07/2019   Procedure: REMOVAL PORT-A-CATH;  Surgeon: Almond Lint, MD;  Location: [facility] OR;  Service: General;  Laterality: Left;   PORTACATH PLACEMENT N/A 12/28/2018   Procedure: INSERTION PORT-A-CATH;  Surgeon: Almond Lint, MD;  Location: [facility]S;  Service: General;  Laterality: N/A;   SHOULDER SURGERY Right    frozen shoulder and bone spur     Outpatient Encounter Medications as of 02/17/2023  Medication Sig   atorvastatin (LIPITOR) 20 MG tablet Take 20 mg by  mouth daily.   prednisoLONE acetate (PRED FORTE) 1 % ophthalmic suspension Place 1 drop into the right eye 4 (four) times daily.   VITAMIN D PO Take 1 capsule by mouth daily.   ketorolac (ACULAR) 0.5 % ophthalmic solution Place 1 drop into the right eye 4 (four) times daily. (Patient not taking: Reported on 02/17/2023)   ofloxacin (OCUFLOX) 0.3 % ophthalmic solution Place 1 drop into the right eye 4 (four) times daily. (Patient not taking: Reported on 02/17/2023)   No facility-administered encounter medications on file as of 02/17/2023.     Today's Vitals   02/17/23 1113 02/17/23 1118 02/17/23 1124  BP: (!) 143/80 133/77   Pulse: 72    Resp: 16    Temp: 98.3 F (36.8 C)    TempSrc: Oral    SpO2: 99%    Weight: 96 lb 8 oz (43.8 kg)    Height: 5\' 2"  (1.575 m)    PainSc:   0-No pain   Body mass index is 17.65 kg/m.   PHYSICAL EXAM GENERAL:alert, no distress and comfortable SKIN: no rash  EYES: sclera clear NECK: without mass LYMPH:  no palpable cervical or supraclavicular lymphadenopathy  LUNGS: clear with normal breathing effort HEART: regular rate & rhythm, no lower extremity edema ABDOMEN: abdomen soft, non-tender and normal bowel sounds NEURO: alert & oriented x 3 with fluent speech, no focal motor/sensory deficits Breast exam: no palpable mass in either breast or axilla that I could appreciate. Benign   CBC    Component Value Date/Time   WBC 3.8 (L) 02/17/2023 1212   WBC 2.8 (L) 02/07/2023 0530   RBC 4.43 02/17/2023 1212   HGB 13.7 02/17/2023 1212   HCT 41.1 02/17/2023 1212   PLT 179 02/17/2023 1212   MCV 92.8 02/17/2023 1212   MCH 30.9 02/17/2023 1212   MCHC 33.3 02/17/2023 1212   RDW 12.3 02/17/2023 1212   LYMPHSABS 1.1 02/17/2023 1212   MONOABS 0.3 02/17/2023 1212   EOSABS 0.1 02/17/2023 1212   BASOSABS 0.0 02/17/2023 1212     CMP     Component Value Date/Time   NA 140 02/17/2023 1212   K 3.9 02/17/2023 1212   CL 103 02/17/2023 1212   CO2 30 02/17/2023  1212   GLUCOSE 102 (H) 02/17/2023 1212   BUN 12 02/17/2023 1212   CREATININE 0.80 02/17/2023 1212   CALCIUM 9.3 02/17/2023 1212   PROT 7.1 02/17/2023 1212   ALBUMIN 4.4 02/17/2023 1212   AST 21 02/17/2023 1212   ALT 21 02/17/2023 1212   ALKPHOS 44 02/17/2023 1212   BILITOT 0.7 02/17/2023 1212   GFRNONAA >60 02/17/2023 1212   GFRAA >60 10/27/2019 0944     ASSESSMENT & PLAN::Kaylaann G Nixon is a 72 y.o. female   Altered  mental status/encephalopathy -Occurred 2 days after cataract surgery, she had an acute GI episode with N/V/D and syncope, brought to ED and admitted 8/8 - 8/9. -Labs showed K3.4, normal urine, drug screen, CK, TSH, ammonia, no alcohol -EKG showed sinus rhythm, chest x-ray, CT head, and brain MRI all negative -Labs prior to discharge showed mild pancytopenia WBC 2.8, Hgb 11.6, platelet 137, T. bili 1.3.  -Ms. Huffaker appears well, has returned to baseline. Unclear if the episode was secondary to anesthesia, GI illness, vasovagal response, or other. -  Lab abnormalities in the hospital have normalized today - WBC 3.8 (baseline range 3.4 - 8.3), normal hgb, plt, and Tbili.  -Likely needs no further work up at this time  2. 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  S/p 5/6 cycles of neoadjuvant TCHP and only 5 months of maintenance Herceptin per patient request.  She underwent right lumpectomy by Dr. Donell Beers 05/19/2019 with a complete pathologic response, and completed adjuvant radiation. -Due to ER/PR negative disease, she would not benefit from antiestrogen therapy -Bilateral mammogram 01/06/2023 negative for malignancy, density cat C. -Ms. Noah is clinically doing well. Exam is benign, overall no clinical concern for recurrence -Continue surveillance -F/up in 6 months, or sooner if needed   3. Basal skin cancer: R nares, L posterior neck  base/upper back, L leg -[facility], she has been referred to derm -Reviewed skin protection   4.  Bone health  -Continue vitamin D, calcium-rich diet, and weightbearing exercise -Continue to recommend routine DEXA screening, she will discuss with her PCP   5.  Health maintenance -She completed 4 COVID vaccines but declined a flu shot in Fall 2022 flu season -Last colonoscopy 12/2017 by Dr. Lavon Paganini which showed hemorrhoids.  She has had intermittent hemorrhoidal bleeding in the past. Next due 04/2023 -Encouraged her to continue healthy active lifestyle and age-appropriate health maintenance     PLAN: [facility] course, recent mammogram, and today's labs reviewed -Continue breast cancer surveillance -F/up in 6 months, or sooner if needed   All questions were answered. The patient knows to call the clinic with any problems, questions or concerns. No barriers to learning were detected. I spent 20 minutes counseling the patient face to face. The total time spent in the appointment was 30 minutes and more than 50% was on counseling, review of test results, and coordination of care.   Santiago Glad, NP-C 02/17/2023

## 2023-02-17 ENCOUNTER — Inpatient Hospital Stay: Payer: Medicare HMO | Attending: Nurse Practitioner | Admitting: Nurse Practitioner

## 2023-02-17 ENCOUNTER — Encounter: Payer: Self-pay | Admitting: Nurse Practitioner

## 2023-02-17 ENCOUNTER — Inpatient Hospital Stay: Payer: Medicare HMO

## 2023-02-17 VITALS — BP 133/77 | HR 72 | Temp 98.3°F | Resp 16 | Ht 62.0 in | Wt 96.5 lb

## 2023-02-17 DIAGNOSIS — C50411 Malignant neoplasm of upper-outer quadrant of right female breast: Secondary | ICD-10-CM

## 2023-02-17 DIAGNOSIS — Z87442 Personal history of urinary calculi: Secondary | ICD-10-CM | POA: Diagnosis not present

## 2023-02-17 DIAGNOSIS — D61818 Other pancytopenia: Secondary | ICD-10-CM | POA: Diagnosis not present

## 2023-02-17 DIAGNOSIS — Z79899 Other long term (current) drug therapy: Secondary | ICD-10-CM | POA: Diagnosis not present

## 2023-02-17 DIAGNOSIS — I10 Essential (primary) hypertension: Secondary | ICD-10-CM | POA: Diagnosis not present

## 2023-02-17 DIAGNOSIS — Z171 Estrogen receptor negative status [ER-]: Secondary | ICD-10-CM | POA: Insufficient documentation

## 2023-02-17 DIAGNOSIS — G9340 Encephalopathy, unspecified: Secondary | ICD-10-CM | POA: Diagnosis not present

## 2023-02-17 DIAGNOSIS — I341 Nonrheumatic mitral (valve) prolapse: Secondary | ICD-10-CM | POA: Insufficient documentation

## 2023-02-17 DIAGNOSIS — Z923 Personal history of irradiation: Secondary | ICD-10-CM | POA: Diagnosis not present

## 2023-02-17 LAB — CMP (CANCER CENTER ONLY)
ALT: 21 U/L (ref 0–44)
AST: 21 U/L (ref 15–41)
Albumin: 4.4 g/dL (ref 3.5–5.0)
Alkaline Phosphatase: 44 U/L (ref 38–126)
Anion gap: 7 (ref 5–15)
BUN: 12 mg/dL (ref 8–23)
CO2: 30 mmol/L (ref 22–32)
Calcium: 9.3 mg/dL (ref 8.9–10.3)
Chloride: 103 mmol/L (ref 98–111)
Creatinine: 0.8 mg/dL (ref 0.44–1.00)
GFR, Estimated: >60 mL/min (ref >60)
Glucose, Bld: 102 mg/dL — ABNORMAL HIGH (ref 70–99)
Potassium: 3.9 mmol/L (ref 3.5–5.1)
Sodium: 140 mmol/L (ref 135–145)
Total Bilirubin: 0.7 mg/dL (ref 0.3–1.2)
Total Protein: 7.1 g/dL (ref 6.5–8.1)

## 2023-02-17 LAB — CBC WITH DIFFERENTIAL (CANCER CENTER ONLY)
Abs Immature Granulocytes: 0 10*3/uL (ref 0.00–0.07)
Basophils Absolute: 0 10*3/uL (ref 0.0–0.1)
Basophils Relative: 1 %
Eosinophils Absolute: 0.1 10*3/uL (ref 0.0–0.5)
Eosinophils Relative: 2 %
HCT: 41.1 % (ref 36.0–46.0)
Hemoglobin: 13.7 g/dL (ref 12.0–15.0)
Immature Granulocytes: 0 %
Lymphocytes Relative: 30 %
Lymphs Abs: 1.1 10*3/uL (ref 0.7–4.0)
MCH: 30.9 pg (ref 26.0–34.0)
MCHC: 33.3 g/dL (ref 30.0–36.0)
MCV: 92.8 fL (ref 80.0–100.0)
Monocytes Absolute: 0.3 10*3/uL (ref 0.1–1.0)
Monocytes Relative: 7 %
Neutro Abs: 2.3 10*3/uL (ref 1.7–7.7)
Neutrophils Relative %: 60 %
Platelet Count: 179 10*3/uL (ref 150–400)
RBC: 4.43 MIL/uL (ref 3.87–5.11)
RDW: 12.3 % (ref 11.5–15.5)
WBC Count: 3.8 10*3/uL — ABNORMAL LOW (ref 4.0–10.5)
nRBC: 0 % (ref 0.0–0.2)

## 2023-02-19 ENCOUNTER — Telehealth: Payer: Self-pay | Admitting: Nurse Practitioner

## 2023-02-26 ENCOUNTER — Encounter: Payer: Self-pay | Admitting: Hematology

## 2023-02-27 DIAGNOSIS — H2512 Age-related nuclear cataract, left eye: Secondary | ICD-10-CM | POA: Diagnosis not present

## 2023-03-04 DIAGNOSIS — H25812 Combined forms of age-related cataract, left eye: Secondary | ICD-10-CM | POA: Diagnosis not present

## 2023-03-07 ENCOUNTER — Other Ambulatory Visit: Payer: Self-pay

## 2023-03-07 ENCOUNTER — Ambulatory Visit (AMBULATORY_SURGERY_CENTER): Payer: Medicare HMO

## 2023-03-07 VITALS — Ht 62.0 in | Wt 100.0 lb

## 2023-03-07 DIAGNOSIS — Z800 Family history of malignant neoplasm of digestive organs: Secondary | ICD-10-CM

## 2023-03-07 MED ORDER — NA SULFATE-K SULFATE-MG SULF 17.5-3.13-1.6 GM/177ML PO SOLN
1.0000 | Freq: Once | ORAL | 0 refills | Status: AC
Start: 2023-03-07 — End: 2023-03-07

## 2023-03-07 NOTE — Progress Notes (Signed)
Denies allergies to eggs or soy products. Denies complication of anesthesia or sedation. Denies use of weight loss medication. Denies use of O2.   Emmi instructions given for colonoscopy.  

## 2023-03-11 ENCOUNTER — Other Ambulatory Visit: Payer: Self-pay | Admitting: Pharmacist

## 2023-03-27 ENCOUNTER — Encounter: Payer: Self-pay | Admitting: Gastroenterology

## 2023-04-02 ENCOUNTER — Encounter: Payer: Self-pay | Admitting: Certified Registered Nurse Anesthetist

## 2023-04-04 ENCOUNTER — Encounter: Payer: Self-pay | Admitting: Gastroenterology

## 2023-04-04 ENCOUNTER — Ambulatory Visit (AMBULATORY_SURGERY_CENTER): Payer: Medicare HMO | Admitting: Gastroenterology

## 2023-04-04 VITALS — BP 135/77 | HR 78 | Temp 98.4°F | Resp 15 | Ht 62.0 in | Wt 100.0 lb

## 2023-04-04 DIAGNOSIS — K5010 Crohn's disease of large intestine without complications: Secondary | ICD-10-CM | POA: Diagnosis not present

## 2023-04-04 DIAGNOSIS — I10 Essential (primary) hypertension: Secondary | ICD-10-CM | POA: Diagnosis not present

## 2023-04-04 DIAGNOSIS — Z800 Family history of malignant neoplasm of digestive organs: Secondary | ICD-10-CM | POA: Diagnosis not present

## 2023-04-04 DIAGNOSIS — K649 Unspecified hemorrhoids: Secondary | ICD-10-CM

## 2023-04-04 DIAGNOSIS — Z1211 Encounter for screening for malignant neoplasm of colon: Secondary | ICD-10-CM

## 2023-04-04 DIAGNOSIS — K639 Disease of intestine, unspecified: Secondary | ICD-10-CM | POA: Diagnosis not present

## 2023-04-04 DIAGNOSIS — K6289 Other specified diseases of anus and rectum: Secondary | ICD-10-CM | POA: Diagnosis not present

## 2023-04-04 DIAGNOSIS — J45909 Unspecified asthma, uncomplicated: Secondary | ICD-10-CM | POA: Diagnosis not present

## 2023-04-04 DIAGNOSIS — K633 Ulcer of intestine: Secondary | ICD-10-CM

## 2023-04-04 MED ORDER — SODIUM CHLORIDE 0.9 % IV SOLN: 500.0000 mL | Freq: Once | INTRAVENOUS | Status: DC

## 2023-04-04 MED ORDER — HYDROCORTISONE ACETATE 25 MG RE SUPP
25.0000 mg | Freq: Every day | RECTAL | 1 refills | Status: DC
Start: 2023-04-04 — End: 2023-08-26

## 2023-04-04 NOTE — Patient Instructions (Addendum)
-  Handout on hemorrhoids provided -await pathology results -repeat colonoscopy in 5 years for surveillance recommended.  -Continue present medications  hydrocortisone suppository called in to you pharmacy. Take one per rectum daily for 1 week   YOU HAD AN ENDOSCOPIC PROCEDURE TODAY AT THE [facility]:   Refer to the procedure report that was given to you for any specific questions about what was found during the examination.  If the procedure report does not answer your questions, please call your gastroenterologist to clarify.  If you requested that your care partner not be given the details of your procedure findings, then the procedure report has been included in a sealed envelope for you to review at your convenience later.  YOU SHOULD EXPECT: Some feelings of bloating in the abdomen. Passage of more gas than usual.  Walking can help get rid of the air that was put into your GI tract during the procedure and reduce the bloating. If you had a lower endoscopy (such as a colonoscopy or flexible sigmoidoscopy) you may notice spotting of blood in your stool or on the toilet paper. If you underwent a bowel prep for your procedure, you may not have a normal bowel movement for a few days.  Please Note:  You might notice some irritation and congestion in your nose or some drainage.  This is from the oxygen used during your procedure.  There is no need for concern and it should clear up in a day or so.  SYMPTOMS TO REPORT IMMEDIATELY:  Following lower endoscopy (colonoscopy or flexible sigmoidoscopy):  Excessive amounts of blood in the stool  Significant tenderness or worsening of abdominal pains  Swelling of the abdomen that is new, acute  Fever of 100F or higher   For urgent or emergent issues, a gastroenterologist can be reached at any hour by calling [phone]. Do not use MyChart messaging for urgent concerns.    DIET:  We do recommend a small meal at first, but then you may  proceed to your regular diet.  Drink plenty of fluids but you should avoid alcoholic beverages for 24 hours.  ACTIVITY:  You should plan to take it easy for the rest of today and you should NOT DRIVE or use heavy machinery until tomorrow (because of the sedation medicines used during the test).    FOLLOW UP: Our staff will call the number listed on your records the next business day following your procedure.  We will call around 7:15- 8:00 am to check on you and address any questions or concerns that you may have regarding the information given to you following your procedure. If we do not reach you, we will leave a message.     If any biopsies were taken you will be contacted by phone or by letter within the next 1-3 weeks.  Please call us at [phone] if you have not heard about the biopsies in 3 weeks.    SIGNATURES/CONFIDENTIALITY: You and/or your care partner have signed paperwork which will be entered into your electronic medical record.  These signatures attest to the fact that that the information above on your After Visit Summary has been reviewed and is understood.  Full responsibility of the confidentiality of this discharge information lies with you and/or your care-partner.

## 2023-04-04 NOTE — Progress Notes (Signed)
[facility] History and Physical   Primary Care Physician:  Merri Brunette, MD   Reason for Procedure:  Family history of colon cancer  Plan:    Screening colonoscopy with possible interventions as needed     HPI: Christina Sexton is a very pleasant 72 y.o. female here for screening colonoscopy. Denies any nausea, vomiting, abdominal pain, melena or bright red blood per rectum  The risks and benefits as well as alternatives of endoscopic procedure(s) have been discussed and reviewed. All questions answered. The patient agrees to proceed.    Past Medical History:  Diagnosis Date   Allergy    Anemia    Asthma    as a child    Cancer (HCC)    Breast-R   Cataract    Chronic kidney disease    Heart murmur    MVP   History of kidney stones     x 3 passed 1   History of radiation therapy 06/16/19- 07/15/19   Right Breast 15 fractions of 2.67 Gy to total 40.05 Gy. Right Breast boost 5 fractions of 2 Gy to total 10 Gy   Hyperlipidemia    Hypertension    Kidney stone    Mitral valve prolapse    Osteoporosis     Past Surgical History:  Procedure Laterality Date   BREAST BIOPSY Left 12/27/2015   benign   BREAST BIOPSY Right 07/26/2013   benign   BREAST EXCISIONAL BIOPSY Right 09/08/2013   high risk    BREAST LUMPECTOMY WITH RADIOACTIVE SEED AND SENTINEL LYMPH NODE BIOPSY Right 05/19/2019   Procedure: BREAST LUMPECTOMY WITH RADIOACTIVE SEED X2  AND SENTINEL LYMPH NODE BIOPSY;  Surgeon: Almond Lint, MD;  Location: [facility] OR;  Service: General;  Laterality: Right;   COLONOSCOPY W/ POLYPECTOMY     CYSTOSCOPY     PORT-A-CATH REMOVAL Left 12/07/2019   Procedure: REMOVAL PORT-A-CATH;  Surgeon: Almond Lint, MD;  Location: [facility] OR;  Service: General;  Laterality: Left;   PORTACATH PLACEMENT N/A 12/28/2018   Procedure: INSERTION PORT-A-CATH;  Surgeon: Almond Lint, MD;  Location: [facility];  Service: General;  Laterality: N/A;   SHOULDER SURGERY Right    frozen shoulder and  bone spur    Prior to Admission medications   Medication Sig Start Date End Date Taking? Authorizing Provider  atorvastatin (LIPITOR) 20 MG tablet Take 20 mg by mouth daily.   Yes [provider]  VITAMIN D PO Take 1 capsule by mouth daily.   Yes [provider]    Current Outpatient Medications  Medication Sig Dispense Refill   atorvastatin (LIPITOR) 20 MG tablet Take 20 mg by mouth daily.     VITAMIN D PO Take 1 capsule by mouth daily.     Current Facility-Administered Medications  Medication Dose Route Frequency Provider Last Rate Last Admin   0.9 %  sodium chloride infusion  500 mL Intravenous Once Napoleon Form, MD        Allergies as of 04/04/2023 - Review Complete 04/04/2023  Allergen Reaction Noted   Oxycodone Other (See Comments) 12/06/2019    Family History  Problem Relation Age of Onset   Colon cancer Brother    COPD Mother    Diabetes Father    Esophageal cancer Neg Hx    Liver cancer Neg Hx    Pancreatic cancer Neg Hx    Rectal cancer Neg Hx    Stomach cancer Neg Hx     Social History   Socioeconomic History  Marital status: Single    Spouse name: Not on file   Number of children: Not on file   Years of education: Not on file   Highest education level: Not on file  Occupational History   Not on file  Tobacco Use   Smoking status: Former   Smokeless tobacco: Never   Tobacco comments:    socially none for 40 years- recorded 12/06/2019  Vaping Use   Vaping status: Never Used  Substance and Sexual Activity   Alcohol use: Yes    Comment: rarely   Drug use: No   Sexual activity: Not on file  Other Topics Concern   Not on file  Social History Narrative   Not on file   Social Determinants of Health   Financial Resource Strain: Not on file  Food Insecurity: No Food Insecurity (02/06/2023)   Hunger Vital Sign    Worried About Running Out of Food in the Last Year: Never true    Ran Out of Food in the Last Year: Never true   Transportation Needs: No Transportation Needs (02/06/2023)   PRAPARE - Administrator, Civil Service (Medical): No    Lack of Transportation (Non-Medical): No  Physical Activity: Not on file  Stress: Not on file  Social Connections: Not on file  Intimate Partner Violence: Not At Risk (02/06/2023)   Humiliation, Afraid, Rape, and Kick questionnaire    Fear of Current or Ex-Partner: No    Emotionally Abused: No    Physically Abused: No    Sexually Abused: No    Review of Systems:  All other review of systems negative except as mentioned in the HPI.  Physical Exam: Vital signs in last 24 hours: BP (!) 165/88   Pulse 83   Temp 98.4 F (36.9 C) (Temporal)   Resp 16   Ht 5\' 2"  (1.575 m)   Wt 100 lb (45.4 kg)   SpO2 99%   BMI 18.29 kg/m  General:   Alert, NAD Lungs:  Clear .   Heart:  Regular rate and rhythm Abdomen:  Soft, nontender and nondistended. Neuro/Psych:  Alert and cooperative. Normal mood and affect. A and O x 3  Reviewed labs, radiology imaging, old records and pertinent past GI work up  Patient is appropriate for planned procedure(s) and anesthesia in an ambulatory setting   K. Scherry Ran , MD [phone]

## 2023-04-04 NOTE — Progress Notes (Signed)
Pt's states no medical or surgical changes since previsit or office visit. 

## 2023-04-04 NOTE — Progress Notes (Signed)
Report given to PACU, vss 

## 2023-04-04 NOTE — Op Note (Signed)
[facility] Patient Name: Christina Sexton Procedure Date: 04/04/2023 10:58 AM MRN: [id number] Endoscopist: Napoleon Form , MD, [id number] Age: 72 Referring MD:  Date of Birth: 06/25/1951 Gender: Female Account #: [id number] Procedure:                Colonoscopy Indications:              Screening patient at increased risk: Family history                            of 1st-degree relative with colorectal cancer at                            age 18 years (or older) Medicines:                Monitored Anesthesia Care Procedure:                Pre-Anesthesia Assessment:                           - Prior to the procedure, a History and Physical                            was performed, and patient medications and                            allergies were reviewed. The patient's tolerance of                            previous anesthesia was also reviewed. The risks                            and benefits of the procedure and the sedation                            options and risks were discussed with the patient.                            All questions were answered, and informed consent                            was obtained. Prior Anticoagulants: The patient has                            taken no anticoagulant or antiplatelet agents. ASA                            Grade Assessment: II - A patient with mild systemic                            disease. After reviewing the risks and benefits,                            the patient was deemed in satisfactory condition to  undergo the procedure.                           After obtaining informed consent, the colonoscope                            was passed under direct vision. Throughout the                            procedure, the patient's blood pressure, pulse, and                            oxygen saturations were monitored continuously. The                            Olympus PCF-H190DL  (#[id number]) Colonoscope was                            introduced through the anus and advanced to the the                            cecum, identified by appendiceal orifice and                            ileocecal valve. The colonoscopy was performed                            without difficulty. The patient tolerated the                            procedure well. The quality of the bowel                            preparation was good. The ileocecal valve,                            appendiceal orifice, and rectum were photographed. Scope In: 11:07:40 AM Scope Out: 11:22:50 AM Scope Withdrawal Time: 0 hours 10 minutes 49 seconds  Total Procedure Duration: 0 hours 15 minutes 10 seconds  Findings:                 The perianal and digital rectal examinations were                            normal.                           Discontinuous areas of nonbleeding ulcerated mucosa                            with stigmata of recent bleeding were present in                            the descending colon. Biopsies were taken with a  cold forceps for histology.                           Rest of the exam was otherwise normal throughout                            the examined right colon, sigmoid colon and rectum.                            Biopsies were taken with a cold forceps for                            histology.                           Non-bleeding external and internal hemorrhoids were                            found during retroflexion. The hemorrhoids were                            large. Complications:            No immediate complications. Estimated Blood Loss:     Estimated blood loss was minimal. Impression:               - Mucosal ulceration in the descending colon.                            Biopsied.                           - Non-bleeding external and internal hemorrhoids. Recommendation:           - Resume previous diet.                            - Continue present medications.                           - Await pathology results.                           - Repeat colonoscopy in 5 years for surveillance.                           - Return to GI clinic at the next available                            appointment.                           - Use hydrocortisone suppository 25 mg 1 per rectum                            once a day for 1 week. Napoleon Form, MD 04/04/2023 11:31:05 AM This report has been signed electronically.

## 2023-04-04 NOTE — Progress Notes (Signed)
Called to room to assist during endoscopic procedure.  Patient ID and intended procedure confirmed with present staff. Received instructions for my participation in the procedure from the performing physician.  

## 2023-04-07 ENCOUNTER — Telehealth: Payer: Self-pay

## 2023-04-07 NOTE — Telephone Encounter (Signed)
  Follow up Call-     04/04/2023   10:15 AM  Call back number  Post procedure Call Back phone  # [phone]  Permission to leave phone message Yes     Patient questions:  Do you have a fever, pain , or abdominal swelling? No. Pain Score  0 *  Have you tolerated food without any problems? Yes.    Have you been able to return to your normal activities? Yes.    Do you have any questions about your discharge instructions: Diet   No. Medications  No. Follow up visit  No.  Do you have questions or concerns about your Care? No.  Actions: * If pain score is 4 or above: No action needed, pain <4.

## 2023-04-08 LAB — SURGICAL PATHOLOGY

## 2023-04-22 ENCOUNTER — Other Ambulatory Visit: Payer: Medicare HMO

## 2023-04-22 ENCOUNTER — Ambulatory Visit: Payer: Medicare HMO | Admitting: Nurse Practitioner

## 2023-04-30 ENCOUNTER — Encounter: Payer: Self-pay | Admitting: Gastroenterology

## 2023-05-21 ENCOUNTER — Encounter: Payer: Self-pay | Admitting: Hematology

## 2023-05-21 NOTE — Telephone Encounter (Signed)
Telephone call  

## 2023-08-05 ENCOUNTER — Ambulatory Visit (INDEPENDENT_AMBULATORY_CARE_PROVIDER_SITE_OTHER): Payer: Medicare HMO | Admitting: Gastroenterology

## 2023-08-05 ENCOUNTER — Encounter: Payer: Self-pay | Admitting: Gastroenterology

## 2023-08-05 VITALS — BP 122/70 | HR 76 | Ht 62.0 in | Wt 99.0 lb

## 2023-08-05 DIAGNOSIS — K529 Noninfective gastroenteritis and colitis, unspecified: Secondary | ICD-10-CM

## 2023-08-05 DIAGNOSIS — K5010 Crohn's disease of large intestine without complications: Secondary | ICD-10-CM

## 2023-08-05 DIAGNOSIS — K641 Second degree hemorrhoids: Secondary | ICD-10-CM | POA: Diagnosis not present

## 2023-08-05 NOTE — Progress Notes (Signed)
 Christina Sexton    [id number]    1950/08/11  Primary Care Physician:Pharr, Ryan, MD  Referring Physician: Clarice Ryan, MD [address] [place],  [place] [address]   Chief complaint: Hemorrhoids Discussed the use of AI scribe software for clinical note transcription with the patient, who gave verbal consent to proceed.  History of Present Illness   Christina Sexton is a 73 year old very pleasant female who presents for follow-up after a colonoscopy with findings of nonspecific ulceration in the descending colon.  She underwent a colonoscopy in October, which revealed ulceration on the left side of the colon. Biopsies were taken, and the results were nonspecific, not indicating ulcerative colitis or inflammatory bowel disease. Potential causes of the ulceration include medication use and ischemic colitis due to dehydration from bowel prep. She was not taking medications like ibuprofen, Aleve, or Advil that could increase risk.  No current symptoms such as diarrhea, stomach pains, or bleeding. During the review of symptoms, she denies experiencing diarrhea, stomach pains, or bleeding.  She has a family history of colon cancer that necessitates colonoscopy every five years.  She complains of symptomatic hemorrhoids with intermittent swelling and difficulty evacuating     Colonoscopy April 04, 2023:  - Mucosal ulceration in the descending colon.                            Biopsied.                           - Non-bleeding external and internal hemorrhoids. 1. Surgical [P], random right colon :      COLONIC MUCOSA WITH GRANULATION TISSUE TYPE STROMA AND REACTIVE EPITHELIAL      CHANGES.      NEGATIVE FOR DYSPLASIA OR MALIGNANCY.       2. Surgical [P], random left colon hx :      COLONIC MUCOSA WITH GRANULATION TISSUE TYPE STROMA AND REACTIVE EPITHELIAL      CHANGES.      NEGATIVE FOR DYSPLASIA OR MALIGNANCY.      SEE NOTE.       3. Surgical [P],  colon, rectum :      COLONIC MUCOSA WITH HYPERPLASTIC CHANGES.      NEGATIVE FOR DYSPLASIA.       Diagnosis Note : The biopsies from the right and left colon contain fragments of      colonic mucosa with focal granulation tissue type stroma and reactive epithelial      changes.The features are not entirely specific and could be associated with      infection, medication effect, ischemic injury, diverticular disease associated      colitis, et al.Correlation with clinical information is recommended   Outpatient Encounter Medications as of 08/05/2023  Medication Sig   atorvastatin  (LIPITOR) 20 MG tablet Take 20 mg by mouth daily.   VITAMIN D PO Take 1 capsule by mouth daily.   hydrocortisone  (ANUSOL -HC) 25 MG suppository Place 1 suppository (25 mg total) rectally daily. One per rectum once a day for 1 week. (Patient not taking: Reported on 08/05/2023)   No facility-administered encounter medications on file as of 08/05/2023.    Allergies as of 08/05/2023 - Review Complete 08/05/2023  Allergen Reaction Noted   Oxycodone  Other (See Comments) 12/06/2019    Past Medical History:  Diagnosis Date   Allergy  Anemia    Asthma    as a child    Cancer (HCC)    Breast-R   Cataract    Chronic kidney disease    Heart murmur    MVP   History of kidney stones     x 3 passed 1   History of radiation therapy 06/16/19- 07/15/19   Right Breast 15 fractions of 2.67 Gy to total 40.05 Gy. Right Breast boost 5 fractions of 2 Gy to total 10 Gy   Hyperlipidemia    Hypertension    Kidney stone    Mitral valve prolapse    Osteoporosis     Past Surgical History:  Procedure Laterality Date   BREAST BIOPSY Left 12/27/2015   benign   BREAST BIOPSY Right 07/26/2013   benign   BREAST EXCISIONAL BIOPSY Right 09/08/2013   high risk    BREAST LUMPECTOMY WITH RADIOACTIVE SEED AND SENTINEL LYMPH NODE BIOPSY Right 05/19/2019   Procedure: BREAST LUMPECTOMY WITH RADIOACTIVE SEED X2  AND SENTINEL LYMPH NODE  BIOPSY;  Surgeon: Aron Shoulders, MD;  Location: [facility] OR;  Service: General;  Laterality: Right;   COLONOSCOPY W/ POLYPECTOMY     CYSTOSCOPY     PORT-A-CATH REMOVAL Left 12/07/2019   Procedure: REMOVAL PORT-A-CATH;  Surgeon: Aron Shoulders, MD;  Location: [facility] OR;  Service: General;  Laterality: Left;   PORTACATH PLACEMENT N/A 12/28/2018   Procedure: INSERTION PORT-A-CATH;  Surgeon: Aron Shoulders, MD;  Location: [facility] OR[facility];  Service: General;  Laterality: N/A;   SHOULDER SURGERY Right    frozen shoulder and bone spur    Family History  Problem Relation Age of Onset   Colon cancer Brother    COPD Mother    Diabetes Father    Esophageal cancer Neg Hx    Liver cancer Neg Hx    Pancreatic cancer Neg Hx    Rectal cancer Neg Hx    Stomach cancer Neg Hx     Social History   Socioeconomic History   Marital status: Single    Spouse name: Not on file   Number of children: Not on file   Years of education: Not on file   Highest education level: Not on file  Occupational History   Not on file  Tobacco Use   Smoking status: Former   Smokeless tobacco: Never   Tobacco comments:    socially none for 40 years- recorded 12/06/2019  Vaping Use   Vaping status: Never Used  Substance and Sexual Activity   Alcohol use: Yes    Comment: rarely   Drug use: No   Sexual activity: Not on file  Other Topics Concern   Not on file  Social History Narrative   Not on file   Social Drivers of Health   Financial Resource Strain: Not on file  Food Insecurity: No Food Insecurity (02/06/2023)   Hunger Vital Sign    Worried About Running Out of Food in the Last Year: Never true    Ran Out of Food in the Last Year: Never true  Transportation Needs: No Transportation Needs (02/06/2023)   PRAPARE - Administrator, Civil Service (Medical): No    Lack of Transportation (Non-Medical): No  Physical Activity: Not on file  Stress: Not on file  Social Connections: Not on file  Intimate Partner Violence: Not  At Risk (02/06/2023)   Humiliation, Afraid, Rape, and Kick questionnaire    Fear of Current or Ex-Partner: No    Emotionally Abused: No  Physically Abused: No    Sexually Abused: No      Review of systems: All other review of systems negative except as mentioned in the HPI.   Physical Exam: Vitals:   08/05/23 0820  BP: 122/70  Pulse: 76   Body mass index is 18.11 kg/m. Gen:      No acute distress HEENT:  sclera anicteric Abd:      soft, non-tender; no palpable masses, no distension Ext:    No edema Neuro: alert and oriented x 3 Psych: normal mood and affect  Data Reviewed:  Reviewed labs, radiology imaging, old records and pertinent past GI work up     Assessment and Plan PROCEDURE NOTE: The patient presents with symptomatic grade II  hemorrhoids, requesting rubber band ligation of his/her hemorrhoidal disease.  All risks, benefits and alternative forms of therapy were described and informed consent was obtained.  In the Left Lateral Decubitus position anoscopic examination revealed grade II hemorrhoids in the left lateral, right posterior and right anterior position(s).  The anorectum was pre-medicated with 0.125% NTG and Recticare. The decision was made to band the left lateral internal hemorrhoid, and the [facility] O'Regan System was used to perform band ligation without complication.  Digital anorectal examination was then performed to assure proper positioning of the band, and to adjust the banded tissue as required.  The patient was discharged home without pain or other issues.  Dietary and behavioral recommendations were given and along with follow-up instructions.     No complications were encountered and the patient tolerated the procedure well.       Colon Ulceration/colitis based on colonoscopy Previous colonoscopy in October showed nonspecific ulceration on the left colon. Biopsies ruled out ulcerative colitis, inflammatory bowel disease, and infection.  Possible causes include medication use or ischemic colitis from dehydration during bowel prep. Condition appears resolved as patient reports no symptoms (diarrhea, abdominal pain, bleeding). - Continue current management without new medications - Schedule next colonoscopy in 2029 due to family history.       The patient was provided an opportunity to ask questions and all were answered. The patient agreed with the plan and demonstrated an understanding of the instructions.  LOIS Wilkie Mcgee , MD    CC: Clarice Nottingham, MD

## 2023-08-05 NOTE — Patient Instructions (Addendum)

## 2023-08-07 ENCOUNTER — Other Ambulatory Visit: Payer: Self-pay

## 2023-08-07 NOTE — Progress Notes (Signed)
 Emailed [place] [facility] a Request for Service Form for pt's upcoming appts on 08/26/2023.  Receipt confirmation received.  Awaiting renewal approval from [facility] Oncology Team.

## 2023-08-25 ENCOUNTER — Other Ambulatory Visit: Payer: Self-pay

## 2023-08-25 DIAGNOSIS — Z171 Estrogen receptor negative status [ER-]: Secondary | ICD-10-CM

## 2023-08-26 ENCOUNTER — Inpatient Hospital Stay: Payer: Medicare HMO | Admitting: Nurse Practitioner

## 2023-08-26 ENCOUNTER — Encounter: Payer: Self-pay | Admitting: Nurse Practitioner

## 2023-08-26 ENCOUNTER — Inpatient Hospital Stay: Payer: Medicare HMO | Attending: Hematology

## 2023-08-26 VITALS — BP 140/88 | HR 70 | Temp 97.8°F | Resp 17 | Wt 101.9 lb

## 2023-08-26 DIAGNOSIS — Z853 Personal history of malignant neoplasm of breast: Secondary | ICD-10-CM | POA: Diagnosis not present

## 2023-08-26 DIAGNOSIS — Z923 Personal history of irradiation: Secondary | ICD-10-CM | POA: Diagnosis not present

## 2023-08-26 DIAGNOSIS — Z9221 Personal history of antineoplastic chemotherapy: Secondary | ICD-10-CM | POA: Diagnosis not present

## 2023-08-26 DIAGNOSIS — Z1722 Progesterone receptor negative status: Secondary | ICD-10-CM | POA: Insufficient documentation

## 2023-08-26 DIAGNOSIS — C50411 Malignant neoplasm of upper-outer quadrant of right female breast: Secondary | ICD-10-CM

## 2023-08-26 DIAGNOSIS — Z171 Estrogen receptor negative status [ER-]: Secondary | ICD-10-CM

## 2023-08-26 LAB — CMP (CANCER CENTER ONLY)
ALT: 20 U/L (ref 0–44)
AST: 21 U/L (ref 15–41)
Albumin: 4.6 g/dL (ref 3.5–5.0)
Alkaline Phosphatase: 46 U/L (ref 38–126)
Anion gap: 6 (ref 5–15)
BUN: 12 mg/dL (ref 8–23)
CO2: 31 mmol/L (ref 22–32)
Calcium: 10 mg/dL (ref 8.9–10.3)
Chloride: 103 mmol/L (ref 98–111)
Creatinine: 0.82 mg/dL (ref 0.44–1.00)
GFR, Estimated: >60 mL/min (ref >60)
Glucose, Bld: 106 mg/dL — ABNORMAL HIGH (ref 70–99)
Potassium: 3.9 mmol/L (ref 3.5–5.1)
Sodium: 140 mmol/L (ref 135–145)
Total Bilirubin: 0.9 mg/dL (ref 0.0–1.2)
Total Protein: 7 g/dL (ref 6.5–8.1)

## 2023-08-26 LAB — CBC WITH DIFFERENTIAL (CANCER CENTER ONLY)
Abs Immature Granulocytes: 0.01 10*3/uL (ref 0.00–0.07)
Basophils Absolute: 0 10*3/uL (ref 0.0–0.1)
Basophils Relative: 1 %
Eosinophils Absolute: 0 10*3/uL (ref 0.0–0.5)
Eosinophils Relative: 1 %
HCT: 40.6 % (ref 36.0–46.0)
Hemoglobin: 13.4 g/dL (ref 12.0–15.0)
Immature Granulocytes: 0 %
Lymphocytes Relative: 29 %
Lymphs Abs: 1 10*3/uL (ref 0.7–4.0)
MCH: 30.5 pg (ref 26.0–34.0)
MCHC: 33 g/dL (ref 30.0–36.0)
MCV: 92.3 fL (ref 80.0–100.0)
Monocytes Absolute: 0.4 10*3/uL (ref 0.1–1.0)
Monocytes Relative: 10 %
Neutro Abs: 2.1 10*3/uL (ref 1.7–7.7)
Neutrophils Relative %: 59 %
Platelet Count: 164 10*3/uL (ref 150–400)
RBC: 4.4 MIL/uL (ref 3.87–5.11)
RDW: 12.8 % (ref 11.5–15.5)
WBC Count: 3.6 10*3/uL — ABNORMAL LOW (ref 4.0–10.5)
nRBC: 0 % (ref 0.0–0.2)

## 2023-08-26 NOTE — Progress Notes (Signed)
 Patient Care Team: Merri Brunette, MD as PCP - General (Internal Medicine) Pershing Proud, RN as Oncology Nurse Navigator Donnelly Angelica, RN as Oncology Nurse Navigator Almond Lint, MD as Consulting Physician (General Surgery) Malachy Mood, MD as Consulting Physician (Hematology) Lonie Peak, MD as Attending Physician (Radiation Oncology)   CHIEF COMPLAINT: Follow-up right breast cancer  Oncology History Overview Note  Cancer Staging Malignant neoplasm of upper-outer quadrant of right female breast [facility]) Staging form: Breast, AJCC 8th Edition - Clinical stage from 12/09/2018: Stage IIA (cT2, cN0, cM0, G3, ER-, PR-, HER2+) - Signed by Malachy Mood, MD on 12/15/2018 - Pathologic stage from 05/19/2019: No Stage Recommended (ypT0, pN0, cM0, GX, ER: Not Assessed, PR: Not Assessed, HER2: Not Assessed) - Signed by Malachy Mood, MD on 06/01/2019    Malignant neoplasm of upper-outer quadrant of right female breast (HCC)  12/07/2018 Mammogram   Mammogram 12/07/18  IMPRESSION: 1. 3 cm mass in the 11 o'clock position of the right breast, 4 cm the nipple, measuring 3 x 2 x 2 cm, highly suspicious for breast carcinoma. No enlarged or abnormal right axillary lymph nodes.   12/09/2018 Cancer Staging   Staging form: Breast, AJCC 8th Edition - Clinical stage from 12/09/2018: Stage IIA (cT2, cN0, cM0, G3, ER-, PR-, HER2+) - Signed by Malachy Mood, MD on 12/15/2018   12/09/2018 Initial Biopsy   Diagnosis 12/09/18 Breast, right, needle core biopsy, 11 o'clock - INVASIVE DUCTAL CARCINOMA. - DUCTAL CARCINOMA IN SITU. - SEE COMMENT.   12/09/2018 Receptors her2   The tumor cells are POSITIVE for Her2 (3+). Estrogen Receptor: 0%, NEGATIVE Progesterone Receptor: 0%, NEGATIVE Proliferation Marker Ki67: 30%   12/15/2018 Initial Diagnosis   Malignant neoplasm of upper-outer quadrant of right female breast (HCC)   12/21/2018 Breast MRI   IMPRESSION: 1. Approximate 4 cm mass involving the UPPER RIGHT breast  with extension into the UPPER INNER QUADRANT and UPPER OUTER QUADRANT, predominantly located in the UPPER INNER QUADRANT, biopsy-proven invasive ductal carcinoma and DCIS. The mass abuts the chest wall though I cannot confirm pectoralis muscle invasion. 2. Superficial satellite mass versus pathologic intramammary lymph node in the UPPER OUTER QUADRANT of the RIGHT breast measuring approximately 1 cm, located LATERAL to the dominant mass. 3. No MRI evidence of malignancy involving the LEFT breast. 4. No pathologic lymphadenopathy otherwise.   12/24/2018 Pathology Results   Diagnosis Breast, right, needle core biopsy, upper outer quadrant, peripheral to the recently biopsy proven breast carcinoma - INVASIVE DUCTAL CARCINOMA. SEE COMMENT. Results: IMMUNOHISTOCHEMICAL AND MORPHOMETRIC ANALYSIS PERFORMED MANUALLY The tumor cells are POSITIVE for Her2 (3+). Estrogen Receptor: 0%, NEGATIVE Progesterone Receptor: 0%, NEGATIVE Proliferation Marker Ki67: 40%   01/06/2019 - 10/25/2019 Chemotherapy   Neoadjuvant chemotherapy TCHP beginning 01/06/19 completed 5 cycle of TCHP on 03/30/19.  She started maintenance Herceptin on 05/04/19. D/c Perjeta after 05/04/19 due to low benefit. Stopped Herceptin after 10/25/19 due to patient request.    04/15/2019 Breast MRI   IMPRESSION: 1. Interval resolution of the 2 enhancing masses corresponding with biopsy proven malignancy in the upper right breast. There is no residual or new, suspicious enhancement. 2. No MRI evidence of malignancy in the left breast.   04/15/2019 Echocardiogram   FINDINGS  Left Ventricle: Left ventricular ejection fraction, by visual estimation, is 55 to 60%. The left ventricle has normal function. There is no left ventricular hypertrophy. Normal left ventricular size. Spectral Doppler shows Left ventricular diastolic  parameters were normal pattern of LV diastolic filling. Compared with  the echo 12/22/18, systolic function is slightly worse,  especially in the anteroseptum. However overall strain is slightly better.     05/19/2019 Surgery   BREAST LUMPECTOMY WITH RADIOACTIVE SEED X2  AND SENTINEL LYMPH NODE BIOPSY by Dr. Donell Beers  05/19/19    05/19/2019 Pathology Results   FINAL MICROSCOPIC DIAGNOSIS:   A. BREAST, RIGHT LATERAL SATELLITE LESION, LUMPECTOMY:  - No residual invasive carcinoma status post neoadjuvant treatment.  - One intraparenchymal lymph node, negative for carcinoma (0/1).  - See oncology table.   B. BREAST, RIGHT DOMINANT MASS, LUMPECTOMY:  - No residual invasive carcinoma status post neoadjuvant treatment.  - Biopsy site changes.  - See oncology table.   C. SENTINEL LYMPH NODE, RIGHT AXILLARY, EXCISION:  - One lymph node, negative for carcinoma (0/1).   D. SENTINEL LYMPH NODE, RIGHT AXILLARY, EXCISION:  - One lymph node, negative for carcinoma (0/1).   E. SENTINEL LYMPH NODE, RIGHT AXILLARY, EXCISION:  - One lymph node, negative for carcinoma (0/1).   F. SENTINEL LYMPH NODE, RIGHT AXILLARY, EXCISION:  - One lymph node, negative for carcinoma (0/1).    05/19/2019 Cancer Staging   Staging form: Breast, AJCC 8th Edition - Pathologic stage from 05/19/2019: No Stage Recommended (ypT0, pN0, cM0, GX, ER: Not Assessed, PR: Not Assessed, HER2: Not Assessed) - Signed by Malachy Mood, MD on 06/01/2019   06/16/2019 - 07/15/2019 Radiation Therapy   Adjuvant Radiation with Dr. Basilio Cairo  06/16/2019 through 07/15/2019      CURRENT THERAPY: Surveillance  INTERVAL HISTORY Christina Sexton returns for follow-up as scheduled, last seen by me 02/17/2023.  Doing well overall with no changes in her health or specific concerns today.  Denies breast changes, unintentional weight loss, extreme fatigue, bone pain, or any other new complaints.  ROS  All other systems reviewed and negative  Past Medical History:  Diagnosis Date   Allergy    Anemia    Asthma    as a child    Cancer (HCC)    Breast-R   Cataract    Chronic  kidney disease    Heart murmur    MVP   History of kidney stones     x 3 passed 1   History of radiation therapy 06/16/19- 07/15/19   Right Breast 15 fractions of 2.67 Gy to total 40.05 Gy. Right Breast boost 5 fractions of 2 Gy to total 10 Gy   Hyperlipidemia    Hypertension    Kidney stone    Mitral valve prolapse    Osteoporosis      Past Surgical History:  Procedure Laterality Date   BREAST BIOPSY Left 12/27/2015   benign   BREAST BIOPSY Right 07/26/2013   benign   BREAST EXCISIONAL BIOPSY Right 09/08/2013   high risk    BREAST LUMPECTOMY WITH RADIOACTIVE SEED AND SENTINEL LYMPH NODE BIOPSY Right 05/19/2019   Procedure: BREAST LUMPECTOMY WITH RADIOACTIVE SEED X2  AND SENTINEL LYMPH NODE BIOPSY;  Surgeon: Almond Lint, MD;  Location: [facility];  Service: General;  Laterality: Right;   COLONOSCOPY W/ POLYPECTOMY     CYSTOSCOPY     PORT-A-CATH REMOVAL Left 12/07/2019   Procedure: REMOVAL PORT-A-CATH;  Surgeon: Almond Lint, MD;  Location: [facility] OR;  Service: General;  Laterality: Left;   PORTACATH PLACEMENT N/A 12/28/2018   Procedure: INSERTION PORT-A-CATH;  Surgeon: Almond Lint, MD;  Location: [facility];  Service: General;  Laterality: N/A;   SHOULDER SURGERY Right    frozen shoulder and bone spur  Outpatient Encounter Medications as of 08/26/2023  Medication Sig   atorvastatin (LIPITOR) 20 MG tablet Take 20 mg by mouth daily.   Multiple Vitamins-Minerals (WOMENS MULTIVITAMIN PO) Take 1 tablet by mouth daily.   VITAMIN D PO Take 1 capsule by mouth daily.   [DISCONTINUED] hydrocortisone (ANUSOL-HC) 25 MG suppository Place 1 suppository (25 mg total) rectally daily. One per rectum once a day for 1 week. (Patient not taking: Reported on 08/05/2023)   No facility-administered encounter medications on file as of 08/26/2023.     Today's Vitals   08/26/23 1128  BP: (!) 140/88  Pulse: 70  Resp: 17  Temp: 97.8 F (36.6 C)  TempSrc: Temporal  SpO2: 95%  Weight: 101 lb 14.4 oz (46.2  kg)  PainSc: 0-No pain   Body mass index is 18.64 kg/m.   PHYSICAL EXAM GENERAL:alert, no distress and comfortable SKIN: no rash  EYES: sclera clear NECK: without mass LYMPH:  no palpable cervical or supraclavicular lymphadenopathy  LUNGS: clear with normal breathing effort HEART: regular rate & rhythm, no lower extremity edema ABDOMEN: abdomen soft, non-tender and normal bowel sounds NEURO: alert & oriented x 3 with fluent speech, no focal motor/sensory deficits Breast exam: No palpable mass or nodularity in either breast or axilla that I could appreciate   CBC    Component Value Date/Time   WBC 3.6 (L) 08/26/2023 1103   WBC 2.8 (L) 02/07/2023 0530   RBC 4.40 08/26/2023 1103   HGB 13.4 08/26/2023 1103   HCT 40.6 08/26/2023 1103   PLT 164 08/26/2023 1103   MCV 92.3 08/26/2023 1103   MCH 30.5 08/26/2023 1103   MCHC 33.0 08/26/2023 1103   RDW 12.8 08/26/2023 1103   LYMPHSABS 1.0 08/26/2023 1103   MONOABS 0.4 08/26/2023 1103   EOSABS 0.0 08/26/2023 1103   BASOSABS 0.0 08/26/2023 1103     CMP     Component Value Date/Time   NA 140 02/17/2023 1212   K 3.9 02/17/2023 1212   CL 103 02/17/2023 1212   CO2 30 02/17/2023 1212   GLUCOSE 102 (H) 02/17/2023 1212   BUN 12 02/17/2023 1212   CREATININE 0.80 02/17/2023 1212   CALCIUM 9.3 02/17/2023 1212   PROT 7.1 02/17/2023 1212   ALBUMIN 4.4 02/17/2023 1212   AST 21 02/17/2023 1212   ALT 21 02/17/2023 1212   ALKPHOS 44 02/17/2023 1212   BILITOT 0.7 02/17/2023 1212   GFRNONAA >60 02/17/2023 1212   GFRAA >60 10/27/2019 0944     ASSESSMENT & PLAN:Christina Sexton is a 73 y.o. 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  S/p 5/6 cycles of neoadjuvant TCHP and only 5 months of maintenance Herceptin per patient request.  She underwent right lumpectomy by Dr. Donell Beers 05/19/2019 with a complete  pathologic response, and completed adjuvant radiation. -Due to ER/PR negative disease, she would not benefit from antiestrogen therapy -Bilateral mammogram 01/06/2023 negative for malignancy, density cat C. -Christina Sexton is clinically doing well.  Exam is benign, labs are unremarkable, overall no clinical concern for recurrence -Continue surveillance, she understands the risk of late recurrence in HER2 + breast cancer -26-month follow-up, or sooner if needed   Bone health  -Continue vitamin D, calcium-rich diet, and weightbearing exercise -Continue to recommend routine DEXA screening, she will discuss with her PCP   Health maintenance -Last colonoscopy 04/2023 by Dr. Lavon Paganini, q5 years -Encouraged  her to continue healthy active lifestyle and age-appropriate health maintenance     PLAN: -Labs reviewed -Continue breast cancer surveillance -Mammo in July -Follow-up in 6 months, or sooner if needed  Orders Placed This Encounter  Procedures   MM DIAG BREAST TOMO BILATERAL    Standing Status:   Future    Expected Date:   01/07/2024    Expiration Date:   08/25/2024    Reason for Exam (SYMPTOM  OR DIAGNOSIS REQUIRED):   R breast cancer 11/2018, breast density cat C    Preferred imaging location?:   [facility]      All questions were answered. The patient knows to call the clinic with any problems, questions or concerns. No barriers to learning were detected.   Christina Glad, NP-C 08/26/2023

## 2023-08-27 ENCOUNTER — Telehealth: Payer: Self-pay | Admitting: Nurse Practitioner

## 2023-08-27 ENCOUNTER — Other Ambulatory Visit: Payer: Self-pay | Admitting: Nurse Practitioner

## 2023-08-27 DIAGNOSIS — Z1231 Encounter for screening mammogram for malignant neoplasm of breast: Secondary | ICD-10-CM

## 2023-08-27 NOTE — Telephone Encounter (Signed)
 Left patient a voicemail in regards to scheduled appointment times/dates, left callback number for scheduling line if patient needs to change or cancel appointments

## 2023-09-30 ENCOUNTER — Ambulatory Visit: Payer: Medicare HMO | Admitting: Gastroenterology

## 2023-09-30 ENCOUNTER — Encounter: Payer: Self-pay | Admitting: Hematology

## 2023-09-30 ENCOUNTER — Encounter: Payer: Self-pay | Admitting: Gastroenterology

## 2023-09-30 VITALS — BP 120/68 | HR 76 | Ht 62.0 in | Wt 99.8 lb

## 2023-09-30 DIAGNOSIS — K641 Second degree hemorrhoids: Secondary | ICD-10-CM

## 2023-09-30 NOTE — Progress Notes (Signed)
 PROCEDURE NOTE: The patient presents with symptomatic grade II  hemorrhoids, requesting rubber band ligation of his/her hemorrhoidal disease.  All risks, benefits and alternative forms of therapy were described and informed consent was obtained.   The anorectum was pre-medicated with 0.125% NTG and recticare The decision was made to band the right posterior internal hemorrhoid, and the [facility] O'Regan System was used to perform band ligation without complication.  Digital anorectal examination was then performed to assure proper positioning of the band, and to adjust the banded tissue as required.  The patient was discharged home without pain or other issues.  Dietary and behavioral recommendations were given and along with follow-up instructions.      The patient will return in 1 year for  follow-up for follow-up of ulcerative colitis No complications were encountered and the patient tolerated the procedure well.  Iona Beard , MD [phone]

## 2023-09-30 NOTE — Patient Instructions (Signed)

## 2023-10-09 DIAGNOSIS — Z961 Presence of intraocular lens: Secondary | ICD-10-CM | POA: Diagnosis not present

## 2023-10-09 DIAGNOSIS — H1045 Other chronic allergic conjunctivitis: Secondary | ICD-10-CM | POA: Diagnosis not present

## 2023-10-09 DIAGNOSIS — H26493 Other secondary cataract, bilateral: Secondary | ICD-10-CM | POA: Diagnosis not present

## 2023-10-29 ENCOUNTER — Telehealth: Payer: Self-pay | Admitting: Gastroenterology

## 2023-10-29 NOTE — Telephone Encounter (Signed)
 Inbound call from patient, would like to know if she needs to come in for an appointment or what she can do to subside the rectal bleeding she has been having recently, patient is unsure if she needs to have another banding done. Please advise.

## 2023-10-30 NOTE — Telephone Encounter (Signed)
 Seeing some blood with the stool and some blood on the tissue after her bowel movements. This happens with most but not all bowel movements. She feels a "something that I have to push back in" when she cleans after the bowel movement. She denies any pain, burn or itching. Asks if she needs to have another banding?

## 2023-11-03 NOTE — Telephone Encounter (Signed)
 Patient advised and scheduled.

## 2023-11-03 NOTE — Telephone Encounter (Signed)
 Please bring her in for visit with me for rectal exam and possible hemorrhoid banding next available.  Thank you

## 2023-11-05 ENCOUNTER — Ambulatory Visit: Admitting: Gastroenterology

## 2023-11-21 ENCOUNTER — Encounter: Payer: Self-pay | Admitting: Gastroenterology

## 2023-11-21 ENCOUNTER — Ambulatory Visit: Admitting: Gastroenterology

## 2023-11-21 VITALS — BP 120/62 | HR 71 | Ht 62.0 in | Wt 98.6 lb

## 2023-11-21 DIAGNOSIS — R150 Incomplete defecation: Secondary | ICD-10-CM

## 2023-11-21 DIAGNOSIS — K648 Other hemorrhoids: Secondary | ICD-10-CM

## 2023-11-21 DIAGNOSIS — K602 Anal fissure, unspecified: Secondary | ICD-10-CM

## 2023-11-21 DIAGNOSIS — K625 Hemorrhage of anus and rectum: Secondary | ICD-10-CM | POA: Diagnosis not present

## 2023-11-21 NOTE — Progress Notes (Unsigned)
 Christina Sexton    [id number]    [birth year]  Primary Care Physician:Pharr, Marlou Sims, MD  Referring Physician: Imelda Man, MD [address] [place],  [place] [address]   Chief complaint:  Rectal bleeding    History of Present Illness    60 yr very pleasant female here for follow up after an episode of rectal bleeding last week. She was constipated and had a hard stool subsequently noticed significant rectal bleeding. She is s/p hemorrhoidal band ligation X 2 Denies any excessive straining or rectal discomfort. No abdominal pain, no significant changes in bowel habits other than that 1 episode  Colonoscopy  - Mucosal ulceration in the descending colon. Biopsied.  - Non-bleeding external and internal hemorrhoids.   Outpatient Encounter Medications as of 11/21/2023  Medication Sig   atorvastatin  (LIPITOR) 20 MG tablet Take 20 mg by mouth daily.   Multiple Vitamins-Minerals (WOMENS MULTIVITAMIN PO) Take 1 tablet by mouth daily.   VITAMIN D PO Take 1 capsule by mouth daily.   No facility-administered encounter medications on file as of 11/21/2023.    Allergies as of 11/21/2023 - Review Complete 11/21/2023  Allergen Reaction Noted   Oxycodone  Other (See Comments) 12/06/2019    Past Medical History:  Diagnosis Date   Allergy    Anemia    Asthma    as a child    Cancer (HCC)    Breast-R   Cataract    Chronic kidney disease    Heart murmur    MVP   History of kidney stones     x 3 passed 1   History of radiation therapy 06/16/19- 07/15/19   Right Breast 15 fractions of 2.67 Gy to total 40.05 Gy. Right Breast boost 5 fractions of 2 Gy to total 10 Gy   Hyperlipidemia    Hypertension    Kidney stone    Mitral valve prolapse    Osteoporosis     Past Surgical History:  Procedure Laterality Date   BREAST BIOPSY Left 12/27/2015   benign   BREAST BIOPSY Right 07/26/2013   benign   BREAST EXCISIONAL BIOPSY Right 09/08/2013   high risk     BREAST LUMPECTOMY WITH RADIOACTIVE SEED AND SENTINEL LYMPH NODE BIOPSY Right 05/19/2019   Procedure: BREAST LUMPECTOMY WITH RADIOACTIVE SEED X2  AND SENTINEL LYMPH NODE BIOPSY;  Surgeon: Lockie Rima, MD;  Location: [facility];  Service: General;  Laterality: Right;   COLONOSCOPY W/ POLYPECTOMY     CYSTOSCOPY     PORT-A-CATH REMOVAL Left 12/07/2019   Procedure: REMOVAL PORT-A-CATH;  Surgeon: Lockie Rima, MD;  Location: [facility];  Service: General;  Laterality: Left;   PORTACATH PLACEMENT N/A 12/28/2018   Procedure: INSERTION PORT-A-CATH;  Surgeon: Lockie Rima, MD;  Location: [facility];  Service: General;  Laterality: N/A;   SHOULDER SURGERY Right    frozen shoulder and bone spur    Family History  Problem Relation Age of Onset   Colon cancer Brother    COPD Mother    Diabetes Father    Esophageal cancer Neg Hx    Liver cancer Neg Hx    Pancreatic cancer Neg Hx    Rectal cancer Neg Hx    Stomach cancer Neg Hx     Social History   Socioeconomic History   Marital status: Single    Spouse name: Not on file   Number of children: Not on file   Years of education: Not on  file   Highest education level: Not on file  Occupational History   Not on file  Tobacco Use   Smoking status: Former   Smokeless tobacco: Never   Tobacco comments:    socially none for 40 years- recorded 12/06/2019  Vaping Use   Vaping status: Never Used  Substance and Sexual Activity   Alcohol use: Yes    Comment: rarely   Drug use: No   Sexual activity: Not on file  Other Topics Concern   Not on file  Social History Narrative   Not on file   Social Drivers of Health   Financial Resource Strain: Not on file  Food Insecurity: No Food Insecurity (02/06/2023)   Hunger Vital Sign    Worried About Running Out of Food in the Last Year: Never true    Ran Out of Food in the Last Year: Never true  Transportation Needs: No Transportation Needs (02/06/2023)   PRAPARE - Administrator, Civil Service (Medical):  No    Lack of Transportation (Non-Medical): No  Physical Activity: Not on file  Stress: Not on file  Social Connections: Not on file  Intimate Partner Violence: Not At Risk (02/06/2023)   Humiliation, Afraid, Rape, and Kick questionnaire    Fear of Current or Ex-Partner: No    Emotionally Abused: No    Physically Abused: No    Sexually Abused: No      Review of systems: All other review of systems negative except as mentioned in the HPI.   Physical Exam: Vitals:   11/21/23 1126  BP: 120/62  Pulse: 71   Body mass index is 18.03 kg/m. Gen:      No acute distress HEENT:  sclera anicteric Abd:      soft, non-tender; no palpable masses, no distension Ext:    No edema Neuro: alert and oriented x 3 Psych: normal mood and affect Rectal exam: Normal anal sphincter tone, no anal fissure or external hemorrhoids Anoscopy: Small internal hemorrhoids, + rectal fissure Data Reviewed:  Reviewed labs, radiology imaging, old records and pertinent past GI work up     Assessment and Plan Assessment & Plan Rectal bleeding secondary to rectal fissure Rectal fissure: Use benefiber 1 tablespoon with meals and Miralax 1 capful as needed to prevent constipation  Pelvic floor dysfunction, incomplete evacuation, dyssynergic defecation: Kegel exercise, if no improvement consider referral to pelvic floor physical therapy  Return in 2 months    The patient was provided an opportunity to ask questions and all were answered. The patient agreed with the plan and demonstrated an understanding of the instructions.  Lorena Rolling , MD    CC: Imelda Man, MD

## 2023-11-21 NOTE — Patient Instructions (Signed)
 Kegel Exercises  Kegel exercises can help strengthen your pelvic floor muscles. The pelvic floor is a group of muscles that support your rectum, small intestine, and bladder. In females, pelvic floor muscles also help support the uterus. These muscles help you control the flow of urine and stool (feces). Kegel exercises are painless and simple. They do not require any equipment. Your provider may suggest Kegel exercises to: Improve bladder and bowel control. Improve sexual response. Improve weak pelvic floor muscles after surgery to remove the uterus (hysterectomy) or after pregnancy, in females. Improve weak pelvic floor muscles after prostate gland removal or surgery, in males. Kegel exercises involve squeezing your pelvic floor muscles. These are the same muscles you squeeze when you try to stop the flow of urine or keep from passing gas. The exercises can be done while sitting, standing, or lying down, but it is best to vary your position. Ask your health care provider which exercises are safe for you. Do exercises exactly as told by your health care provider and adjust them as directed. Do not begin these exercises until told by your health care provider. Exercises How to do Kegel exercises: Squeeze your pelvic floor muscles tight. You should feel a tight lift in your rectal area. If you are a female, you should also feel a tightness in your vaginal area. Keep your stomach, buttocks, and legs relaxed. Hold the muscles tight for up to 10 seconds. Breathe normally. Relax your muscles for up to 10 seconds. Repeat as told by your health care provider. Repeat this exercise daily as told by your health care provider. Continue to do this exercise for at least 4-6 weeks, or for as long as told by your health care provider. You may be referred to a physical therapist who can help you learn more about how to do Kegel exercises. Depending on your condition, your health care provider may  recommend: Varying how long you squeeze your muscles. Doing several sets of exercises every day. Doing exercises for several weeks. Making Kegel exercises a part of your regular exercise routine. This information is not intended to replace advice given to you by your health care provider. Make sure you discuss any questions you have with your health care provider. Document Revised: 10/26/2020 Document Reviewed: 10/26/2020 Elsevier Patient Education  2024 [employer].  Due to recent changes in healthcare laws, you may see the results of your imaging and laboratory studies on MyChart before your provider has had a chance to review them.  We understand that in some cases there may be results that are confusing or concerning to you. Not all laboratory results come back in the same time frame and the provider may be waiting for multiple results in order to interpret others.  Please give us  48 hours in order for your provider to thoroughly review all the results before contacting the office for clarification of your results.

## 2024-01-08 ENCOUNTER — Ambulatory Visit
Admission: RE | Admit: 2024-01-08 | Discharge: 2024-01-08 | Disposition: A | Discharge status: Home / Self Care | Payer: Medicare HMO | Source: Ambulatory Visit | Attending: Nurse Practitioner | Admitting: Nurse Practitioner

## 2024-01-08 DIAGNOSIS — Z1231 Encounter for screening mammogram for malignant neoplasm of breast: Secondary | ICD-10-CM

## 2024-01-15 ENCOUNTER — Telehealth: Payer: Self-pay | Admitting: Nurse Practitioner

## 2024-01-15 NOTE — Telephone Encounter (Signed)
 Canceled appointments per the patients request. Confirmed wanting to cancel and not reschedule.

## 2024-01-21 DIAGNOSIS — I10 Essential (primary) hypertension: Secondary | ICD-10-CM | POA: Diagnosis not present

## 2024-01-21 DIAGNOSIS — R5383 Other fatigue: Secondary | ICD-10-CM | POA: Diagnosis not present

## 2024-01-21 DIAGNOSIS — Z0000 Encounter for general adult medical examination without abnormal findings: Secondary | ICD-10-CM | POA: Diagnosis not present

## 2024-01-21 DIAGNOSIS — E7800 Pure hypercholesterolemia, unspecified: Secondary | ICD-10-CM | POA: Diagnosis not present

## 2024-01-21 DIAGNOSIS — E559 Vitamin D deficiency, unspecified: Secondary | ICD-10-CM | POA: Diagnosis not present

## 2024-01-28 DIAGNOSIS — E559 Vitamin D deficiency, unspecified: Secondary | ICD-10-CM | POA: Diagnosis not present

## 2024-01-28 DIAGNOSIS — E7800 Pure hypercholesterolemia, unspecified: Secondary | ICD-10-CM | POA: Diagnosis not present

## 2024-01-28 DIAGNOSIS — Z9221 Personal history of antineoplastic chemotherapy: Secondary | ICD-10-CM | POA: Diagnosis not present

## 2024-01-28 DIAGNOSIS — C50911 Malignant neoplasm of unspecified site of right female breast: Secondary | ICD-10-CM | POA: Diagnosis not present

## 2024-01-28 DIAGNOSIS — M810 Age-related osteoporosis without current pathological fracture: Secondary | ICD-10-CM | POA: Diagnosis not present

## 2024-01-28 DIAGNOSIS — R3129 Other microscopic hematuria: Secondary | ICD-10-CM | POA: Diagnosis not present

## 2024-01-28 DIAGNOSIS — Z0000 Encounter for general adult medical examination without abnormal findings: Secondary | ICD-10-CM | POA: Diagnosis not present

## 2024-02-24 ENCOUNTER — Ambulatory Visit: Payer: Medicare HMO | Admitting: Nurse Practitioner

## 2024-02-24 ENCOUNTER — Other Ambulatory Visit: Payer: Medicare HMO

## 2024-02-25 ENCOUNTER — Encounter: Payer: Self-pay | Admitting: Gastroenterology

## 2024-02-25 ENCOUNTER — Ambulatory Visit: Admitting: Gastroenterology

## 2024-02-25 VITALS — BP 118/64 | HR 75 | Ht 62.0 in | Wt 97.4 lb

## 2024-02-25 DIAGNOSIS — K641 Second degree hemorrhoids: Secondary | ICD-10-CM

## 2024-02-25 DIAGNOSIS — K642 Third degree hemorrhoids: Secondary | ICD-10-CM | POA: Diagnosis not present

## 2024-02-25 DIAGNOSIS — K625 Hemorrhage of anus and rectum: Secondary | ICD-10-CM

## 2024-02-25 DIAGNOSIS — R140 Abdominal distension (gaseous): Secondary | ICD-10-CM

## 2024-02-25 DIAGNOSIS — K623 Rectal prolapse: Secondary | ICD-10-CM

## 2024-02-25 NOTE — Patient Instructions (Addendum)
 HEMORRHOID BANDING PROCEDURE    FOLLOW-UP CARE   The procedure you have had should have been relatively painless since the banding of the area involved does not have nerve endings and there is no pain sensation.  The rubber band cuts off the blood supply to the hemorrhoid and the band may fall off as soon as 48 hours after the banding (the band may occasionally be seen in the toilet bowl following a bowel movement). You may notice a temporary feeling of fullness in the rectum which should respond adequately to plain Tylenol  or Motrin.  Following the banding, avoid strenuous exercise that evening and resume full activity the next day.  A sitz bath (soaking in a warm tub) or bidet is soothing, and can be useful for cleansing the area after bowel movements.     To avoid constipation, take two tablespoons of natural wheat bran, natural oat bran, flax, Benefiber or any over the counter fiber supplement and increase your water intake to 7-8 glasses daily.    Unless you have been prescribed anorectal medication, do not put anything inside your rectum for two weeks: No suppositories, enemas, fingers, etc.  Occasionally, you may have more bleeding than usual after the banding procedure.  This is often from the untreated hemorrhoids rather than the treated one.  Don't be concerned if there is a tablespoon or so of blood.  If there is more blood than this, lie flat with your bottom higher than your head and apply an ice pack to the area. If the bleeding does not stop within a half an hour or if you feel faint, call our office at [phone] or go to the emergency room.  Problems are not common; however, if there is a substantial amount of bleeding, severe pain, chills, fever or difficulty passing urine (very rare) or other problems, you should call us  at [phone] or report to the nearest emergency room.  Do not stay seated continuously for more than 2-3 hours for a day or two after the procedure.   Tighten your buttock muscles 10-15 times every two hours and take 10-15 deep breaths every 1-2 hours.  Do not spend more than a few minutes on the toilet if you cannot empty your bowel; instead re-visit the toilet at a later time.   I appreciate the opportunity to care for you. Thank you for choosing me and [facility],    Dr. Kavitha Nandigam   _______________________________________________________  If your blood pressure at your visit was 140/90 or greater, please contact your primary care physician to follow up on this.  _______________________________________________________  If you are age 71 or older, your body mass index should be between 23-30. Your Body mass index is 17.81 kg/m. If this is out of the aforementioned range listed, please consider follow up with your Primary Care Provider.  If you are age 31 or younger, your body mass index should be between 19-25. Your Body mass index is 17.81 kg/m. If this is out of the aformentioned range listed, please consider follow up with your Primary Care Provider.   ________________________________________________________  The [facility] providers would like to encourage you to use MYCHART to communicate with providers for non-urgent requests or questions.  Due to long hold times on the telephone, sending your provider a message by [facility] may be a faster and more efficient way to get a response.  Please allow 48 business hours for a response.  Please remember that this is for non-urgent  requests.  _______________________________________________________  [facility] is using a team-based approach to care.  Your team is made up of your doctor and two to three APPS. Our APPS (Nurse Practitioners and Physician Assistants) work with your physician to ensure care continuity for you. They are fully qualified to address your health concerns and develop a treatment plan. They communicate directly with your  gastroenterologist to care for you. Seeing the Advanced Practice Practitioners on your physician's team can help you by facilitating care more promptly, often allowing for earlier appointments, access to diagnostic testing, procedures, and other specialty referrals.

## 2024-02-25 NOTE — Progress Notes (Signed)
 Christina Sexton    [id number]    1951-06-15  Primary Care Physician:Pharr, Ryan, MD  Referring Physician: Clarice Ryan, MD [address] [place],  [place] [address]   Chief complaint:  rectal fissure, BRBPR, hemorrhoids   Discussed the use of AI scribe software for clinical note transcription with the patient, who gave verbal consent to proceed.  History of Present Illness Christina Sexton is a 73 year old female with hemorrhoids and rectal prolapse who presents with rectal bleeding.  Rectal bleeding - Ongoing intermittent rectal bleeding during bowel movements, occurring approximately 1-3 times per week - Blood visible in the toilet and sometimes on the stool - No significant pain except for slight pain during bowel movements, which does not persist afterward  Rectal prolapse and hemorrhoids - History of rectal tissue protrusion - History of rectal tear, believed to be related to rectal prolapse - Prior hemorrhoid banding procedures performed twice without significant improvement in symptoms  Bowel habits and dietary fiber intake - Diet includes peanuts, oatmeal, fruits, and vegetables, providing dietary fiber - Inconsistent use of fiber supplements such as Metamucil or Benefiber, sometimes forgetting to take them  Abdominal bloating - Intermittent bloating, described as feeling 'like I'm pregnant'  Pelvic floor exercises - Performing Kegel exercises      Outpatient Encounter Medications as of 02/25/2024  Medication Sig   atorvastatin  (LIPITOR) 20 MG tablet Take 20 mg by mouth daily.   Multiple Vitamins-Minerals (WOMENS MULTIVITAMIN PO) Take 1 tablet by mouth daily.   VITAMIN D PO Take 1 capsule by mouth daily.   No facility-administered encounter medications on file as of 02/25/2024.    Allergies as of 02/25/2024 - Review Complete 02/25/2024  Allergen Reaction Noted   Oxycodone  Other (See Comments) 12/06/2019    Past Medical  History:  Diagnosis Date   Allergy    Anemia    Asthma    as a child    Cancer (HCC)    Breast-R   Cataract    Chronic kidney disease    Heart murmur    MVP   History of kidney stones     x 3 passed 1   History of radiation therapy 06/16/19- 07/15/19   Right Breast 15 fractions of 2.67 Gy to total 40.05 Gy. Right Breast boost 5 fractions of 2 Gy to total 10 Gy   Hyperlipidemia    Hypertension    Kidney stone    Mitral valve prolapse    Osteoporosis     Past Surgical History:  Procedure Laterality Date   BREAST BIOPSY Left 12/27/2015   benign   BREAST BIOPSY Right 07/26/2013   benign   BREAST EXCISIONAL BIOPSY Right 09/08/2013   high risk    BREAST LUMPECTOMY WITH RADIOACTIVE SEED AND SENTINEL LYMPH NODE BIOPSY Right 05/19/2019   Procedure: BREAST LUMPECTOMY WITH RADIOACTIVE SEED X2  AND SENTINEL LYMPH NODE BIOPSY;  Surgeon: Aron Shoulders, MD;  Location: [facility] OR;  Service: General;  Laterality: Right;   COLONOSCOPY W/ POLYPECTOMY     CYSTOSCOPY     PORT-A-CATH REMOVAL Left 12/07/2019   Procedure: REMOVAL PORT-A-CATH;  Surgeon: Aron Shoulders, MD;  Location: [facility] OR;  Service: General;  Laterality: Left;   PORTACATH PLACEMENT N/A 12/28/2018   Procedure: INSERTION PORT-A-CATH;  Surgeon: Aron Shoulders, MD;  Location: [facility] ORS;  Service: General;  Laterality: N/A;   SHOULDER SURGERY Right    frozen shoulder and bone spur  Family History  Problem Relation Age of Onset   Colon cancer Brother    COPD Mother    Diabetes Father    Esophageal cancer Neg Hx    Liver cancer Neg Hx    Pancreatic cancer Neg Hx    Rectal cancer Neg Hx    Stomach cancer Neg Hx     Social History   Socioeconomic History   Marital status: Single    Spouse name: Not on file   Number of children: Not on file   Years of education: Not on file   Highest education level: Not on file  Occupational History   Not on file  Tobacco Use   Smoking status: Former   Smokeless tobacco: Never   Tobacco  comments:    socially none for 40 years- recorded 12/06/2019  Vaping Use   Vaping status: Never Used  Substance and Sexual Activity   Alcohol use: Yes    Comment: rarely   Drug use: No   Sexual activity: Not on file  Other Topics Concern   Not on file  Social History Narrative   Not on file   Social Drivers of Health   Financial Resource Strain: Not on file  Food Insecurity: No Food Insecurity (02/06/2023)   Hunger Vital Sign    Worried About Running Out of Food in the Last Year: Never true    Ran Out of Food in the Last Year: Never true  Transportation Needs: No Transportation Needs (02/06/2023)   PRAPARE - Administrator, Civil Service (Medical): No    Lack of Transportation (Non-Medical): No  Physical Activity: Not on file  Stress: Not on file  Social Connections: Not on file  Intimate Partner Violence: Not At Risk (02/06/2023)   Humiliation, Afraid, Rape, and Kick questionnaire    Fear of Current or Ex-Partner: No    Emotionally Abused: No    Physically Abused: No    Sexually Abused: No      Review of systems: All other review of systems negative except as mentioned in the HPI.   Physical Exam: Vitals:   02/25/24 0906  BP: 118/64  Pulse: 75  SpO2: 99%   Body mass index is 17.81 kg/m. Gen:      No acute distress HEENT:  sclera anicteric Ext:    No edema Neuro: alert and oriented x 3 Psych: normal mood and affect Rectal exam: Normal anal sphincter tone, no anal fissure or external hemorrhoids Anoscopy: Small internal hemorrhoids, no active bleeding, normal dentate line, no visible nodules  Data Reviewed:  Reviewed labs, radiology imaging, old records and pertinent past GI work up     Assessment and Plan Assessment & Plan Rectal prolapse with hemorrhoids  Intermittent rectal bleeding associated with bowel movements, occurring 1-3 times per week, with slight pain. Protrusion of rectal tissue and a healing left-sided fissure with a thin skin  layer, which may reopen. Previous hemorrhoid banding's effectiveness is uncertain. Banding may create scar tissue to prevent protrusion, but excessive prolapse may require surgery. Surgery involves rectopexy and hemorrhoid tissue removal, with associated risks and recovery. Decision between banding and surgery depends on banding effectiveness and her improvement. Banding has minimal risk; surgery has higher risk but potentially greater benefit. - Perform hemorrhoid banding today, focusing on the left side where the protrusion is occurring. - Encourage dietary fiber intake of 20-30 grams per day through diet or supplements like Metamucil or Benefiber. - Advise against heavy lifting, mowing, or gym activities  until Monday. - Continue Kegel exercises to improve sphincter tone. - Consider referral for surgery if banding does not provide improvement.  Abdominal bloating Reports feeling bloated, similar to being pregnant. - Encourage dietary fiber intake to aid bowel movements and potentially reduce bloating.     PROCEDURE NOTE: The patient presents with symptomatic grade II-III  hemorrhoids, requesting rubber band ligation of his/her hemorrhoidal disease.  All risks, benefits and alternative forms of therapy were described and informed consent was obtained.  In the Left Lateral Decubitus position anoscopic examination revealed grade II-III hemorrhoids in the left lateral position(s).  The anorectum was pre-medicated with 0.125% NTG and recticare The decision was made to band the left lateral internal hemorrhoid, and the [facility] O'Regan System was used to perform band ligation without complication.  Digital anorectal examination was then performed to assure proper positioning of the band, and to adjust the banded tissue as required.  The patient was discharged home without pain or other issues.  Dietary and behavioral recommendations were given and along with follow-up instructions.     The following  adjunctive treatments were recommended:  Metamucil or benefiber 1 tablespoon twice daily   The patient will return in 6-8 weeks for  follow-up and possible additional banding as required. No complications were encountered and the patient tolerated the procedure well.    This visit required >40 minutes of patient care (this includes precharting, chart review, review of results, face-to-face time used for counseling as well as treatment plan and follow-up. The patient was provided an opportunity to ask questions and all were answered. The patient agreed with the plan and demonstrated an understanding of the instructions.  LOIS Wilkie Mcgee , MD    CC: Clarice Nottingham, MD

## 2024-05-19 ENCOUNTER — Encounter: Admitting: Gastroenterology

## 2024-05-20 DIAGNOSIS — M810 Age-related osteoporosis without current pathological fracture: Secondary | ICD-10-CM | POA: Diagnosis not present
